# Patient Record
Sex: Male | Born: 1955 | Race: Black or African American | Hispanic: No | Smoking: Current every day smoker
Health system: Southern US, Community
[De-identification: ages and names within clinical notes are randomized; demographics above are authoritative.]

## PROBLEM LIST (undated history)

## (undated) DIAGNOSIS — J439 Emphysema, unspecified: Secondary | ICD-10-CM

## (undated) DIAGNOSIS — M549 Dorsalgia, unspecified: Secondary | ICD-10-CM

## (undated) DIAGNOSIS — C61 Malignant neoplasm of prostate: Secondary | ICD-10-CM

## (undated) DIAGNOSIS — R7303 Prediabetes: Secondary | ICD-10-CM

## (undated) DIAGNOSIS — E876 Hypokalemia: Secondary | ICD-10-CM

## (undated) DIAGNOSIS — J189 Pneumonia, unspecified organism: Secondary | ICD-10-CM

## (undated) DIAGNOSIS — R011 Cardiac murmur, unspecified: Secondary | ICD-10-CM

## (undated) DIAGNOSIS — B1920 Unspecified viral hepatitis C without hepatic coma: Secondary | ICD-10-CM

## (undated) DIAGNOSIS — I350 Nonrheumatic aortic (valve) stenosis: Secondary | ICD-10-CM

## (undated) DIAGNOSIS — I38 Endocarditis, valve unspecified: Secondary | ICD-10-CM

## (undated) DIAGNOSIS — I700 Atherosclerosis of aorta: Secondary | ICD-10-CM

## (undated) DIAGNOSIS — Z720 Tobacco use: Secondary | ICD-10-CM

## (undated) DIAGNOSIS — I10 Essential (primary) hypertension: Secondary | ICD-10-CM

## (undated) HISTORY — PX: BACK SURGERY: SHX140

---

## 2003-08-21 ENCOUNTER — Other Ambulatory Visit: Payer: Self-pay

## 2003-09-14 ENCOUNTER — Other Ambulatory Visit: Payer: Self-pay

## 2003-12-18 ENCOUNTER — Emergency Department: Payer: Self-pay | Admitting: Emergency Medicine

## 2003-12-18 ENCOUNTER — Other Ambulatory Visit: Payer: Self-pay

## 2005-02-27 ENCOUNTER — Emergency Department: Payer: Self-pay | Admitting: Emergency Medicine

## 2005-06-27 ENCOUNTER — Emergency Department: Payer: Self-pay | Admitting: Emergency Medicine

## 2006-08-17 ENCOUNTER — Emergency Department: Payer: Self-pay | Admitting: Emergency Medicine

## 2006-08-17 ENCOUNTER — Other Ambulatory Visit: Payer: Self-pay

## 2007-02-15 ENCOUNTER — Emergency Department: Payer: Self-pay | Admitting: Emergency Medicine

## 2008-06-14 ENCOUNTER — Ambulatory Visit: Payer: Self-pay | Admitting: Internal Medicine

## 2009-05-11 ENCOUNTER — Emergency Department: Payer: Self-pay | Admitting: Emergency Medicine

## 2010-08-15 ENCOUNTER — Emergency Department: Payer: Self-pay | Admitting: Emergency Medicine

## 2012-08-26 ENCOUNTER — Emergency Department (HOSPITAL_COMMUNITY): Payer: Medicaid Other

## 2012-08-26 ENCOUNTER — Encounter (HOSPITAL_COMMUNITY): Payer: Self-pay | Admitting: Emergency Medicine

## 2012-08-26 ENCOUNTER — Emergency Department (HOSPITAL_COMMUNITY)
Admission: EM | Admit: 2012-08-26 | Discharge: 2012-08-26 | Disposition: A | Discharge status: Home / Self Care | Payer: Medicaid Other | Attending: Emergency Medicine | Admitting: Emergency Medicine

## 2012-08-26 DIAGNOSIS — K6289 Other specified diseases of anus and rectum: Secondary | ICD-10-CM | POA: Insufficient documentation

## 2012-08-26 DIAGNOSIS — Z79899 Other long term (current) drug therapy: Secondary | ICD-10-CM | POA: Insufficient documentation

## 2012-08-26 DIAGNOSIS — Z8679 Personal history of other diseases of the circulatory system: Secondary | ICD-10-CM | POA: Insufficient documentation

## 2012-08-26 DIAGNOSIS — F17200 Nicotine dependence, unspecified, uncomplicated: Secondary | ICD-10-CM | POA: Insufficient documentation

## 2012-08-26 DIAGNOSIS — I10 Essential (primary) hypertension: Secondary | ICD-10-CM | POA: Insufficient documentation

## 2012-08-26 DIAGNOSIS — K625 Hemorrhage of anus and rectum: Secondary | ICD-10-CM

## 2012-08-26 DIAGNOSIS — I38 Endocarditis, valve unspecified: Secondary | ICD-10-CM | POA: Insufficient documentation

## 2012-08-26 DIAGNOSIS — C61 Malignant neoplasm of prostate: Secondary | ICD-10-CM | POA: Insufficient documentation

## 2012-08-26 DIAGNOSIS — R1032 Left lower quadrant pain: Secondary | ICD-10-CM | POA: Insufficient documentation

## 2012-08-26 DIAGNOSIS — Z7982 Long term (current) use of aspirin: Secondary | ICD-10-CM | POA: Insufficient documentation

## 2012-08-26 DIAGNOSIS — N400 Enlarged prostate without lower urinary tract symptoms: Secondary | ICD-10-CM | POA: Insufficient documentation

## 2012-08-26 HISTORY — DX: Malignant neoplasm of prostate: C61

## 2012-08-26 HISTORY — DX: Cardiac murmur, unspecified: R01.1

## 2012-08-26 HISTORY — DX: Essential (primary) hypertension: I10

## 2012-08-26 HISTORY — DX: Endocarditis, valve unspecified: I38

## 2012-08-26 LAB — URINALYSIS, ROUTINE W REFLEX MICROSCOPIC
Bilirubin Urine: NEGATIVE
Glucose, UA: NEGATIVE mg/dL
Ketones, ur: NEGATIVE mg/dL
pH: 6 (ref 5.0–8.0)

## 2012-08-26 LAB — COMPREHENSIVE METABOLIC PANEL
ALT: 69 U/L — ABNORMAL HIGH (ref 0–53)
Albumin: 3.8 g/dL (ref 3.5–5.2)
Alkaline Phosphatase: 100 U/L (ref 39–117)
Chloride: 101 mEq/L (ref 96–112)
GFR calc Af Amer: >90 mL/min (ref >90)
Glucose, Bld: 117 mg/dL — ABNORMAL HIGH (ref 70–99)
Potassium: 3.8 mEq/L (ref 3.5–5.1)
Sodium: 137 mEq/L (ref 135–145)
Total Bilirubin: 0.6 mg/dL (ref 0.3–1.2)
Total Protein: 7.6 g/dL (ref 6.0–8.3)

## 2012-08-26 LAB — CBC WITH DIFFERENTIAL/PLATELET
Eosinophils Absolute: 0.2 10*3/uL (ref 0.0–0.7)
Hemoglobin: 18.8 g/dL — ABNORMAL HIGH (ref 13.0–17.0)
Lymphs Abs: 2.9 10*3/uL (ref 0.7–4.0)
MCH: 29.8 pg (ref 26.0–34.0)
Neutro Abs: 3 10*3/uL (ref 1.7–7.7)
Neutrophils Relative %: 45 % (ref 43–77)
Platelets: 169 10*3/uL (ref 150–400)
RBC: 6.31 MIL/uL — ABNORMAL HIGH (ref 4.22–5.81)
WBC: 6.8 10*3/uL (ref 4.0–10.5)

## 2012-08-26 LAB — URINE MICROSCOPIC-ADD ON

## 2012-08-26 LAB — OCCULT BLOOD, POC DEVICE: Fecal Occult Bld: POSITIVE — AB

## 2012-08-26 MED ORDER — ONDANSETRON HCL 4 MG/2ML IJ SOLN
4.0000 mg | Freq: Once | INTRAMUSCULAR | Status: AC
  Administered 2012-08-26: 4 mg via INTRAVENOUS
  Filled 2012-08-26: qty 2

## 2012-08-26 MED ORDER — OXYCODONE-ACETAMINOPHEN 5-325 MG PO TABS: 1.0000 | ORAL_TABLET | Freq: Four times a day (QID) | ORAL | Status: DC | PRN

## 2012-08-26 MED ORDER — IOHEXOL 300 MG/ML  SOLN
50.0000 mL | Freq: Once | INTRAMUSCULAR | Status: AC | PRN
  Administered 2012-08-26: 50 mL via ORAL

## 2012-08-26 MED ORDER — ONDANSETRON HCL 4 MG PO TABS: 4.0000 mg | ORAL_TABLET | Freq: Four times a day (QID) | ORAL | Status: DC

## 2012-08-26 MED ORDER — IOHEXOL 300 MG/ML  SOLN
100.0000 mL | Freq: Once | INTRAMUSCULAR | Status: AC | PRN
  Administered 2012-08-26: 100 mL via INTRAVENOUS

## 2012-08-26 MED ORDER — MORPHINE SULFATE 4 MG/ML IJ SOLN
4.0000 mg | Freq: Once | INTRAMUSCULAR | Status: AC
  Administered 2012-08-26: 4 mg via INTRAVENOUS
  Filled 2012-08-26: qty 1

## 2012-08-26 NOTE — ED Provider Notes (Signed)
Medical screening examination/treatment/procedure(s) were performed by non-physician practitioner and as supervising physician I was immediately available for consultation/collaboration.  Detrich Rakestraw R. Emanual Lamountain, MD 08/26/12 2309 

## 2012-08-26 NOTE — ED Provider Notes (Signed)
History     CSN: [id number]  Arrival date & time 08/26/12  1355   First MD Initiated Contact with Patient 08/26/12 1424      Chief Complaint  Patient presents with  . Rectal Bleeding    (Consider location/radiation/quality/duration/timing/severity/associated sxs/prior treatment) HPI  Zachary Briggs is a 57 y.o.male presenting to the ER with complaints of rectal bleeding x two episodes with rectal pain. The first episode on Monday started when he was constipated and he noticed that there was some blood in the toilet, then today it happened again. He describes the pain as screws in his stomach. He is also having LLQ pain. He is not passing any blood when he is not going to the bathroom. He says that he has precancerous cells and BPH and is being treated at the [facility] by pain medication and close watching.  He is not on chemo or radiation. He has had no recent biopsy to his prostate. He denies weakness, n/v/d, fevers, recent weight loss.    Past Medical History  Diagnosis Date  . Hypertension   . Prostate cancer   . Heart valve disease   . Heart murmur     Past Surgical History  Procedure Laterality Date  . Back surgery      History reviewed. No pertinent family history.  History  Substance Use Topics  . Smoking status: Current Every Day Smoker    Types: Cigarettes  . Smokeless tobacco: Not on file  . Alcohol Use: 2.4 oz/week    4 Cans of beer per week      Review of Systems  Gastrointestinal: Positive for blood in stool and rectal pain.    Allergies  Review of patient's allergies indicates no known allergies.  Home Medications   Current Outpatient Rx  Name  Route  Sig  Dispense  Refill  . amLODipine (NORVASC) 10 MG tablet   Oral   Take 10 mg by mouth daily.         Marland Kitchen aspirin EC 81 MG tablet   Oral   Take 81 mg by mouth daily.         . diclofenac (VOLTAREN) 50 MG EC tablet   Oral   Take 50 mg by mouth 2 (two) times daily.         Marland Kitchen docusate sodium  (COLACE) 100 MG capsule   Oral   Take 100 mg by mouth 2 (two) times daily as needed for constipation.         . hydrochlorothiazide (HYDRODIURIL) 25 MG tablet   Oral   Take 25 mg by mouth daily.         Marland Kitchen lisinopril (PRINIVIL,ZESTRIL) 5 MG tablet   Oral   Take 2.5 mg by mouth daily.         Marland Kitchen morphine (MSIR) 15 MG tablet   Oral   Take 15 mg by mouth every 4 (four) hours as needed for pain.         Marland Kitchen ondansetron (ZOFRAN) 4 MG tablet   Oral   Take 1 tablet (4 mg total) by mouth every 6 (six) hours.   12 tablet   0   . oxyCODONE-acetaminophen (PERCOCET/ROXICET) 5-325 MG per tablet   Oral   Take 1 tablet by mouth every 6 (six) hours as needed for pain.   20 tablet   0     BP 135/85  Pulse 72  Temp(Src) 97.4 F (36.3 C) (Oral)  Resp 20  Ht 5\' 5"  (1.651  m)  Wt 164 lb (74.39 kg)  BMI 27.29 kg/m2  SpO2 98%  Physical Exam  Nursing note and vitals reviewed. Constitutional: He appears well-developed and well-nourished. No distress.  HENT:  Head: Normocephalic and atraumatic.  Eyes: Pupils are equal, round, and reactive to light.  Neck: Normal range of motion. Neck supple.  Cardiovascular: Normal rate and regular rhythm.   Pulmonary/Chest: Effort normal.  Abdominal: Soft. There is tenderness in the left lower quadrant. There is guarding. There is no rigidity, no rebound, no tenderness at McBurney's point and negative Murphy's sign.  Genitourinary: Rectum normal. Rectal exam shows no internal hemorrhoid, no mass, no tenderness and anal tone normal. Prostate is enlarged.  Neurological: He is alert.  Skin: Skin is warm and dry.    ED Course  Procedures (including critical care time)  Labs Reviewed  CBC WITH DIFFERENTIAL - Abnormal; Notable for the following:    RBC 6.31 (*)    Hemoglobin 18.8 (*)    HCT 54.6 (*)    RDW 15.6 (*)    All other components within normal limits  COMPREHENSIVE METABOLIC PANEL - Abnormal; Notable for the following:    Glucose, Bld  117 (*)    AST 48 (*)    ALT 69 (*)    All other components within normal limits  URINALYSIS, ROUTINE W REFLEX MICROSCOPIC - Abnormal; Notable for the following:    Leukocytes, UA SMALL (*)    All other components within normal limits  OCCULT BLOOD, POC DEVICE - Abnormal; Notable for the following:    Fecal Occult Bld POSITIVE (*)    All other components within normal limits  PROTIME-INR  URINE MICROSCOPIC-ADD ON  OCCULT BLOOD X 1 CARD TO LAB, STOOL   Ct Abdomen Pelvis W Contrast  08/26/2012   *RADIOLOGY REPORT*  Clinical Data: Rectal pain/bleeding, prostate cancer  CT ABDOMEN AND PELVIS WITH CONTRAST  Technique:  Multidetector CT imaging of the abdomen and pelvis was performed following the standard protocol during bolus administration of intravenous contrast.  Contrast: 100 ml Omnipaque-300 IV  Comparison: None.  Findings: Minimal dependent atelectasis at the right lung base.  Liver, spleen, pancreas, and left adrenal gland are within normal limits.  Minimal nodular thickening of the right adrenal gland (series 2/image 15), measuring approximately 11 mm, with washout characteristics compatible with an adrenal adenoma.  Gallbladder is unremarkable.  No intrahepatic or extrahepatic ductal dilatation.  Kidneys are within normal limits. No hydronephrosis.  No evidence of bowel obstruction.  Normal appendix.  Extensive colonic diverticulosis, without associated inflammatory changes.  Atherosclerotic calcifications of the abdominal aorta and branch vessels.  No abdominopelvic ascites.  No suspicious abdominopelvic lymphadenopathy.  Prostate is at the upper limits of normal for size, measuring 4.8 cm in transverse dimension.  No ureteral or bladder calculi.  Bladder is within normal limits.  Degenerative changes of the lumbar spine.  10 mm sclerotic lesion in the right iliac bone (series 2/image 56), nonspecific.  IMPRESSION: No evidence of bowel obstruction.  Normal appendix.  Extensive colonic  diverticulosis, without findings to suggest diverticulitis.  10 mm sclerotic lesion in the right iliac bone.  In the setting of prostate cancer, correlate with PSA and consider nuclear medicine bone scan as clinically warranted.  11 mm right adrenal adenoma.   Original Report Authenticated By: Charline Bills, M.D.     1. Rectal bleed       MDM   Patient work-up does not show any findings that require acute management. He is  not actively bleeding in ER. Bleeding occurs during bowel movement. Discussed stool softners and staying hydrated. Rx pain medication. Pt feels much better in the ED.  Discussed with DR. Pickering. [facility].  56 y.o.Bronislaw Noblet's evaluation in the Emergency Department is complete. It has been determined that no acute conditions requiring further emergency intervention are present at this time. The patient/guardian have been advised of the diagnosis and plan. We have discussed signs and symptoms that warrant return to the ED, such as changes or worsening in symptoms.  Vital signs are stable at discharge. Filed Vitals:   08/26/12 1715  BP:   Pulse: 61  Temp:   Resp: 18    Patient/guardian has voiced understanding and agreed to follow-up with the PCP or specialist.        Dorthula Matas, PA-C 08/26/12 1724

## 2012-08-26 NOTE — Progress Notes (Signed)
Pt confirms pcp is  [facility] per pt EPIC updated

## 2012-08-26 NOTE — ED Notes (Signed)
Pt finished with oral contrast.  

## 2012-08-26 NOTE — ED Notes (Addendum)
Pt states he has prostate cancer c/o rectal bleeding and pain in rectum.

## 2012-08-26 NOTE — ED Notes (Signed)
Pt aware of the need for a urine sample. Urinal at bedside. Pt unable to void at this time.

## 2012-08-31 ENCOUNTER — Encounter (HOSPITAL_COMMUNITY): Payer: Self-pay | Admitting: *Deleted

## 2012-08-31 ENCOUNTER — Emergency Department (HOSPITAL_COMMUNITY)
Admission: EM | Admit: 2012-08-31 | Discharge: 2012-08-31 | Disposition: A | Discharge status: Home / Self Care | Payer: Medicaid Other | Attending: Emergency Medicine | Admitting: Emergency Medicine

## 2012-08-31 DIAGNOSIS — Z791 Long term (current) use of non-steroidal anti-inflammatories (NSAID): Secondary | ICD-10-CM | POA: Insufficient documentation

## 2012-08-31 DIAGNOSIS — R1032 Left lower quadrant pain: Secondary | ICD-10-CM | POA: Insufficient documentation

## 2012-08-31 DIAGNOSIS — R011 Cardiac murmur, unspecified: Secondary | ICD-10-CM | POA: Insufficient documentation

## 2012-08-31 DIAGNOSIS — R109 Unspecified abdominal pain: Secondary | ICD-10-CM

## 2012-08-31 DIAGNOSIS — F17200 Nicotine dependence, unspecified, uncomplicated: Secondary | ICD-10-CM | POA: Insufficient documentation

## 2012-08-31 DIAGNOSIS — Z7982 Long term (current) use of aspirin: Secondary | ICD-10-CM | POA: Insufficient documentation

## 2012-08-31 DIAGNOSIS — Z8679 Personal history of other diseases of the circulatory system: Secondary | ICD-10-CM | POA: Insufficient documentation

## 2012-08-31 DIAGNOSIS — Z8546 Personal history of malignant neoplasm of prostate: Secondary | ICD-10-CM | POA: Insufficient documentation

## 2012-08-31 DIAGNOSIS — Z79899 Other long term (current) drug therapy: Secondary | ICD-10-CM | POA: Insufficient documentation

## 2012-08-31 DIAGNOSIS — I10 Essential (primary) hypertension: Secondary | ICD-10-CM | POA: Insufficient documentation

## 2012-08-31 DIAGNOSIS — G8929 Other chronic pain: Secondary | ICD-10-CM | POA: Insufficient documentation

## 2012-08-31 MED ORDER — OXYCODONE-ACETAMINOPHEN 5-325 MG PO TABS: 1.0000 | ORAL_TABLET | Freq: Four times a day (QID) | ORAL | Status: DC | PRN

## 2012-08-31 MED ORDER — ONDANSETRON 8 MG PO TBDP: 8.0000 mg | ORAL_TABLET | Freq: Three times a day (TID) | ORAL | Status: DC | PRN

## 2012-08-31 NOTE — ED Provider Notes (Signed)
History    CSN: [id number] Arrival date & time 08/31/12  1623  First MD Initiated Contact with Patient 08/31/12 1639     No chief complaint on file.  (Consider location/radiation/quality/duration/timing/severity/associated sxs/prior Treatment) HPI Comments: Pt comes in with cc of abd pain. Pt has hx of prostate CA, HTN. States that he has chronic abd pain, beign treated with oro morph 15 mg bid by [place]. Pt started having worsening abd pain 1 week ago, was seen by [place], CT was neg acute process, and patient was discharged with percocet. He returns to the ED as he ran out of pain meds. Pt has persistent abd pain, same type, on the left side. He has an appt set on July 7. Pt is scheduled for colonoscopy later this year as well. No n/v/f/c/uti like sx.   The history is provided by the patient.   Past Medical History  Diagnosis Date  . Hypertension   . Prostate cancer   . Heart valve disease   . Heart murmur    Past Surgical History  Procedure Laterality Date  . Back surgery     No family history on file. History  Substance Use Topics  . Smoking status: Current Every Day Smoker    Types: Cigarettes  . Smokeless tobacco: Not on file  . Alcohol Use: 2.4 oz/week    4 Cans of beer per week    Review of Systems  Constitutional: Negative for activity change and appetite change.  Respiratory: Negative for cough and shortness of breath.   Cardiovascular: Negative for chest pain.  Gastrointestinal: Positive for abdominal pain. Negative for nausea, vomiting and diarrhea.  Genitourinary: Negative for dysuria.    Allergies  Review of patient's allergies indicates no known allergies.  Home Medications   Current Outpatient Rx  Name  Route  Sig  Dispense  Refill  . amLODipine (NORVASC) 10 MG tablet   Oral   Take 10 mg by mouth daily.         Marland Kitchen aspirin EC 81 MG tablet   Oral   Take 81 mg by mouth daily.         . diclofenac (VOLTAREN) 50 MG EC tablet   Oral   Take 50 mg by  mouth 2 (two) times daily.         Marland Kitchen docusate sodium (COLACE) 100 MG capsule   Oral   Take 100 mg by mouth 2 (two) times daily as needed for constipation.         . hydrochlorothiazide (HYDRODIURIL) 25 MG tablet   Oral   Take 25 mg by mouth daily.         Marland Kitchen lisinopril (PRINIVIL,ZESTRIL) 5 MG tablet   Oral   Take 2.5 mg by mouth daily.         Marland Kitchen morphine (MSIR) 15 MG tablet   Oral   Take 15 mg by mouth every 4 (four) hours as needed for pain.         Marland Kitchen ondansetron (ZOFRAN) 4 MG tablet   Oral   Take 1 tablet (4 mg total) by mouth every 6 (six) hours.   12 tablet   0   . oxyCODONE-acetaminophen (PERCOCET/ROXICET) 5-325 MG per tablet   Oral   Take 1 tablet by mouth every 6 (six) hours as needed for pain.   20 tablet   0   . ondansetron (ZOFRAN ODT) 8 MG disintegrating tablet   Oral   Take 1 tablet (8 mg total) by  mouth every 8 (eight) hours as needed for nausea.   20 tablet   0   . oxyCODONE-acetaminophen (PERCOCET/ROXICET) 5-325 MG per tablet   Oral   Take 1 tablet by mouth every 6 (six) hours as needed for pain.   20 tablet   0    BP 134/82  Pulse 80  Temp(Src) 98.2 F (36.8 C) (Oral)  Resp 18  SpO2 96% Physical Exam  Nursing note and vitals reviewed. Constitutional: He is oriented to person, place, and time. He appears well-developed.  HENT:  Head: Normocephalic and atraumatic.  Eyes: Conjunctivae and EOM are normal. Pupils are equal, round, and reactive to light.  Neck: Normal range of motion. Neck supple.  Cardiovascular: Normal rate and regular rhythm.   Pulmonary/Chest: Effort normal and breath sounds normal.  Abdominal: Soft. Bowel sounds are normal. He exhibits no distension. There is tenderness. There is no rebound and no guarding.  LLQ tenderness, no rebound or guarding.  Neurological: He is alert and oriented to person, place, and time.  Skin: Skin is warm.    ED Course  Procedures (including critical care time) Labs Reviewed - No  data to display No results found. 1. Abdominal pain     MDM  Pt comes in with cc of abd pain. Pt has complex pain- he is on high dose pain meds on the routine. We did a NCCSR survey - and it is appreciated that patient gets 100 morphine tabs every month - all from [place], but not this month. He has not gone anywhere besides [place] and the visit here for pain meds. So wewill give him the benefit of doubt and give more pain meds.  I stressed the importance of PCP f/u - and GI f/u for possible colonoscopy - since he has had rectal bleeds, and has prostate CA hx. He understands.   Derwood Kaplan, MD 08/31/12 1805

## 2012-08-31 NOTE — ED Notes (Signed)
Pt reports to ed with c/o abdominal pain, pt reports was seen here last week for same and was given medications however he ran out of his meds and came back to get more. Pt sts he will see his pcp on July 7th. Pt reports LLQ abdominal pain, no n/v/d.

## 2014-10-02 ENCOUNTER — Emergency Department (HOSPITAL_COMMUNITY)
Admission: EM | Admit: 2014-10-02 | Discharge: 2014-10-02 | Disposition: A | Discharge status: Home / Self Care | Payer: Medicaid Other | Attending: Emergency Medicine | Admitting: Emergency Medicine

## 2014-10-02 ENCOUNTER — Encounter (HOSPITAL_COMMUNITY): Payer: Self-pay | Admitting: Emergency Medicine

## 2014-10-02 ENCOUNTER — Emergency Department (HOSPITAL_COMMUNITY): Payer: Medicaid Other

## 2014-10-02 DIAGNOSIS — Z7982 Long term (current) use of aspirin: Secondary | ICD-10-CM | POA: Diagnosis not present

## 2014-10-02 DIAGNOSIS — R011 Cardiac murmur, unspecified: Secondary | ICD-10-CM | POA: Insufficient documentation

## 2014-10-02 DIAGNOSIS — Z79899 Other long term (current) drug therapy: Secondary | ICD-10-CM | POA: Diagnosis not present

## 2014-10-02 DIAGNOSIS — I10 Essential (primary) hypertension: Secondary | ICD-10-CM | POA: Diagnosis not present

## 2014-10-02 DIAGNOSIS — M25552 Pain in left hip: Secondary | ICD-10-CM | POA: Diagnosis not present

## 2014-10-02 DIAGNOSIS — Z791 Long term (current) use of non-steroidal anti-inflammatories (NSAID): Secondary | ICD-10-CM | POA: Diagnosis not present

## 2014-10-02 DIAGNOSIS — M79605 Pain in left leg: Secondary | ICD-10-CM | POA: Insufficient documentation

## 2014-10-02 DIAGNOSIS — Z720 Tobacco use: Secondary | ICD-10-CM | POA: Diagnosis not present

## 2014-10-02 DIAGNOSIS — Z8546 Personal history of malignant neoplasm of prostate: Secondary | ICD-10-CM | POA: Diagnosis not present

## 2014-10-02 MED ORDER — METAXALONE 800 MG PO TABS: 400.0000 mg | ORAL_TABLET | Freq: Three times a day (TID) | ORAL | Status: DC

## 2014-10-02 MED ORDER — METAXALONE 800 MG PO TABS
800.0000 mg | ORAL_TABLET | ORAL | Status: AC
Start: 2014-10-02 — End: 2014-10-02
  Administered 2014-10-02: 800 mg via ORAL
  Filled 2014-10-02: qty 1

## 2014-10-02 MED ORDER — KETOROLAC TROMETHAMINE 30 MG/ML IJ SOLN
30.0000 mg | Freq: Once | INTRAMUSCULAR | Status: AC
  Administered 2014-10-02: 30 mg via INTRAMUSCULAR
  Filled 2014-10-02: qty 1

## 2014-10-02 MED ORDER — OXYCODONE HCL 10 MG PO TABS
5.0000 mg | ORAL_TABLET | ORAL | Status: DC | PRN
Start: 2014-10-02 — End: 2015-12-30

## 2014-10-02 MED ORDER — OXYCODONE HCL 5 MG PO TABS
10.0000 mg | ORAL_TABLET | ORAL | Status: AC
  Administered 2014-10-02: 10 mg via ORAL
  Filled 2014-10-02: qty 2

## 2014-10-02 MED ORDER — HYDROMORPHONE HCL 1 MG/ML IJ SOLN
1.0000 mg | Freq: Once | INTRAMUSCULAR | Status: AC
  Administered 2014-10-02: 1 mg via INTRAMUSCULAR
  Filled 2014-10-02: qty 1

## 2014-10-02 NOTE — ED Provider Notes (Signed)
CSN: [id number]     Arrival date & time 10/02/14  0011 History   First MD Initiated Contact with Patient 10/02/14 0020     Chief Complaint  Patient presents with  . Leg Pain     (Consider location/radiation/quality/duration/timing/severity/associated sxs/prior Treatment) HPI Comments: Patient finished a 28 day course of radiation for prostate cancer on July 18th.  Since that time.  He has been having excruciating pain in his left hip radiating down his left leg all the way to his toes.  Has not taken anything for pain nor called his physician.  He was receiving his treatments at the [facility] hospital.  He denies any fall or trauma to the area or previous pain  Patient is a 59 y.o. male presenting with leg pain. The history is provided by the patient.  Leg Pain Location:  Hip and leg Time since incident:  5 days Hip location:  L hip Leg location:  L leg Pain details:    Quality:  Aching   Radiates to:  L leg   Severity:  Severe   Onset quality:  Gradual   Duration:  5 days   Timing:  Constant   Progression:  Worsening Chronicity:  New Dislocation: no   Prior injury to area:  No Relieved by:  None tried Worsened by:  Nothing tried Ineffective treatments:  None tried Associated symptoms: decreased ROM   Associated symptoms: no numbness, no swelling and no tingling   Risk factors comment:  Prostate cancer with radiation treatments   Past Medical History  Diagnosis Date  . Hypertension   . Prostate cancer   . Heart valve disease   . Heart murmur    Past Surgical History  Procedure Laterality Date  . Back surgery     No family history on file. History  Substance Use Topics  . Smoking status: Current Every Day Smoker    Types: Cigarettes  . Smokeless tobacco: Not on file  . Alcohol Use: 2.4 oz/week    4 Cans of beer per week    Review of Systems    Allergies  Review of patient's allergies indicates no known allergies.  Home Medications   Prior to Admission  medications   Medication Sig Start Date End Date Taking? Authorizing Provider  amLODipine (NORVASC) 10 MG tablet Take 10 mg by mouth daily.    Historical Provider, MD  aspirin EC 81 MG tablet Take 81 mg by mouth daily.    Historical Provider, MD  diclofenac (VOLTAREN) 50 MG EC tablet Take 50 mg by mouth 2 (two) times daily.    Historical Provider, MD  docusate sodium (COLACE) 100 MG capsule Take 100 mg by mouth 2 (two) times daily as needed for constipation.    Historical Provider, MD  hydrochlorothiazide (HYDRODIURIL) 25 MG tablet Take 25 mg by mouth daily.    Historical Provider, MD  lisinopril (PRINIVIL,ZESTRIL) 5 MG tablet Take 2.5 mg by mouth daily.    Historical Provider, MD  metaxalone (SKELAXIN) 800 MG tablet Take 0.5 tablets (400 mg total) by mouth 3 (three) times daily. 10/02/14   Junius Creamer, NP  morphine (MSIR) 15 MG tablet Take 15 mg by mouth every 4 (four) hours as needed for pain.    Historical Provider, MD  ondansetron (ZOFRAN ODT) 8 MG disintegrating tablet Take 1 tablet (8 mg total) by mouth every 8 (eight) hours as needed for nausea. 08/31/12   Varney Biles, MD  ondansetron (ZOFRAN) 4 MG tablet Take 1 tablet (4 mg  total) by mouth every 6 (six) hours. 08/26/12   Tiffany Carlota Raspberry, PA-C  oxyCODONE 10 MG TABS Take 0.5 tablets (5 mg total) by mouth every 4 (four) hours as needed for severe pain. 10/02/14   Junius Creamer, NP  oxyCODONE-acetaminophen (PERCOCET/ROXICET) 5-325 MG per tablet Take 1 tablet by mouth every 6 (six) hours as needed for pain. 08/26/12   Delos Haring, PA-C  oxyCODONE-acetaminophen (PERCOCET/ROXICET) 5-325 MG per tablet Take 1 tablet by mouth every 6 (six) hours as needed for pain. 08/31/12   Ankit Nanavati, MD   BP 176/98 mmHg  Pulse 68  Temp(Src) 97.9 F (36.6 C) (Oral)  Resp 18  Ht 5\' 6"  (1.676 m)  Wt 157 lb (71.215 kg)  BMI 25.35 kg/m2  SpO2 97% Physical Exam  Constitutional: He appears well-developed and well-nourished. He appears distressed.  HENT:    Head: Normocephalic.  Eyes: Pupils are equal, round, and reactive to light.  Neck: Normal range of motion.  Cardiovascular: Normal rate and regular rhythm.   Pulmonary/Chest: Effort normal and breath sounds normal.  Abdominal: Soft. He exhibits no distension. There is no tenderness.  Musculoskeletal: He exhibits tenderness. He exhibits no edema.       Left hip: He exhibits decreased range of motion and tenderness. He exhibits no swelling, no crepitus, no deformity and no laceration.       Legs: Neurological: He is alert.  Skin: Skin is warm. No rash noted. No erythema.  Vitals reviewed.   ED Course  Procedures (including critical care time) Labs Review Labs Reviewed - No data to display  Imaging Review Dg Lumbar Spine Complete  10/02/2014   CLINICAL DATA:  Left leg pain for several days.  EXAM: LUMBAR SPINE - COMPLETE 4+ VIEW  COMPARISON:  None.  FINDINGS: There is straightening of the lumbar lordosis. There is no evidence of lumbar spine fracture. Alignment is normal. Moderate degenerative disc changes are present, greatest at L2-3 and L3-4. No bone lesions or bony destruction evident.  IMPRESSION: Degenerative disc changes. Negative for fracture or skeletal metastasis.   Electronically Signed   By: Andreas Newport M.D.   On: 10/02/2014 01:43   Dg Pelvis 1-2 Views  10/02/2014   CLINICAL DATA:  Left leg pain for several days. Status post radiation of the prostate gland.  EXAM: PELVIS - 1-2 VIEW  COMPARISON:  CT abdomen/pelvis 08/26/2012  FINDINGS: The cortical margins of the bony pelvis are intact. The peripherally sclerotic lesion in the right iliac bone on prior CT is not well seen radiographically. No evidence of destructive lytic lesion. No fracture. Pubic symphysis and sacroiliac joints are congruent. Both femoral heads are well-seated in the respective acetabula. Brachytherapy seeds noted in the prostate gland.  IMPRESSION: No radiographic finding of acute osseous abnormality.    Electronically Signed   By: Jeb Levering M.D.   On: 10/02/2014 01:44     EKG Interpretation None     Patient has been given prescriptions for oxycodone, Skelaxin.  He is also been provided with crutches to assist with ambulation.  He is to follow-up with his primary care physician to discuss this new symptom MDM   Final diagnoses:  Hip pain, acute, left         Junius Creamer, NP 10/02/14 [place], MD 10/02/14 [phone]

## 2014-10-02 NOTE — Discharge Instructions (Signed)
Arthralgia Arthralgia is joint pain. A joint is a place where two bones meet. Joint pain can happen for many reasons. The joint can be bruised, stiff, infected, or weak from aging. Pain usually goes away after resting and taking medicine for soreness.  HOME CARE  Rest the joint as told by your doctor.  Keep the sore joint raised (elevated) for the first 24 hours.  Put ice on the joint area.  Put ice in a plastic bag.  Place a towel between your skin and the bag.  Leave the ice on for 15-20 minutes, 03-04 times a day.  Wear your splint, casting, elastic bandage, or sling as told by your doctor.  Only take medicine as told by your doctor. Do not take aspirin.  Use crutches as told by your doctor. Do not put weight on the joint until told to by your doctor. GET HELP RIGHT AWAY IF:   You have bruising, puffiness (swelling), or more pain.  Your fingers or toes turn blue or start to lose feeling (numb).  Your medicine does not lessen the pain.  Your pain becomes severe.  You have a temperature by mouth above 102 F (38.9 C), not controlled by medicine.  You cannot move or use the joint. MAKE SURE YOU:   Understand these instructions.  Will watch your condition.  Will get help right away if you are not doing well or get worse. Document Released: 02/12/2009 Document Revised: 05/19/2011 Document Reviewed: 02/12/2009 [facility] Patient Information 2015 [place], [place]. This information is not intended to replace advice given to you by your health care provider. Make sure you discuss any questions you have with your health care provider. You've been given prescription for oxycodone and Skelaxin one is in narcotic pain reliever.  The other end.  Muscle relaxer.  Please take these for severe pain.  I would like you to take the Skelaxin.  The muscle relaxer on a regular basis.  I would like you to use the oxycodone sparingly.  Please add ibuprofen to your medication regime.  Please make  an appointment with your primary care physician to discuss this new finding

## 2014-10-02 NOTE — ED Notes (Signed)
Patient with leg pain on the left.  Patient states that it started after radiation on Wednesday.  Patient is unable to walk on his left leg due to the pain.  So swelling noted to left calf.

## 2014-10-02 NOTE — ED Notes (Signed)
Pt. Left with all belongings. Discharge instructions were reviewed and all questions were answered.  

## 2014-10-06 ENCOUNTER — Emergency Department (HOSPITAL_COMMUNITY)
Admission: EM | Admit: 2014-10-06 | Discharge: 2014-10-06 | Disposition: A | Discharge status: Home / Self Care | Payer: Medicaid Other | Attending: Emergency Medicine | Admitting: Emergency Medicine

## 2014-10-06 ENCOUNTER — Encounter (HOSPITAL_COMMUNITY): Payer: Self-pay | Admitting: Emergency Medicine

## 2014-10-06 DIAGNOSIS — M5432 Sciatica, left side: Secondary | ICD-10-CM | POA: Diagnosis not present

## 2014-10-06 DIAGNOSIS — Z720 Tobacco use: Secondary | ICD-10-CM | POA: Diagnosis not present

## 2014-10-06 DIAGNOSIS — Z791 Long term (current) use of non-steroidal anti-inflammatories (NSAID): Secondary | ICD-10-CM | POA: Diagnosis not present

## 2014-10-06 DIAGNOSIS — I10 Essential (primary) hypertension: Secondary | ICD-10-CM | POA: Diagnosis not present

## 2014-10-06 DIAGNOSIS — Z7982 Long term (current) use of aspirin: Secondary | ICD-10-CM | POA: Diagnosis not present

## 2014-10-06 DIAGNOSIS — Z79899 Other long term (current) drug therapy: Secondary | ICD-10-CM | POA: Diagnosis not present

## 2014-10-06 DIAGNOSIS — M79605 Pain in left leg: Secondary | ICD-10-CM | POA: Diagnosis present

## 2014-10-06 DIAGNOSIS — R011 Cardiac murmur, unspecified: Secondary | ICD-10-CM | POA: Insufficient documentation

## 2014-10-06 DIAGNOSIS — Z8546 Personal history of malignant neoplasm of prostate: Secondary | ICD-10-CM | POA: Diagnosis not present

## 2014-10-06 MED ORDER — TRAMADOL HCL 50 MG PO TABS: 50.0000 mg | ORAL_TABLET | Freq: Four times a day (QID) | ORAL | Status: DC | PRN

## 2014-10-06 NOTE — ED Notes (Signed)
Pt. Stated, Im still having left leg paijn since July 20, I was here earlier and given medication and the muscle relaxant I couldn't afford.

## 2014-10-06 NOTE — ED Provider Notes (Signed)
CSN: [id number]     Arrival date & time 10/06/14  1025 History  This chart was scribed for non-physician practitioner, Montine Circle, PA-C, working with Tanna Furry, MD by Ladene Artist, ED Scribe. This patient was seen in room TR10C/TR10C and the patient's care was started at 10:40 AM.   Chief Complaint  Patient presents with  . Leg Pain   The history is provided by the patient. No language interpreter was used.   HPI Comments: Zachary Briggs is a 59 y.o. male, with a h/o HTN and prostate CA, who presents to the Emergency Department with a chief complaint of constant left leg pain for the past 9 days. Pt states that pain initially started when he started radiation therapy for prostate CA, but has been constant for the past 9 days. He describes pain as an aching sensation that radiates from his left hip into his left foot and is exacerbated with movement. Pt was seen on 10/02/14 for the same; XRs obtained that were normal. He was prescribed Oxycodone which has provided temporary relief and Skelaxin which he did not fill due to finances. Pt has an upcoming appointment with his PCP at [facility] hospital on 10/12/14. No h/o DM.   Past Medical History  Diagnosis Date  . Hypertension   . Prostate cancer   . Heart valve disease   . Heart murmur    Past Surgical History  Procedure Laterality Date  . Back surgery     No family history on file. History  Substance Use Topics  . Smoking status: Current Every Day Smoker    Types: Cigarettes  . Smokeless tobacco: Not on file  . Alcohol Use: 2.4 oz/week    4 Cans of beer per week    Review of Systems  Constitutional: Negative for fever and chills.  Gastrointestinal:       No bowel incontinence  Genitourinary:       No urinary incontinence  Musculoskeletal: Positive for myalgias, back pain and arthralgias.  Neurological:       No saddle anesthesia   Allergies  Review of patient's allergies indicates no known allergies.  Home Medications    Prior to Admission medications   Medication Sig Start Date End Date Taking? Authorizing Provider  amLODipine (NORVASC) 10 MG tablet Take 10 mg by mouth daily.    Historical Provider, MD  aspirin EC 81 MG tablet Take 81 mg by mouth daily.    Historical Provider, MD  diclofenac (VOLTAREN) 50 MG EC tablet Take 50 mg by mouth 2 (two) times daily.    Historical Provider, MD  docusate sodium (COLACE) 100 MG capsule Take 100 mg by mouth 2 (two) times daily as needed for constipation.    Historical Provider, MD  hydrochlorothiazide (HYDRODIURIL) 25 MG tablet Take 25 mg by mouth daily.    Historical Provider, MD  lisinopril (PRINIVIL,ZESTRIL) 5 MG tablet Take 2.5 mg by mouth daily.    Historical Provider, MD  metaxalone (SKELAXIN) 800 MG tablet Take 0.5 tablets (400 mg total) by mouth 3 (three) times daily. 10/02/14   Junius Creamer, NP  morphine (MSIR) 15 MG tablet Take 15 mg by mouth every 4 (four) hours as needed for pain.    Historical Provider, MD  ondansetron (ZOFRAN ODT) 8 MG disintegrating tablet Take 1 tablet (8 mg total) by mouth every 8 (eight) hours as needed for nausea. 08/31/12   Varney Biles, MD  ondansetron (ZOFRAN) 4 MG tablet Take 1 tablet (4 mg total) by mouth  every 6 (six) hours. 08/26/12   Tiffany Carlota Raspberry, PA-C  oxyCODONE 10 MG TABS Take 0.5 tablets (5 mg total) by mouth every 4 (four) hours as needed for severe pain. 10/02/14   Junius Creamer, NP  oxyCODONE-acetaminophen (PERCOCET/ROXICET) 5-325 MG per tablet Take 1 tablet by mouth every 6 (six) hours as needed for pain. 08/26/12   Delos Haring, PA-C  oxyCODONE-acetaminophen (PERCOCET/ROXICET) 5-325 MG per tablet Take 1 tablet by mouth every 6 (six) hours as needed for pain. 08/31/12   Ankit Nanavati, MD   BP 158/87 mmHg  Pulse 72  Temp(Src) 98.2 F (36.8 C) (Oral)  Resp 17  Ht 5\' 6"  (1.676 m)  Wt 154 lb (69.854 kg)  BMI 24.87 kg/m2  SpO2 98% Physical Exam  Constitutional: He is oriented to person, place, and time. He appears  well-developed and well-nourished. No distress.  HENT:  Head: Normocephalic and atraumatic.  Eyes: Conjunctivae and EOM are normal. Right eye exhibits no discharge. Left eye exhibits no discharge. No scleral icterus.  Neck: Normal range of motion. Neck supple. No tracheal deviation present.  Cardiovascular: Normal rate, regular rhythm and normal heart sounds.  Exam reveals no gallop and no friction rub.   No murmur heard. Pulmonary/Chest: Effort normal and breath sounds normal. No respiratory distress. He has no wheezes.  Abdominal: Soft. He exhibits no distension. There is no tenderness.  Musculoskeletal: Normal range of motion.  Left lumbar paraspinal muscles tender to palpation, no bony tenderness, step-offs, or gross abnormality or deformity of spine, patient is able to ambulate, moves all extremities  Bilateral great toe extension intact Bilateral plantar/dorsiflexion intact  Neurological: He is alert and oriented to person, place, and time. He has normal reflexes.  Sensation and strength intact bilaterally Symmetrical reflexes  Skin: Skin is warm and dry. He is not diaphoretic.  Psychiatric: He has a normal mood and affect. His behavior is normal. Judgment and thought content normal.  Nursing note and vitals reviewed.  ED Course  Procedures (including critical care time) DIAGNOSTIC STUDIES: Oxygen Saturation is 98% on RA, normal by my interpretation.    COORDINATION OF CARE: 10:47 AM-Discussed treatment plan which includes follow-up with specialist with pt at bedside and pt agreed to plan.   Labs Review Labs Reviewed - No data to display  Imaging Review No results found.   EKG Interpretation None      MDM   Final diagnoses:  Sciatica, left    Patient with back pain.  No neurological deficits and normal neuro exam.  Patient is ambulatory.  No loss of bowel or bladder control.  Doubt cauda equina.  Denies fever,  doubt epidural abscess or other lesion. Recommend back  exercises, stretching, RICE, and will treat with a short course of ultram.  Patient may benefit from PT.  Encouraged the patient that there could be a need for additional workup and/or imaging such as MRI, if the symptoms do not resolve. Patient advised that if the back pain does not resolve, or radiates, this could progress to more serious conditions and is encouraged to follow-up with PCP or orthopedics within 2 weeks.     I personally performed the services described in this documentation, which was scribed in my presence. The recorded information has been reviewed and is accurate.      Montine Circle, PA-C 10/06/14 1054  Tanna Furry, MD 10/13/14 [phone]

## 2014-10-06 NOTE — Discharge Instructions (Signed)
Sciatica Sciatica is pain, weakness, numbness, or tingling along the path of the sciatic nerve. The nerve starts in the lower back and runs down the back of each leg. The nerve controls the muscles in the lower leg and in the back of the knee, while also providing sensation to the back of the thigh, lower leg, and the sole of your foot. Sciatica is a symptom of another medical condition. For instance, nerve damage or certain conditions, such as a herniated disk or bone spur on the spine, pinch or put pressure on the sciatic nerve. This causes the pain, weakness, or other sensations normally associated with sciatica. Generally, sciatica only affects one side of the body. CAUSES   Herniated or slipped disc.  Degenerative disk disease.  A pain disorder involving the narrow muscle in the buttocks (piriformis syndrome).  Pelvic injury or fracture.  Pregnancy.  Tumor (rare). SYMPTOMS  Symptoms can vary from mild to very severe. The symptoms usually travel from the low back to the buttocks and down the back of the leg. Symptoms can include:  Mild tingling or dull aches in the lower back, leg, or hip.  Numbness in the back of the calf or sole of the foot.  Burning sensations in the lower back, leg, or hip.  Sharp pains in the lower back, leg, or hip.  Leg weakness.  Severe back pain inhibiting movement. These symptoms may get worse with coughing, sneezing, laughing, or prolonged sitting or standing. Also, being overweight may worsen symptoms. DIAGNOSIS  Your caregiver will perform a physical exam to look for common symptoms of sciatica. He or she may ask you to do certain movements or activities that would trigger sciatic nerve pain. Other tests may be performed to find the cause of the sciatica. These may include:  Blood tests.  X-rays.  Imaging tests, such as an MRI or CT scan. TREATMENT  Treatment is directed at the cause of the sciatic pain. Sometimes, treatment is not necessary  and the pain and discomfort goes away on its own. If treatment is needed, your caregiver may suggest:  Over-the-counter medicines to relieve pain.  Prescription medicines, such as anti-inflammatory medicine, muscle relaxants, or narcotics.  Applying heat or ice to the painful area.  Steroid injections to lessen pain, irritation, and inflammation around the nerve.  Reducing activity during periods of pain.  Exercising and stretching to strengthen your abdomen and improve flexibility of your spine. Your caregiver may suggest losing weight if the extra weight makes the back pain worse.  Physical therapy.  Surgery to eliminate what is pressing or pinching the nerve, such as a bone spur or part of a herniated disk. HOME CARE INSTRUCTIONS   Only take over-the-counter or prescription medicines for pain or discomfort as directed by your caregiver.  Apply ice to the affected area for 20 minutes, 3-4 times a day for the first 48-72 hours. Then try heat in the same way.  Exercise, stretch, or perform your usual activities if these do not aggravate your pain.  Attend physical therapy sessions as directed by your caregiver.  Keep all follow-up appointments as directed by your caregiver.  Do not wear high heels or shoes that do not provide proper support.  Check your mattress to see if it is too soft. A firm mattress may lessen your pain and discomfort. SEEK IMMEDIATE MEDICAL CARE IF:   You lose control of your bowel or bladder (incontinence).  You have increasing weakness in the lower back, pelvis, buttocks,   or legs.  You have redness or swelling of your back.  You have a burning sensation when you urinate.  You have pain that gets worse when you lie down or awakens you at night.  Your pain is worse than you have experienced in the past.  Your pain is lasting longer than 4 weeks.  You are suddenly losing weight without reason. MAKE SURE YOU:  Understand these  instructions.  Will watch your condition.  Will get help right away if you are not doing well or get worse. Document Released: 02/18/2001 Document Revised: 08/26/2011 Document Reviewed: 07/06/2011 Exit[employer] Patient Information 2015 [employer]. This information is not intended to replace advice given to you by your health care provider. Make sure you discuss any questions you have with your health care provider.  

## 2015-12-28 ENCOUNTER — Encounter (HOSPITAL_COMMUNITY): Payer: Self-pay | Admitting: Nurse Practitioner

## 2015-12-28 ENCOUNTER — Observation Stay (HOSPITAL_COMMUNITY)
Admission: EM | Admit: 2015-12-28 | Discharge: 2015-12-30 | Disposition: A | Discharge status: Home / Self Care | Payer: Medicaid Other | Attending: Internal Medicine | Admitting: Internal Medicine

## 2015-12-28 ENCOUNTER — Observation Stay (HOSPITAL_COMMUNITY): Payer: Medicaid Other

## 2015-12-28 ENCOUNTER — Emergency Department (HOSPITAL_COMMUNITY): Payer: Medicaid Other

## 2015-12-28 DIAGNOSIS — R7303 Prediabetes: Secondary | ICD-10-CM | POA: Diagnosis present

## 2015-12-28 DIAGNOSIS — R079 Chest pain, unspecified: Secondary | ICD-10-CM | POA: Diagnosis not present

## 2015-12-28 DIAGNOSIS — J439 Emphysema, unspecified: Secondary | ICD-10-CM | POA: Diagnosis present

## 2015-12-28 DIAGNOSIS — F17210 Nicotine dependence, cigarettes, uncomplicated: Secondary | ICD-10-CM | POA: Insufficient documentation

## 2015-12-28 DIAGNOSIS — Z79899 Other long term (current) drug therapy: Secondary | ICD-10-CM | POA: Insufficient documentation

## 2015-12-28 DIAGNOSIS — I350 Nonrheumatic aortic (valve) stenosis: Secondary | ICD-10-CM

## 2015-12-28 DIAGNOSIS — I700 Atherosclerosis of aorta: Secondary | ICD-10-CM | POA: Diagnosis present

## 2015-12-28 DIAGNOSIS — Z7982 Long term (current) use of aspirin: Secondary | ICD-10-CM | POA: Diagnosis not present

## 2015-12-28 DIAGNOSIS — M549 Dorsalgia, unspecified: Secondary | ICD-10-CM | POA: Diagnosis present

## 2015-12-28 DIAGNOSIS — Z720 Tobacco use: Secondary | ICD-10-CM | POA: Diagnosis present

## 2015-12-28 DIAGNOSIS — M545 Low back pain: Secondary | ICD-10-CM | POA: Diagnosis not present

## 2015-12-28 DIAGNOSIS — I10 Essential (primary) hypertension: Secondary | ICD-10-CM | POA: Diagnosis not present

## 2015-12-28 DIAGNOSIS — R072 Precordial pain: Secondary | ICD-10-CM | POA: Diagnosis not present

## 2015-12-28 HISTORY — DX: Atherosclerosis of aorta: I70.0

## 2015-12-28 HISTORY — DX: Nonrheumatic aortic (valve) stenosis: I35.0

## 2015-12-28 HISTORY — DX: Emphysema, unspecified: J43.9

## 2015-12-28 HISTORY — DX: Dorsalgia, unspecified: M54.9

## 2015-12-28 HISTORY — DX: Unspecified viral hepatitis C without hepatic coma: B19.20

## 2015-12-28 HISTORY — DX: Prediabetes: R73.03

## 2015-12-28 HISTORY — DX: Tobacco use: Z72.0

## 2015-12-28 LAB — HEPATIC FUNCTION PANEL
ALK PHOS: 74 U/L (ref 38–126)
ALT: 23 U/L (ref 17–63)
AST: 22 U/L (ref 15–41)
Albumin: 4 g/dL (ref 3.5–5.0)
Bilirubin, Direct: 0.1 mg/dL (ref 0.1–0.5)
Indirect Bilirubin: 0.6 mg/dL (ref 0.3–0.9)
TOTAL PROTEIN: 7.4 g/dL (ref 6.5–8.1)
Total Bilirubin: 0.7 mg/dL (ref 0.3–1.2)

## 2015-12-28 LAB — BASIC METABOLIC PANEL
ANION GAP: 9 (ref 5–15)
BUN: 8 mg/dL (ref 6–20)
CO2: 23 mmol/L (ref 22–32)
Calcium: 9.3 mg/dL (ref 8.9–10.3)
Chloride: 106 mmol/L (ref 101–111)
Creatinine, Ser: 1.1 mg/dL (ref 0.61–1.24)
Glucose, Bld: 160 mg/dL — ABNORMAL HIGH (ref 65–99)
POTASSIUM: 3.6 mmol/L (ref 3.5–5.1)
Sodium: 138 mmol/L (ref 135–145)

## 2015-12-28 LAB — TROPONIN I: Troponin I: <0.03 ng/mL (ref <0.03)

## 2015-12-28 LAB — CBC
HEMATOCRIT: 43.9 % (ref 39.0–52.0)
HEMOGLOBIN: 14.7 g/dL (ref 13.0–17.0)
MCH: 28.7 pg (ref 26.0–34.0)
MCHC: 33.5 g/dL (ref 30.0–36.0)
MCV: 85.7 fL (ref 78.0–100.0)
Platelets: 176 10*3/uL (ref 150–400)
RBC: 5.12 MIL/uL (ref 4.22–5.81)
RDW: 14.5 % (ref 11.5–15.5)
WBC: 8.6 10*3/uL (ref 4.0–10.5)

## 2015-12-28 LAB — I-STAT TROPONIN, ED: Troponin i, poc: 0.01 ng/mL (ref 0.00–0.08)

## 2015-12-28 LAB — D-DIMER, QUANTITATIVE (NOT AT ARMC): D DIMER QUANT: 0.58 ug{FEU}/mL — AB (ref 0.00–0.50)

## 2015-12-28 MED ORDER — METAXALONE 400 MG HALF TABLET
400.0000 mg | ORAL_TABLET | Freq: Three times a day (TID) | ORAL | Status: DC
  Administered 2015-12-28 – 2015-12-30 (×5): 400 mg via ORAL
  Filled 2015-12-28 (×5): qty 0.5

## 2015-12-28 MED ORDER — ENOXAPARIN SODIUM 40 MG/0.4ML ~~LOC~~ SOLN
40.0000 mg | SUBCUTANEOUS | Status: DC
  Administered 2015-12-28 – 2015-12-29 (×2): 40 mg via SUBCUTANEOUS
  Filled 2015-12-28 (×2): qty 0.4

## 2015-12-28 MED ORDER — DOCUSATE SODIUM 100 MG PO CAPS: 100.0000 mg | ORAL_CAPSULE | Freq: Two times a day (BID) | ORAL | Status: DC | PRN

## 2015-12-28 MED ORDER — ALPRAZOLAM 0.5 MG PO TABS: 0.2500 mg | ORAL_TABLET | Freq: Two times a day (BID) | ORAL | Status: DC | PRN

## 2015-12-28 MED ORDER — ALBUTEROL SULFATE (2.5 MG/3ML) 0.083% IN NEBU: 2.5000 mg | INHALATION_SOLUTION | Freq: Four times a day (QID) | RESPIRATORY_TRACT | Status: DC | PRN

## 2015-12-28 MED ORDER — AMLODIPINE BESYLATE 5 MG PO TABS
10.0000 mg | ORAL_TABLET | Freq: Every day | ORAL | Status: DC
  Administered 2015-12-29 – 2015-12-30 (×2): 10 mg via ORAL
  Filled 2015-12-28 (×2): qty 2

## 2015-12-28 MED ORDER — ONDANSETRON HCL 4 MG/2ML IJ SOLN: 4.0000 mg | Freq: Four times a day (QID) | INTRAMUSCULAR | Status: DC | PRN

## 2015-12-28 MED ORDER — DM-GUAIFENESIN ER 30-600 MG PO TB12: 1.0000 | ORAL_TABLET | Freq: Two times a day (BID) | ORAL | Status: DC | PRN

## 2015-12-28 MED ORDER — OXYCODONE HCL 5 MG PO TABS
10.0000 mg | ORAL_TABLET | Freq: Four times a day (QID) | ORAL | Status: DC | PRN
  Administered 2015-12-28 – 2015-12-30 (×4): 10 mg via ORAL
  Filled 2015-12-28 (×4): qty 2

## 2015-12-28 MED ORDER — IOPAMIDOL (ISOVUE-370) INJECTION 76%
INTRAVENOUS | Status: AC
  Administered 2015-12-28: 70 mL
  Filled 2015-12-28: qty 100

## 2015-12-28 MED ORDER — DICLOFENAC SODIUM 50 MG PO TBEC
50.0000 mg | DELAYED_RELEASE_TABLET | Freq: Two times a day (BID) | ORAL | Status: DC
  Administered 2015-12-28 – 2015-12-30 (×4): 50 mg via ORAL
  Filled 2015-12-28 (×4): qty 1

## 2015-12-28 MED ORDER — MORPHINE SULFATE (PF) 4 MG/ML IV SOLN: 2.0000 mg | INTRAVENOUS | Status: DC | PRN

## 2015-12-28 MED ORDER — NITROGLYCERIN 0.4 MG SL SUBL: 0.4000 mg | SUBLINGUAL_TABLET | SUBLINGUAL | Status: DC | PRN

## 2015-12-28 MED ORDER — INFLUENZA VAC SPLIT QUAD 0.5 ML IM SUSY
0.5000 mL | PREFILLED_SYRINGE | INTRAMUSCULAR | Status: AC
  Administered 2015-12-29: 0.5 mL via INTRAMUSCULAR
  Filled 2015-12-28: qty 0.5

## 2015-12-28 MED ORDER — SODIUM CHLORIDE 0.9 % IV SOLN
INTRAVENOUS | Status: DC
  Administered 2015-12-28 – 2015-12-30 (×3): via INTRAVENOUS

## 2015-12-28 MED ORDER — ASPIRIN EC 325 MG PO TBEC
325.0000 mg | DELAYED_RELEASE_TABLET | Freq: Every day | ORAL | Status: DC
  Administered 2015-12-28 – 2015-12-30 (×3): 325 mg via ORAL
  Filled 2015-12-28 (×3): qty 1

## 2015-12-28 MED ORDER — ZOLPIDEM TARTRATE 5 MG PO TABS
5.0000 mg | ORAL_TABLET | Freq: Every evening | ORAL | Status: DC | PRN
Start: 2015-12-28 — End: 2015-12-30

## 2015-12-28 MED ORDER — LISINOPRIL 2.5 MG PO TABS
2.5000 mg | ORAL_TABLET | Freq: Every day | ORAL | Status: DC
  Administered 2015-12-29: 2.5 mg via ORAL
  Filled 2015-12-28: qty 1

## 2015-12-28 MED ORDER — HYDROCHLOROTHIAZIDE 25 MG PO TABS
25.0000 mg | ORAL_TABLET | Freq: Every day | ORAL | Status: DC
  Administered 2015-12-29 – 2015-12-30 (×2): 25 mg via ORAL
  Filled 2015-12-28 (×2): qty 1

## 2015-12-28 NOTE — H&P (Signed)
thank you History and Physical    [name] [facility] [id number] DOB: [birth year] DOA: 12/28/2015  Referring MD/NP/PA:   PCP: [facility]   Patient coming from:  The patient is coming from home.  At baseline, pt is independent for most of ADL.   Chief Complaint: Chest pain  HPI: Zachary Briggs is a 60 y.o. male with medical history significant of hypertension, prostate cancer (s/p of radiation therapy, did not require surgery), hepatitis C, tobacco abuse, marijuana abuse, chronic back pain, who presents with chest pain.  Patient states that he has intermittent chest pain for almost 3 months. It happens infrequently, most of time at a low level, but can be severe pain. He has worsening chest pain today, which is located in the substernal area, also on the right side of chest, sharp, 8 out of 10 severity, radiating to the right shoulder area. It is aggravated by laying down to the right side. It is not pleuritic or exertional. It is associated with shortness breath and dry cough. Sometimes he has lightheadedness. Patient does not have tenderness over calf areas. Denies fever or chills. Patient does not have nausea, vomiting, diarrhea, abdominal pain. No unilateral weakness. Patient states that sometimes he has difficult urination and dysuria.  ED Course: pt was found to have negative troponin, WBC 8.6, temperature normal, bradycardia, oxygen saturation 98% on room air, S renal function okay. D-dimer positive at 0.58. Pending UA. Chest x-ray showed chronic bronchitis change. Patient is placed on telemetry bed observation.  Review of Systems:   General: no fevers, chills, no changes in body weight, has fatigue HEENT: no blurry vision, hearing changes or sore throat Respiratory: has dyspnea, coughing, no  wheezing CV: has chest pain, no palpitations GI: no nausea, vomiting, abdominal pain, diarrhea, constipation GU: has dysuria, no burning on urination, increased urinary frequency,  hematuria  Ext: no leg edema Neuro: no unilateral weakness, numbness, or tingling, no vision change or hearing loss Skin: no rash, no skin tear. MSK: No muscle spasm, no deformity, no limitation of range of movement in spin Heme: No easy bruising.  Travel history: No recent long distant travel.  Allergy: No Known Allergies  Past Medical History:  Diagnosis Date  . Back pain   . Heart murmur   . Heart valve disease   . Hepatitis C   . Hypertension   . Prostate cancer ([place])   . Tobacco abuse     Past Surgical History:  Procedure Laterality Date  . BACK SURGERY      Social History:  reports that he has been smoking Cigarettes.  He has never used smokeless tobacco. He reports that he drinks about 2.4 oz of alcohol per week . He reports that he uses drugs, including Marijuana.  Family History: Patient's mother, father and sister had cancer, but pt is no shoulder which type of cancer.  Family History  Problem Relation Age of Onset  . Cancer Mother   . Cancer Father   . Cancer Sister   . Diabetes Mellitus II Brother      Prior to Admission medications   Medication Sig Start Date End Date Taking? Authorizing Provider  amLODipine (NORVASC) 10 MG tablet Take 10 mg by mouth daily.   Yes Historical Provider, MD  aspirin EC 81 MG tablet Take 81 mg by mouth daily.   Yes Historical Provider, MD  diclofenac (VOLTAREN) 50 MG EC tablet Take 50 mg by mouth 2 (two) times daily.   Yes Historical  Provider, MD  docusate sodium (COLACE) 100 MG capsule Take 100 mg by mouth 2 (two) times daily as needed for constipation.   Yes Historical Provider, MD  hydrochlorothiazide (HYDRODIURIL) 25 MG tablet Take 25 mg by mouth daily.   Yes Historical Provider, MD  lisinopril (PRINIVIL,ZESTRIL) 5 MG tablet Take 2.5 mg by mouth daily.   Yes Historical Provider, MD  metaxalone (SKELAXIN) 800 MG tablet Take 0.5 tablets (400 mg total) by mouth 3 (three) times daily. 10/02/14  Yes Junius Creamer, NP  morphine  (MSIR) 15 MG tablet Take 15 mg by mouth every 4 (four) hours as needed for pain.   Yes Historical Provider, MD  ondansetron (ZOFRAN) 4 MG tablet Take 1 tablet (4 mg total) by mouth every 6 (six) hours. 08/26/12  Yes Tiffany Carlota Raspberry, PA-C  traMADol (ULTRAM) 50 MG tablet Take 1 tablet (50 mg total) by mouth every 6 (six) hours as needed. Patient taking differently: Take 50 mg by mouth every 6 (six) hours as needed for moderate pain.  10/06/14  Yes Montine Circle, PA-C  ondansetron (ZOFRAN ODT) 8 MG disintegrating tablet Take 1 tablet (8 mg total) by mouth every 8 (eight) hours as needed for nausea. Patient not taking: Reported on 12/28/2015 08/31/12   Varney Biles, MD  oxyCODONE 10 MG TABS Take 0.5 tablets (5 mg total) by mouth every 4 (four) hours as needed for severe pain. Patient not taking: Reported on 12/28/2015 10/02/14   Junius Creamer, NP  oxyCODONE-acetaminophen (PERCOCET/ROXICET) 5-325 MG per tablet Take 1 tablet by mouth every 6 (six) hours as needed for pain. Patient not taking: Reported on 12/28/2015 08/26/12   Delos Haring, PA-C  oxyCODONE-acetaminophen (PERCOCET/ROXICET) 5-325 MG per tablet Take 1 tablet by mouth every 6 (six) hours as needed for pain. Patient not taking: Reported on 12/28/2015 08/31/12   Varney Biles, MD    Physical Exam: Vitals:   12/28/15 2000 12/28/15 2045 12/28/15 2123 12/28/15 2126  BP: 136/77 159/82  (!) 165/74  Pulse: (!) 53 (!) 48  (!) 49  Resp: 15 17  17   Temp:    97.8 F (36.6 C)  TempSrc:    Oral  SpO2: 96% 95%  99%  Weight:   74 kg (163 lb 1.6 oz)   Height:   5\' 5"  (1.651 m)    General: Not in acute distress HEENT:       Eyes: PERRL, EOMI, no scleral icterus.       ENT: No discharge from the ears and nose, no pharynx injection, no tonsillar enlargement.        Neck: No JVD, no bruit, no mass felt. Heme: No neck lymph node enlargement. Cardiac: [id number], RRR, 2/6 systolic murmurs, No gallops or rubs. Respiratory: No rales, wheezing, rhonchi or  rubs. GI: Soft, nondistended, nontender, no rebound pain, no organomegaly, BS present. GU: No hematuria Ext: No pitting leg edema bilaterally. 2+DP/PT pulse bilaterally. Musculoskeletal: No joint deformities, No joint redness or warmth, no limitation of ROM in spin. Skin: No rashes.  Neuro: Alert, oriented X3, cranial nerves II-XII grossly intact, moves all extremities normally.  Psych: Patient is not psychotic, no suicidal or hemocidal ideation.  Labs on Admission: I have personally reviewed following labs and imaging studies  CBC:  Recent Labs Lab 12/28/15 1604  WBC 8.6  HGB 14.7  HCT 43.9  MCV 85.7  PLT 0000000   Basic Metabolic Panel:  Recent Labs Lab 12/28/15 1604  NA 138  K 3.6  CL 106  CO2 23  GLUCOSE 160*  BUN 8  CREATININE 1.10  CALCIUM 9.3   GFR: Estimated Creatinine Clearance: 67.2 mL/min (by C-G formula based on SCr of 1.1 mg/dL). Liver Function Tests:  Recent Labs Lab 12/28/15 2035  AST 22  ALT 23  ALKPHOS 74  BILITOT 0.7  PROT 7.4  ALBUMIN 4.0   No results for input(s): LIPASE, AMYLASE in the last 168 hours. No results for input(s): AMMONIA in the last 168 hours. Coagulation Profile: No results for input(s): INR, PROTIME in the last 168 hours. Cardiac Enzymes:  Recent Labs Lab 12/28/15 1604 12/28/15 2035  TROPONINI <0.03 <0.03   BNP (last 3 results) No results for input(s): PROBNP in the last 8760 hours. HbA1C: No results for input(s): HGBA1C in the last 72 hours. CBG: No results for input(s): GLUCAP in the last 168 hours. Lipid Profile: No results for input(s): CHOL, HDL, LDLCALC, TRIG, CHOLHDL, LDLDIRECT in the last 72 hours. Thyroid Function Tests: No results for input(s): TSH, T4TOTAL, FREET4, T3FREE, THYROIDAB in the last 72 hours. Anemia Panel: No results for input(s): VITAMINB12, FOLATE, FERRITIN, TIBC, IRON, RETICCTPCT in the last 72 hours. Urine analysis:    Component Value Date/Time   COLORURINE YELLOW 08/26/2012 [place] 08/26/2012 1452   LABSPEC 1.016 08/26/2012 1452   PHURINE 6.0 08/26/2012 1452   GLUCOSEU NEGATIVE 08/26/2012 1452   HGBUR NEGATIVE 08/26/2012 1452   BILIRUBINUR NEGATIVE 08/26/2012 1452   KETONESUR NEGATIVE 08/26/2012 1452   PROTEINUR NEGATIVE 08/26/2012 1452   UROBILINOGEN 0.2 08/26/2012 1452   NITRITE NEGATIVE 08/26/2012 1452   LEUKOCYTESUR SMALL (A) 08/26/2012 1452   Sepsis Labs: @LABRCNTIP (procalcitonin:4,lacticidven:4) )No results found for this or any previous visit (from the past 240 hour(s)).   Radiological Exams on Admission: Dg Chest 2 View  Result Date: 12/28/2015 CLINICAL DATA:  Short of breath EXAM: CHEST  2 VIEW COMPARISON:  08/17/2006 FINDINGS: Normal cardiac silhouette. There is chronic bronchitic markings centrally similar to prior. No effusion infiltrate pneumothorax. IMPRESSION: Chronic bronchitic markings centrally.  No acute findings. Electronically Signed   By: Suzy Bouchard M.D.   On: 12/28/2015 16:42     EKG: Independently reviewed. Sinus rhythm, QTC 386, bradycardia, T-wave inversion in II/aVF and V4-V6, anteroseptal infarction pattern.   Assessment/Plan Principal Problem:   Chest pain Active Problems:   Hypertension   Tobacco abuse   Back pain   Chest pain: Patient has atypical chest pain, etiology is not clear. Differential diagnosis includes coronary artery disease and PE given the elevated d-dimer and SOB. No infiltration on chest x-ray.  - will place on Tele bed for obs - cycle CE q6 x3 and repeat her EKG in the am  - Nitroglycerin, Morphine, and aspirin - Risk factor stratification: will check FLP, UDS and A1C  - 2d echo - will get CTA to r/o PE  HTN: -continue amlodipine, HCTZ, lisinopril  Tobacco abuse: -Did counseling about importance of quitting smoking -Nicotine patch  Back pain: -prn oxycodone   DVT ppx: SQ Lovenox Code Status: Full code Family Communication:  Yes, patient's wife at bed  side Disposition Plan:  Anticipate discharge back to previous home environment Consults called: none  Admission status: Obs / tele     Date of Service 12/28/2015    Pritesh Sobecki, [facility] Pager [phone]  If 7PM-7AM, please contact night-coverage [email or web address] Password [email or web address] 12/28/2015, 10:15 PM

## 2015-12-28 NOTE — ED Provider Notes (Signed)
[facility] DEPT Provider Note   CSN: [id number] Arrival date & time: 12/28/15  1553     History   Chief Complaint Chief Complaint  Patient presents with  . Chest Pain    HPI Zachary Briggs is a 60 year old man with history of HTN, hepatitis C, prostate cancer.  HPI  He presents with chest pain. He has been having chest pain for the past couple months. It is constant. The pain is sharp and midsternal and radiates to the right side of his chest. It also radiates to his back. Worse when lying on his right side. No relieving factors. Tramadol has not helped. Currently 10/10. No dyspnea. Some lightheadedness. No diaphoresis. No orthopnea. No PND. No recent trauma. No recent illnesses. No sick contacts. No recent travel.   Legs hurt with walking. He does very limited walking.   He was sent here from PCP office after they were concerned about his EKG.  He reports he has had a stress test in the past "a while ago" and was told he has had small heart attacks in the past. Never had a heart cath. No PCI.  No family history of MI  Past Medical History:  Diagnosis Date  . Heart murmur   . Heart valve disease   . Hepatitis C   . Hypertension   . Prostate cancer [facility])     Past Surgical History:  Procedure Laterality Date  . BACK SURGERY      Home Medications    Prior to Admission medications   Medication Sig Start Date End Date Taking? Authorizing Provider  amLODipine (NORVASC) 10 MG tablet Take 10 mg by mouth daily.   Yes Historical Provider, MD  aspirin EC 81 MG tablet Take 81 mg by mouth daily.   Yes Historical Provider, MD  diclofenac (VOLTAREN) 50 MG EC tablet Take 50 mg by mouth 2 (two) times daily.   Yes Historical Provider, MD  docusate sodium (COLACE) 100 MG capsule Take 100 mg by mouth 2 (two) times daily as needed for constipation.   Yes Historical Provider, MD  hydrochlorothiazide (HYDRODIURIL) 25 MG tablet Take 25 mg by mouth daily.   Yes Historical Provider,  MD  lisinopril (PRINIVIL,ZESTRIL) 5 MG tablet Take 2.5 mg by mouth daily.   Yes Historical Provider, MD  metaxalone (SKELAXIN) 800 MG tablet Take 0.5 tablets (400 mg total) by mouth 3 (three) times daily. 10/02/14  Yes Junius Creamer, NP  morphine (MSIR) 15 MG tablet Take 15 mg by mouth every 4 (four) hours as needed for pain.   Yes Historical Provider, MD  ondansetron (ZOFRAN) 4 MG tablet Take 1 tablet (4 mg total) by mouth every 6 (six) hours. 08/26/12  Yes Tiffany Carlota Raspberry, PA-C  traMADol (ULTRAM) 50 MG tablet Take 1 tablet (50 mg total) by mouth every 6 (six) hours as needed. Patient taking differently: Take 50 mg by mouth every 6 (six) hours as needed for moderate pain.  10/06/14  Yes Montine Circle, PA-C  ondansetron (ZOFRAN ODT) 8 MG disintegrating tablet Take 1 tablet (8 mg total) by mouth every 8 (eight) hours as needed for nausea. Patient not taking: Reported on 12/28/2015 08/31/12   Varney Biles, MD  oxyCODONE 10 MG TABS Take 0.5 tablets (5 mg total) by mouth every 4 (four) hours as needed for severe pain. Patient not taking: Reported on 12/28/2015 10/02/14   Junius Creamer, NP  oxyCODONE-acetaminophen (PERCOCET/ROXICET) 5-325 MG per tablet Take 1 tablet by mouth every 6 (six) hours as needed for  pain. Patient not taking: Reported on 12/28/2015 08/26/12   Delos Haring, PA-C  oxyCODONE-acetaminophen (PERCOCET/ROXICET) 5-325 MG per tablet Take 1 tablet by mouth every 6 (six) hours as needed for pain. Patient not taking: Reported on 12/28/2015 08/31/12   Varney Biles, MD    Family History History reviewed. No pertinent family history.  Social History Social History  Substance Use Topics  . Smoking status: Current Every Day Smoker    Types: Cigarettes  . Smokeless tobacco: Never Used  . Alcohol use 2.4 oz/week    4 Cans of beer per week     Allergies   Review of patient's allergies indicates no known allergies.   Review of Systems Review of Systems Constitutional: no  fevers/chills Eyes: no vision changes Ears, nose, mouth, throat, and face: +cough, nonproductive Respiratory: no shortness of breath Cardiovascular: +chest pain Gastrointestinal: no nausea/vomiting, no abdominal pain, no constipation, no diarrhea Genitourinary: no dysuria, no hematuria Integument: no rash Hematologic/lymphatic: no bleeding/bruising, no edema Musculoskeletal: +chronic arthralgias, no myalgias Neurological: no paresthesias, no weakness   Physical Exam Updated Vital Signs BP 141/77 (BP Location: Left Arm)   Pulse (!) 56   Temp 98.2 F (36.8 C) (Oral)   Resp 20   SpO2 98%   Physical Exam General Apperance: NAD Head: Normocephalic, atraumatic Eyes: PERRL, EOMI, anicteric sclera Ears: Normal external ear canal Nose: Nares normal, septum midline, mucosa normal Throat: Lips, mucosa and tongue normal  Neck: Supple, trachea midline Back: No tenderness or bony abnormality  Lungs: Clear to auscultation bilaterally. No wheezes, rhonchi or rales. Breathing comfortably Chest Wall: Tender to palpation along right chest, no deformity Heart: Regular rate and rhythm, +systolic ejection murmur heard best in RUSB, no rub/gallop Abdomen: Soft, nontender, nondistended, no rebound/guarding Extremities: Normal, atraumatic, warm and well perfused, no edema Pulses: 2+ throughout Skin: No rashes or lesions Neurologic: Alert and oriented x 3. CNII-XII intact. Normal strength and sensation   ED Treatments / Results  Labs (all labs ordered are listed, but only abnormal results are displayed) Labs Reviewed  BASIC METABOLIC PANEL - Abnormal; Notable for the following:       Result Value   Glucose, Bld 160 (*)    All other components within normal limits  CBC  TROPONIN I  I-STAT TROPOININ, ED    EKG  EKG Interpretation  Date/Time:  Friday December 28 2015 19:21:44 EDT Ventricular Rate:  53 PR Interval:  150 QRS Duration: 95 QT Interval:  429 QTC Calculation: 403 R  Axis:   -11 Text Interpretation:  Sinus rhythm Probable anteroseptal infarct, recent ST/T changes inferiorly and V4-6 unchanged from earlier in the day Confirmed by GOLDSTON MD, [place] [phone]) on 12/28/2015 7:48:54 PM       Radiology Dg Chest 2 View  Result Date: 12/28/2015 CLINICAL DATA:  Short of breath EXAM: CHEST  2 VIEW COMPARISON:  08/17/2006 FINDINGS: Normal cardiac silhouette. There is chronic bronchitic markings centrally similar to prior. No effusion infiltrate pneumothorax. IMPRESSION: Chronic bronchitic markings centrally.  No acute findings. Electronically Signed   By: Suzy Bouchard M.D.   On: 12/28/2015 16:42    Procedures Procedures (including critical care time)  Medications Ordered in ED Medications - No data to display   Initial Impression / Assessment and Plan / ED Course  I have reviewed the triage vital signs and the nursing notes.  Pertinent labs & imaging results that were available during my care of the patient were reviewed by me and considered in my medical decision making (  see chart for details).  Clinical Course  7:00pm evaluated pt 7:30pm discussed with cardiology (Dr. Wynonia Lawman)  Final Clinical Impressions(s) / ED Diagnoses   Final diagnoses:  Chest pain, unspecified type  Essential hypertension   He has constant chest pain for the past couple of months. Reproducible on palpation. EKG with new T wave inversions in the inferior leads. Mild ST elevation in anterior leads. Initial troponin negative. BMP and CBC unremarkable. CXR with no acute changes. Discussed with Dr. Wynonia Lawman, cardiology. Given his EKG changes, would have him admitted for serial troponins and further risk stratification.   Discussed with Dr. Blaine Hamper who has accepted the patient. Admit to telemetry for observation.  New Prescriptions New Prescriptions   No medications on file     Milagros Loll, MD 12/28/15 2010    Milagros Loll, MD 12/28/15 2010    Sherwood Gambler,  MD 12/28/15 [phone]

## 2015-12-28 NOTE — ED Notes (Signed)
Floor RN requested that pt be given pain medication prior to transport.  No medications verified at this time, RN called pharmacy to verify pain medication and ASA.  Medications given and pt will be transported.

## 2015-12-28 NOTE — ED Notes (Signed)
Resident at the bedside

## 2015-12-28 NOTE — ED Notes (Signed)
Attempted to call report

## 2015-12-28 NOTE — ED Triage Notes (Signed)
Pt presents with c/o CP. He reports several month history of chest pain. He reports tenderness on R side of chest and describes a "blocked feeling' In his midsternal area. The pain is mostly always present and worse when lying on his right side. He reports fatigue, sob, nonproductive cough, lightheadedness. He denies syncope. He went to establish care with a new PCP today and they were concerned about his EKG there so sent him to ED for further evaluation. He is alert and breathing easily

## 2015-12-29 ENCOUNTER — Observation Stay (HOSPITAL_COMMUNITY): Payer: Medicaid Other

## 2015-12-29 ENCOUNTER — Observation Stay (HOSPITAL_BASED_OUTPATIENT_CLINIC_OR_DEPARTMENT_OTHER): Payer: Medicaid Other

## 2015-12-29 ENCOUNTER — Encounter (HOSPITAL_COMMUNITY): Payer: Self-pay | Admitting: Internal Medicine

## 2015-12-29 DIAGNOSIS — R079 Chest pain, unspecified: Secondary | ICD-10-CM

## 2015-12-29 DIAGNOSIS — J439 Emphysema, unspecified: Secondary | ICD-10-CM

## 2015-12-29 DIAGNOSIS — M545 Low back pain: Secondary | ICD-10-CM

## 2015-12-29 DIAGNOSIS — Z720 Tobacco use: Secondary | ICD-10-CM

## 2015-12-29 DIAGNOSIS — I700 Atherosclerosis of aorta: Secondary | ICD-10-CM | POA: Diagnosis not present

## 2015-12-29 DIAGNOSIS — R7303 Prediabetes: Secondary | ICD-10-CM

## 2015-12-29 DIAGNOSIS — I10 Essential (primary) hypertension: Secondary | ICD-10-CM

## 2015-12-29 DIAGNOSIS — G8929 Other chronic pain: Secondary | ICD-10-CM

## 2015-12-29 DIAGNOSIS — I350 Nonrheumatic aortic (valve) stenosis: Secondary | ICD-10-CM | POA: Diagnosis not present

## 2015-12-29 HISTORY — DX: Atherosclerosis of aorta: I70.0

## 2015-12-29 HISTORY — DX: Emphysema, unspecified: J43.9

## 2015-12-29 HISTORY — DX: Prediabetes: R73.03

## 2015-12-29 LAB — RAPID URINE DRUG SCREEN, HOSP PERFORMED
Amphetamines: NOT DETECTED
BARBITURATES: NOT DETECTED
Benzodiazepines: NOT DETECTED
Cocaine: POSITIVE — AB
Opiates: NOT DETECTED
Tetrahydrocannabinol: POSITIVE — AB

## 2015-12-29 LAB — URINALYSIS, ROUTINE W REFLEX MICROSCOPIC
BILIRUBIN URINE: NEGATIVE
Glucose, UA: NEGATIVE mg/dL
HGB URINE DIPSTICK: NEGATIVE
Ketones, ur: NEGATIVE mg/dL
Leukocytes, UA: NEGATIVE
Nitrite: NEGATIVE
PROTEIN: NEGATIVE mg/dL
Specific Gravity, Urine: 1.023 (ref 1.005–1.030)
pH: 6 (ref 5.0–8.0)

## 2015-12-29 LAB — TROPONIN I
Troponin I: <0.03 ng/mL (ref <0.03)
Troponin I: <0.03 ng/mL (ref <0.03)

## 2015-12-29 LAB — LIPID PANEL
CHOLESTEROL: 163 mg/dL (ref 0–200)
HDL: 50 mg/dL (ref >40)
LDL Cholesterol: 98 mg/dL (ref 0–99)
TRIGLYCERIDES: 76 mg/dL (ref <150)
Total CHOL/HDL Ratio: 3.3 RATIO
VLDL: 15 mg/dL (ref 0–40)

## 2015-12-29 LAB — ECHOCARDIOGRAM COMPLETE
HEIGHTINCHES: 65 in
WEIGHTICAEL: 2625.6 [oz_av]

## 2015-12-29 LAB — HEMOGLOBIN A1C
HEMOGLOBIN A1C: 5.8 % — AB (ref 4.8–5.6)
Mean Plasma Glucose: 120 mg/dL

## 2015-12-29 MED ORDER — NICOTINE 7 MG/24HR TD PT24
7.0000 mg | MEDICATED_PATCH | Freq: Every day | TRANSDERMAL | Status: DC
  Administered 2015-12-29 – 2015-12-30 (×2): 7 mg via TRANSDERMAL
  Filled 2015-12-29 (×2): qty 1

## 2015-12-29 NOTE — Plan of Care (Signed)
Problem: Education: Goal: Knowledge of [facility] Education information/materials will improve Outcome: Progressing Pt oriented to floor and unit. Call bell placed within reach. Bed low and locked. Assessment and admission hx completed, see flowsheet. Pt provided packet on obtaining advance directives as requested. Flu vaccine has been ordered as pt has requested to be vaccinated during this hospital stay.    Problem: Safety: Goal: Ability to remain free from injury will improve Outcome: Progressing Pt is a moderate fall risk, uses a walker at home. Pt understand the fall risk prevention strategies and agrees to call for assistance before exiting bed.   Problem: Pain Managment: Goal: General experience of comfort will improve Outcome: Progressing Pain is now being controlled with PRN pain medication. CP is now rated a 5/10. Pt is resting well in bed, no signs of grimacing.

## 2015-12-29 NOTE — Progress Notes (Signed)
  Echocardiogram 2D Echocardiogram has been performed.  Zachary Briggs 12/29/2015, 1:35 PM

## 2015-12-29 NOTE — Progress Notes (Signed)
Progress Note    Zachary Briggs [facility]  [id number] DOB: Dec 21, 1955  DOA: 12/28/2015 PCP: [place]    Brief Narrative:   Chief complaint: F/U chest pain  Zachary Briggs is an 60 y.o. male with a PMH of hypertension, prostate cancer (s/p of radiation therapy, did not require surgery), hepatitis C, tobacco abuse, marijuana abuse, chronic back pain who was admitted 12/28/15 for evaluation of a 3 month history of chest pain and shortness of breath. In the ED, his initial troponin was negative, WBC 8.6, afebrile with mild bradycardia. Chest x-ray showed chronic bronchitic changes.  Assessment/Plan:   Principal Problem:   Atypical Chest pain/aortic atherosclerosis/pulmonary bulla CT of the chest showed cardiomegaly and aortic atherosclerosis with aortic valvular calcifications. There was a 2 cm right lower lobe bulla with adjacent atelectasis. No evidence of pulmonary embolism. LDL 98, total cholesterol 163. Troponins negative 3. Continue aspirin. Remains mildly bradycardic.  Active Problems:   Hyperglycemia/prediabetes Glucose 160 on chemistries. Hemoglobin A1c 5.8%, findings consistent with prediabetes. Dietician consult for diet counseling.    Hypertension Continue Norvasc, lisinopril and hydrochlorothiazide.    Tobacco abuse Counseled. Will order a nicotine patch.    Back pain Continue home regimen.   Family Communication/Anticipated D/C date and plan/Code Status   DVT prophylaxis: Lovenox ordered. Code Status: Full Code.  Family Communication: No family at the bedside. Disposition Plan: Home 12/30/15 if 2 D echo OK.   Medical Consultants:    None.   Procedures:    None  Anti-Infectives:    None  Subjective:   The patient reports that his chest pain has lessened. Review of symptoms is negative for shortness of breath, nausea/vomiting.  Objective:    Vitals:   12/28/15 2045 12/28/15 2123 12/28/15 2126 12/29/15 0500  BP: 159/82  (!)  165/74 (!) 156/80  Pulse: (!) 48  (!) 49 (!) 51  Resp: 17  17 16   Temp:   97.8 F (36.6 C) 97.7 F (36.5 C)  TempSrc:   Oral Oral  SpO2: 95%  99% 97%  Weight:  74 kg (163 lb 1.6 oz)  74.4 kg (164 lb 1.6 oz)  Height:  5\' 5"  (1.651 m)      Intake/Output Summary (Last 24 hours) at 12/29/15 0736 Last data filed at 12/29/15 0300  Gross per 24 hour  Intake              290 ml  Output                0 ml  Net              290 ml   Filed Weights   12/28/15 2123 12/29/15 0500  Weight: 74 kg (163 lb 1.6 oz) 74.4 kg (164 lb 1.6 oz)    Exam: General exam: Appears calm and comfortable.  Respiratory system: Clear to auscultation. Respiratory effort normal. Cardiovascular system: S1 & S2 heard, RRR. No JVD,  rubs, gallops or clicks. 2/6 systolic murmur. Gastrointestinal system: Abdomen is nondistended, soft and nontender. No organomegaly or masses felt. Normal bowel sounds heard. Central nervous system: Alert and oriented. No focal neurological deficits. Extremities: No clubbing,  or cyanosis. No edema. Skin: No rashes, lesions or ulcers. Psychiatry: Judgement and insight appear normal. Mood & affect appropriate.   Data Reviewed:   I have personally reviewed following labs and imaging studies:  Labs: Basic Metabolic Panel:  Recent Labs Lab 12/28/15 1604  NA 138  K 3.6  CL 106  CO2 23  GLUCOSE 160*  BUN 8  CREATININE 1.10  CALCIUM 9.3   GFR Estimated Creatinine Clearance: 67.4 mL/min (by C-G formula based on SCr of 1.1 mg/dL). Liver Function Tests:  Recent Labs Lab 12/28/15 2035  AST 22  ALT 23  ALKPHOS 74  BILITOT 0.7  PROT 7.4  ALBUMIN 4.0    CBC:  Recent Labs Lab 12/28/15 1604  WBC 8.6  HGB 14.7  HCT 43.9  MCV 85.7  PLT 176   Cardiac Enzymes:  Recent Labs Lab 12/28/15 1604 12/28/15 2035 12/29/15 0230  TROPONINI <0.03 <0.03 <0.03   D-Dimer:  Recent Labs  12/28/15 2035  DDIMER 0.58*   Hgb A1c:  Recent Labs  12/28/15 2035  HGBA1C  5.8*   Lipid Profile:  Recent Labs  12/29/15 0230  CHOL 163  HDL 50  LDLCALC 98  TRIG 76  CHOLHDL 3.3    Microbiology No results found for this or any previous visit (from the past 240 hour(s)).  Radiology: Dg Chest 2 View  Result Date: 12/28/2015 CLINICAL DATA:  Short of breath EXAM: CHEST  2 VIEW COMPARISON:  08/17/2006 FINDINGS: Normal cardiac silhouette. There is chronic bronchitic markings centrally similar to prior. No effusion infiltrate pneumothorax. IMPRESSION: Chronic bronchitic markings centrally.  No acute findings. Electronically Signed   By: Suzy Bouchard M.D.   On: 12/28/2015 16:42   Ct Angio Chest Pe W Or Wo Contrast  Result Date: 12/29/2015 CLINICAL DATA:  Mid chest pain for 1 month.  Tobacco use. EXAM: CT ANGIOGRAPHY CHEST WITH CONTRAST TECHNIQUE: Multidetector CT imaging of the chest was performed using the standard protocol during bolus administration of intravenous contrast. Multiplanar CT image reconstructions and MIPs were obtained to evaluate the vascular anatomy. CONTRAST:  70 mL Isovue 370 COMPARISON:  None. FINDINGS: Cardiovascular: The study is of quality for the evaluation of pulmonary embolism. There are no filling defects in the central, lobar, segmental or subsegmental pulmonary artery branches to suggest acute pulmonary embolism. Great vessels are normal in course and caliber. There is cardiomegaly. No significant pericardial fluid/thickening. Aortic valvular calcifications. Mild coronary arteriosclerosis. Mediastinum/Nodes: No discrete thyroid nodules. Unremarkable esophagus. Small prevascular and aortopulmonary window lymph nodes the largest approximately 1 cm short axis. Lungs/Pleura: No pneumothorax. No pleural effusion. Dependent bilateral atelectasis. Small 2 cm right lower bulla with adjacent atelectasis. Upper abdomen: Unremarkable. Musculoskeletal:  No aggressive appearing focal osseous lesions. Review of the MIP images confirms the above  findings. IMPRESSION: No pulmonary embolus. Cardiomegaly. Aortic atherosclerosis. Aortic valvular calcifications are 2 cm right lower lobe bulla with adjacent atelectasis. Electronically Signed   By: Ashley Royalty M.D.   On: 12/29/2015 00:44    Medications:   . amLODipine  10 mg Oral Daily  . aspirin EC  325 mg Oral Daily  . diclofenac  50 mg Oral BID  . enoxaparin (LOVENOX) injection  40 mg Subcutaneous Q24H  . hydrochlorothiazide  25 mg Oral Daily  . Influenza vac split quadrivalent PF  0.5 mL Intramuscular Tomorrow-1000  . lisinopril  2.5 mg Oral Daily  . metaxalone  400 mg Oral TID   Continuous Infusions: . sodium chloride 75 mL/hr at 12/28/15 2308    Medical decision making is of high complexity and this patient is at high risk of deterioration, therefore this is a level 3 visit.     LOS: 0 days   RAMA,CHRISTINA  [facility] Pager [phone]. If unable to reach me by pager, please call my cell phone  at [phone].  *Please refer to [email or web address], password [email or web address] to get updated schedule on who will round on this patient, as hospitalists switch teams weekly. If 7PM-7AM, please contact night-coverage at [email or web address], password [email or web address] for any overnight needs.  12/29/2015, 7:36 AM

## 2015-12-29 NOTE — Progress Notes (Signed)
  RD consulted for nutrition education regarding pre-diabetes. Patient out of room for a procedure. Spoke with patient's wife and niece.  Lab Results  Component Value Date   HGBA1C 5.8 (H) 12/28/2015   RD provided "Carbohydrate Counting for People with Diabetes" and "Heart Healthy Consistent Carbohydrate Nutrition Therapy" handouts from the Academy of Nutrition and Dietetics. Provided list of carbohydrates and recommended serving sizes of common foods.  Body mass index is 27.31 kg/m. Pt meets criteria for normal weight based on current BMI.  Current diet order is Regular. Labs and medications reviewed. No further nutrition interventions warranted at this time. RD contact information provided. If additional nutrition issues arise, please re-consult RD.  Molli Barrows, RD, LDN, [place] Pager [phone] After Hours Pager [phone]

## 2015-12-30 ENCOUNTER — Encounter (HOSPITAL_COMMUNITY): Payer: Self-pay | Admitting: Internal Medicine

## 2015-12-30 DIAGNOSIS — I350 Nonrheumatic aortic (valve) stenosis: Secondary | ICD-10-CM

## 2015-12-30 DIAGNOSIS — I700 Atherosclerosis of aorta: Secondary | ICD-10-CM | POA: Diagnosis not present

## 2015-12-30 DIAGNOSIS — M545 Low back pain: Secondary | ICD-10-CM | POA: Diagnosis not present

## 2015-12-30 DIAGNOSIS — R079 Chest pain, unspecified: Secondary | ICD-10-CM | POA: Diagnosis not present

## 2015-12-30 HISTORY — DX: Nonrheumatic aortic (valve) stenosis: I35.0

## 2015-12-30 MED ORDER — LISINOPRIL 5 MG PO TABS: 2.5000 mg | ORAL_TABLET | Freq: Every day | ORAL | 3 refills | Status: DC

## 2015-12-30 MED ORDER — NICOTINE 7 MG/24HR TD PT24: 7.0000 mg | MEDICATED_PATCH | Freq: Every day | TRANSDERMAL | 1 refills | Status: DC

## 2015-12-30 MED ORDER — LISINOPRIL 5 MG PO TABS
5.0000 mg | ORAL_TABLET | Freq: Every day | ORAL | Status: DC
  Administered 2015-12-30: 5 mg via ORAL
  Filled 2015-12-30: qty 1

## 2015-12-30 NOTE — Discharge Summary (Signed)
Physician Discharge Summary  Zachary Briggs [facility] [name] DOB: [birth year] DOA: 12/28/2015  PCP: [place] date: 12/28/2015 Discharge date: 12/30/2015  Admitted From: Home Discharge disposition: Home   Recommendations for Outpatient Follow-Up:   1. Needs follow up of moderate aortic stenosis.   Discharge Diagnosis:   Principal Problem:    Chest pain Active Problems:    Hypertension    Tobacco abuse    Back pain    Aortic atherosclerosis (HCC)    Bulla of lung (HCC)    Prediabetes    Aortic stenosis   Discharge Condition: Improved.  Diet recommendation: Low sodium, heart healthy.  Carbohydrate-modified.    History of Present Illness:   Zachary Briggs is an 60 y.o. male with a PMH of hypertension, prostate cancer (s/p ofradiation therapy, did not require surgery), hepatitis C, tobacco abuse, marijuana abuse, chronic back pain who was admitted 12/28/15 for evaluation of a 3 month history of chest pain and shortness of breath. In the ED, his initial troponin was negative, WBC 8.6, afebrile with mild bradycardia. Chest x-ray showed chronic bronchitic changes.  Hospital Course by Problem:   Principal Problem:   Atypical Chest pain/aortic atherosclerosis/pulmonary bulla CT of the chest showed cardiomegaly and aortic atherosclerosis with aortic valvular calcifications. There was a 2 cm right lower lobe bulla with adjacent atelectasis. No evidence of pulmonary embolism. LDL 98, total cholesterol 163. Troponins negative 3. 2-D echo shows EF 65-70 percent with no regional wall motion abnormalities, and grade 1 diastolic dysfunction. Continue aspirin. Remains mildly bradycardic.  Active Problems:   Moderate aortic stenosis Noted on 2-D echo. AVA around 1.3-1.4 cm2, trivial MR, normal LA size, normal IVC.    Hyperglycemia/prediabetes Glucose 160 on chemistries. Hemoglobin A1c 5.8%, findings consistent with prediabetes. Counseled by  dietitian 12/29/15.    Hypertension Continue Norvasc, lisinopril and hydrochlorothiazide. Systolic blood pressures in the 150s. We will increase lisinopril to 5 mg daily.    Tobacco abuse Counseled. Will order a nicotine patch.    Back pain Continue home regimen.    Medical Consultants:    None.   Discharge Exam:   Vitals:   12/29/15 2108 12/30/15 0629  BP: (!) 141/71 (!) 156/80  Pulse: (!) 52 (!) 57  Resp: 18 14  Temp: 97.8 F (36.6 C) 97.8 F (36.6 C)   Vitals:   12/29/15 0500 12/29/15 0803 12/29/15 2108 12/30/15 0629  BP: (!) 156/80 (!) 152/95 (!) 141/71 (!) 156/80  Pulse: (!) 51  (!) 52 (!) 57  Resp: 16  18 14   Temp: 97.7 F (36.5 C) 97.9 F (36.6 C) 97.8 F (36.6 C) 97.8 F (36.6 C)  TempSrc: Oral Oral Oral Oral  SpO2: 97% 99% 96% 97%  Weight: 74.4 kg (164 lb 1.6 oz)   74.2 kg (163 lb 8 oz)  Height:        General exam: Appears calm and comfortable.  Respiratory system: Clear to auscultation. Respiratory effort normal. Cardiovascular system: S1 & S2 heard, RRR. No JVD,  rubs, gallops or clicks. 2/6 systolic murmur. Gastrointestinal system: Abdomen is nondistended, soft and nontender. No organomegaly or masses felt. Normal bowel sounds heard. Central nervous system: Alert and oriented. No focal neurological deficits. Extremities: No clubbing,  or cyanosis. No edema. Skin: No rashes, lesions or ulcers. Psychiatry: Judgement and insight appear normal. Mood & affect appropriate.    The results of significant diagnostics from this hospitalization (including imaging, microbiology, ancillary and laboratory) are listed below for reference.  Procedures and Diagnostic Studies:   Dg Chest 2 View  Result Date: 12/28/2015 CLINICAL DATA:  Short of breath EXAM: CHEST  2 VIEW COMPARISON:  08/17/2006 FINDINGS: Normal cardiac silhouette. There is chronic bronchitic markings centrally similar to prior. No effusion infiltrate pneumothorax. IMPRESSION: Chronic  bronchitic markings centrally.  No acute findings. Electronically Signed   By: Suzy Bouchard M.D.   On: 12/28/2015 16:42   Ct Angio Chest Pe W Or Wo Contrast  Result Date: 12/29/2015 CLINICAL DATA:  Mid chest pain for 1 month.  Tobacco use. EXAM: CT ANGIOGRAPHY CHEST WITH CONTRAST TECHNIQUE: Multidetector CT imaging of the chest was performed using the standard protocol during bolus administration of intravenous contrast. Multiplanar CT image reconstructions and MIPs were obtained to evaluate the vascular anatomy. CONTRAST:  70 mL Isovue 370 COMPARISON:  None. FINDINGS: Cardiovascular: The study is of quality for the evaluation of pulmonary embolism. There are no filling defects in the central, lobar, segmental or subsegmental pulmonary artery branches to suggest acute pulmonary embolism. Great vessels are normal in course and caliber. There is cardiomegaly. No significant pericardial fluid/thickening. Aortic valvular calcifications. Mild coronary arteriosclerosis. Mediastinum/Nodes: No discrete thyroid nodules. Unremarkable esophagus. Small prevascular and aortopulmonary window lymph nodes the largest approximately 1 cm short axis. Lungs/Pleura: No pneumothorax. No pleural effusion. Dependent bilateral atelectasis. Small 2 cm right lower bulla with adjacent atelectasis. Upper abdomen: Unremarkable. Musculoskeletal:  No aggressive appearing focal osseous lesions. Review of the MIP images confirms the above findings. IMPRESSION: No pulmonary embolus. Cardiomegaly. Aortic atherosclerosis. Aortic valvular calcifications are 2 cm right lower lobe bulla with adjacent atelectasis. Electronically Signed   By: Ashley Royalty M.D.   On: 12/29/2015 00:44   2 D echo   Labs:   Basic Metabolic Panel:  Recent Labs Lab 12/28/15 1604  NA 138  K 3.6  CL 106  CO2 23  GLUCOSE 160*  BUN 8  CREATININE 1.10  CALCIUM 9.3   GFR Estimated Creatinine Clearance: 67.3 mL/min (by C-G formula based on SCr of 1.1  mg/dL). Liver Function Tests:  Recent Labs Lab 12/28/15 2035  AST 22  ALT 23  ALKPHOS 74  BILITOT 0.7  PROT 7.4  ALBUMIN 4.0    CBC:  Recent Labs Lab 12/28/15 1604  WBC 8.6  HGB 14.7  HCT 43.9  MCV 85.7  PLT 176   Cardiac Enzymes:  Recent Labs Lab 12/28/15 1604 12/28/15 2035 12/29/15 0230 12/29/15 1000  TROPONINI <0.03 <0.03 <0.03 <0.03   D-Dimer  Recent Labs  12/28/15 2035  DDIMER 0.58*   Hgb A1c  Recent Labs  12/28/15 2035  HGBA1C 5.8*   Lipid Profile  Recent Labs  12/29/15 0230  CHOL 163  HDL 50  LDLCALC 98  TRIG 76  CHOLHDL 3.3     Discharge Instructions:   Discharge Instructions    Call MD for:  extreme fatigue    Complete by:  As directed    Call MD for:  persistant dizziness or light-headedness    Complete by:  As directed    Call MD for:  severe uncontrolled pain    Complete by:  As directed    Diet - low sodium heart healthy    Complete by:  As directed    Discharge instructions    Complete by:  As directed    Your chest pain likely is coming from your lungs.  It is very important you quit smoking to prevent further damage to your lungs.  You have a thickened  aortic valve and need to follow up regularly with your primary care doctor to keep a check on this.   Increase activity slowly    Complete by:  As directed        Medication List    STOP taking these medications   ondansetron 4 MG tablet Commonly known as:  ZOFRAN   ondansetron 8 MG disintegrating tablet Commonly known as:  ZOFRAN ODT   Oxycodone HCl 10 MG Tabs   oxyCODONE-acetaminophen 5-325 MG tablet Commonly known as:  PERCOCET/ROXICET     TAKE these medications   amLODipine 10 MG tablet Commonly known as:  NORVASC Take 10 mg by mouth daily.   aspirin EC 81 MG tablet Take 81 mg by mouth daily.   diclofenac 50 MG EC tablet Commonly known as:  VOLTAREN Take 50 mg by mouth 2 (two) times daily.   docusate sodium 100 MG capsule Commonly known as:   COLACE Take 100 mg by mouth 2 (two) times daily as needed for constipation.   hydrochlorothiazide 25 MG tablet Commonly known as:  HYDRODIURIL Take 25 mg by mouth daily.   lisinopril 5 MG tablet Commonly known as:  PRINIVIL,ZESTRIL Take 0.5 tablets (2.5 mg total) by mouth daily.   metaxalone 800 MG tablet Commonly known as:  SKELAXIN Take 0.5 tablets (400 mg total) by mouth 3 (three) times daily.   morphine 15 MG tablet Commonly known as:  MSIR Take 15 mg by mouth every 4 (four) hours as needed for pain.   nicotine 7 mg/24hr patch Commonly known as:  NICODERM CQ - dosed in mg/24 hr Place 1 patch (7 mg total) onto the skin daily. Start taking on:  12/31/2015   traMADol 50 MG tablet Commonly known as:  ULTRAM Take 1 tablet (50 mg total) by mouth every 6 (six) hours as needed. What changed:  reasons to take this      [place]. Schedule an appointment as soon as possible for a visit today.   Specialty:  General Practice Why:  As needed. Contact information: [place] [address] [phone]            Time coordinating discharge: 25 minutes.  Signed:  Nuh Lipton  Pager [phone] [facility] 12/30/2015, 10:27 AM

## 2015-12-30 NOTE — Discharge Instructions (Signed)

## 2016-09-05 ENCOUNTER — Encounter (HOSPITAL_COMMUNITY): Payer: Self-pay | Admitting: Emergency Medicine

## 2016-09-05 ENCOUNTER — Emergency Department (HOSPITAL_COMMUNITY)
Admission: EM | Admit: 2016-09-05 | Discharge: 2016-09-05 | Disposition: A | Discharge status: Home / Self Care | Payer: Medicaid Other | Attending: Emergency Medicine | Admitting: Emergency Medicine

## 2016-09-05 DIAGNOSIS — R51 Headache: Secondary | ICD-10-CM | POA: Insufficient documentation

## 2016-09-05 DIAGNOSIS — F17210 Nicotine dependence, cigarettes, uncomplicated: Secondary | ICD-10-CM | POA: Diagnosis not present

## 2016-09-05 DIAGNOSIS — Z5321 Procedure and treatment not carried out due to patient leaving prior to being seen by health care provider: Secondary | ICD-10-CM | POA: Insufficient documentation

## 2016-09-05 DIAGNOSIS — I10 Essential (primary) hypertension: Secondary | ICD-10-CM | POA: Insufficient documentation

## 2016-09-05 DIAGNOSIS — Z7982 Long term (current) use of aspirin: Secondary | ICD-10-CM | POA: Diagnosis not present

## 2016-09-05 DIAGNOSIS — Z8546 Personal history of malignant neoplasm of prostate: Secondary | ICD-10-CM | POA: Diagnosis not present

## 2016-09-05 NOTE — ED Notes (Addendum)
Pt was heard talking to registration. Pt stating that he has a headache and hasn't been seen by a doctor. This RN advised pt that the EDP had signed up for the pt and would see him shortly. Pt currently on the phone with a family member at registration and walked out of the ED. Pt heard telling the family member to "hurry up and come get him."

## 2016-09-05 NOTE — ED Notes (Signed)
Pt had been roomed in room 19 but had not been seen by a doctor.

## 2016-09-05 NOTE — ED Triage Notes (Signed)
Sharp pain in right side of head that started while patient was waiting for his son in the waiting room

## 2016-09-14 ENCOUNTER — Encounter (HOSPITAL_COMMUNITY): Payer: Self-pay | Admitting: *Deleted

## 2016-09-14 ENCOUNTER — Emergency Department (HOSPITAL_COMMUNITY): Payer: Medicaid Other

## 2016-09-14 ENCOUNTER — Emergency Department (HOSPITAL_COMMUNITY)
Admission: EM | Admit: 2016-09-14 | Discharge: 2016-09-14 | Disposition: A | Discharge status: Home / Self Care | Payer: Medicaid Other | Attending: Emergency Medicine | Admitting: Emergency Medicine

## 2016-09-14 DIAGNOSIS — B1710 Acute hepatitis C without hepatic coma: Secondary | ICD-10-CM | POA: Insufficient documentation

## 2016-09-14 DIAGNOSIS — Z79899 Other long term (current) drug therapy: Secondary | ICD-10-CM | POA: Diagnosis not present

## 2016-09-14 DIAGNOSIS — Z7901 Long term (current) use of anticoagulants: Secondary | ICD-10-CM | POA: Insufficient documentation

## 2016-09-14 DIAGNOSIS — I350 Nonrheumatic aortic (valve) stenosis: Secondary | ICD-10-CM | POA: Insufficient documentation

## 2016-09-14 DIAGNOSIS — Z7982 Long term (current) use of aspirin: Secondary | ICD-10-CM | POA: Insufficient documentation

## 2016-09-14 DIAGNOSIS — F17210 Nicotine dependence, cigarettes, uncomplicated: Secondary | ICD-10-CM | POA: Insufficient documentation

## 2016-09-14 DIAGNOSIS — R51 Headache: Secondary | ICD-10-CM | POA: Diagnosis not present

## 2016-09-14 DIAGNOSIS — Z8546 Personal history of malignant neoplasm of prostate: Secondary | ICD-10-CM | POA: Diagnosis not present

## 2016-09-14 DIAGNOSIS — I700 Atherosclerosis of aorta: Secondary | ICD-10-CM | POA: Diagnosis not present

## 2016-09-14 DIAGNOSIS — I10 Essential (primary) hypertension: Secondary | ICD-10-CM | POA: Diagnosis not present

## 2016-09-14 DIAGNOSIS — R519 Headache, unspecified: Secondary | ICD-10-CM

## 2016-09-14 LAB — URINALYSIS, ROUTINE W REFLEX MICROSCOPIC
BACTERIA UA: NONE SEEN
Bilirubin Urine: NEGATIVE
Glucose, UA: NEGATIVE mg/dL
Hgb urine dipstick: NEGATIVE
Ketones, ur: NEGATIVE mg/dL
Nitrite: NEGATIVE
PROTEIN: NEGATIVE mg/dL
Specific Gravity, Urine: 1.014 (ref 1.005–1.030)
pH: 5 (ref 5.0–8.0)

## 2016-09-14 LAB — CBC WITH DIFFERENTIAL/PLATELET
BASOS PCT: 0 %
Basophils Absolute: 0 10*3/uL (ref 0.0–0.1)
EOS ABS: 0.3 10*3/uL (ref 0.0–0.7)
Eosinophils Relative: 3 %
HEMATOCRIT: 50.3 % (ref 39.0–52.0)
Hemoglobin: 17.1 g/dL — ABNORMAL HIGH (ref 13.0–17.0)
LYMPHS ABS: 1.8 10*3/uL (ref 0.7–4.0)
Lymphocytes Relative: 21 %
MCH: 29.3 pg (ref 26.0–34.0)
MCHC: 34 g/dL (ref 30.0–36.0)
MCV: 86.1 fL (ref 78.0–100.0)
MONOS PCT: 4 %
Monocytes Absolute: 0.4 10*3/uL (ref 0.1–1.0)
Neutro Abs: 5.9 10*3/uL (ref 1.7–7.7)
Neutrophils Relative %: 72 %
Platelets: 135 10*3/uL — ABNORMAL LOW (ref 150–400)
RBC: 5.84 MIL/uL — ABNORMAL HIGH (ref 4.22–5.81)
RDW: 14.4 % (ref 11.5–15.5)
WBC: 8.3 10*3/uL (ref 4.0–10.5)

## 2016-09-14 LAB — COMPREHENSIVE METABOLIC PANEL
ALBUMIN: 4.5 g/dL (ref 3.5–5.0)
ALT: 22 U/L (ref 17–63)
AST: 21 U/L (ref 15–41)
Alkaline Phosphatase: 97 U/L (ref 38–126)
Anion gap: 10 (ref 5–15)
BILIRUBIN TOTAL: 0.8 mg/dL (ref 0.3–1.2)
BUN: 16 mg/dL (ref 6–20)
CO2: 28 mmol/L (ref 22–32)
CREATININE: 0.87 mg/dL (ref 0.61–1.24)
Calcium: 9.6 mg/dL (ref 8.9–10.3)
Chloride: 99 mmol/L — ABNORMAL LOW (ref 101–111)
GFR calc non Af Amer: >60 mL/min (ref >60)
Glucose, Bld: 137 mg/dL — ABNORMAL HIGH (ref 65–99)
POTASSIUM: 3.8 mmol/L (ref 3.5–5.1)
Sodium: 137 mmol/L (ref 135–145)
Total Protein: 8.1 g/dL (ref 6.5–8.1)

## 2016-09-14 LAB — RAPID URINE DRUG SCREEN, HOSP PERFORMED
AMPHETAMINES: NOT DETECTED
BENZODIAZEPINES: NOT DETECTED
Barbiturates: NOT DETECTED
COCAINE: NOT DETECTED
OPIATES: NOT DETECTED
Tetrahydrocannabinol: POSITIVE — AB

## 2016-09-14 LAB — ETHANOL: Alcohol, Ethyl (B): <5 mg/dL (ref <5)

## 2016-09-14 MED ORDER — KETOROLAC TROMETHAMINE 30 MG/ML IJ SOLN
30.0000 mg | Freq: Once | INTRAMUSCULAR | Status: AC
  Administered 2016-09-14: 30 mg via INTRAVENOUS
  Filled 2016-09-14: qty 1

## 2016-09-14 MED ORDER — PROCHLORPERAZINE EDISYLATE 5 MG/ML IJ SOLN
10.0000 mg | Freq: Once | INTRAMUSCULAR | Status: AC
  Administered 2016-09-14: 10 mg via INTRAVENOUS
  Filled 2016-09-14: qty 2

## 2016-09-14 MED ORDER — SODIUM CHLORIDE 0.9 % IV BOLUS (SEPSIS)
1000.0000 mL | Freq: Once | INTRAVENOUS | Status: AC
  Administered 2016-09-14: 1000 mL via INTRAVENOUS

## 2016-09-14 MED ORDER — HYDRALAZINE HCL 20 MG/ML IJ SOLN
20.0000 mg | Freq: Once | INTRAMUSCULAR | Status: AC
  Administered 2016-09-14: 20 mg via INTRAVENOUS
  Filled 2016-09-14: qty 1

## 2016-09-14 MED ORDER — TETRACAINE HCL 0.5 % OP SOLN
OPHTHALMIC | Status: AC
  Filled 2016-09-14: qty 4

## 2016-09-14 MED ORDER — TETRACAINE HCL 0.5 % OP SOLN
1.0000 [drp] | Freq: Once | OPHTHALMIC | Status: AC
  Administered 2016-09-14: 1 [drp] via OPHTHALMIC

## 2016-09-14 MED ORDER — PROPARACAINE HCL 0.5 % OP SOLN
1.0000 [drp] | Freq: Once | OPHTHALMIC | Status: DC
  Filled 2016-09-14: qty 15

## 2016-09-14 NOTE — Discharge Instructions (Signed)

## 2016-09-14 NOTE — ED Provider Notes (Signed)
[facility] DEPT Provider Note   CSN: [id number] Arrival date & time: 09/14/16  1528     History   Chief Complaint Chief Complaint  Patient presents with  . Headache    HPI Zachary Briggs is a 61 y.o. male.  HPI  The patient was a 61 year old male, history of back pain status post surgery in the past, history of hepatitis, history of hypertension, history of valve disease and a history of aortic stenosis. The patient reports that he has had a headache intermittently since June 29, he states that it started very suddenly at that time, he reports that it does go away completely at times during the day for maybe 30 or 40 minutes but then comes back. The headache is on the right side, it is located on the temporal and periorbital area, it is sharp and stabbing and throbbing and severe at times. He reports a general change in vision over time but has not seen the eye doctor. This does not correlate with his headaches. He has no numbness or weakness of the 4 extremities other than some chronic pain in his legs from his low back pain. He denies fevers, chills, nausea, vomiting, diarrhea and has no abdominal pain chest pain or shortness of breath. He has taken a whole bottle of Aleve as well as taking Ultram multiple doses and states that this is minimally helpful for his headache. He cannot identify any specific alleviating or exacerbating factors. He came approximately a week ago but did not stay for evaluation and left without being seen.  Past Medical History:  Diagnosis Date  . Aortic atherosclerosis ([place]) 12/29/2015  . Aortic stenosis 12/30/2015   AVA around 1.3-1.4 cm2, trivial MR, normal LA   size, normal IVC.  Marland Kitchen Back pain   . Bulla of lung ([place]) 12/29/2015  . Heart murmur   . Heart valve disease   . Hepatitis C   . Hypertension   . Prediabetes 12/29/2015  . Prostate cancer ([place])   . Tobacco abuse     Patient Active Problem List   Diagnosis Date Noted  . Aortic stenosis  12/30/2015  . Aortic atherosclerosis ([place]) 12/29/2015  . Bulla of lung ([place]) 12/29/2015  . Prediabetes 12/29/2015  . Chest pain 12/28/2015  . Back pain 12/28/2015  . Hypertension   . Tobacco abuse     Past Surgical History:  Procedure Laterality Date  . BACK SURGERY         Home Medications    Prior to Admission medications   Medication Sig Start Date End Date Taking? Authorizing Provider  aspirin EC 81 MG tablet Take 81 mg by mouth daily.   Yes [provider]  atorvastatin (LIPITOR) 10 MG tablet Take 5 mg by mouth daily.   Yes [provider]  cholecalciferol (VITAMIN D) 1000 units tablet Take 1,000 Units by mouth daily.   Yes [provider]  doxazosin (CARDURA) 8 MG tablet Take 8 mg by mouth at bedtime.   Yes [provider]  hydrochlorothiazide (HYDRODIURIL) 25 MG tablet Take 25 mg by mouth daily.   Yes [provider]  methocarbamol (ROBAXIN) 500 MG tablet Take 500 mg by mouth every 6 (six) hours as needed for muscle spasms.   Yes [provider]  metoprolol tartrate (LOPRESSOR) 25 MG tablet Take 12.5 mg by mouth 2 (two) times daily.   Yes [provider]  omeprazole (PRILOSEC) 20 MG capsule Take 20 mg by mouth 2 (two) times daily.  Yes [provider]  oxybutynin (DITROPAN) 5 MG tablet Take 5 mg by mouth daily.   Yes [provider]  pregabalin (LYRICA) 150 MG capsule Take 150 mg by mouth 2 (two) times daily.   Yes [provider]  tamsulosin (FLOMAX) 0.4 MG CAPS capsule Take 0.4 mg by mouth at bedtime.   Yes [provider]  traMADol (ULTRAM) 50 MG tablet Take 1 tablet (50 mg total) by mouth every 6 (six) hours as needed. Patient taking differently: Take 50 mg by mouth every 6 (six) hours as needed for moderate pain.  10/06/14  Yes Montine Circle, PA-C  valsartan (DIOVAN) 80 MG tablet Take 80 mg by mouth daily.   Yes [provider]  vitamin B-12 (CYANOCOBALAMIN)  1000 MCG tablet Take 1,000 mcg by mouth daily.   Yes [provider]    Family History Family History  Problem Relation Age of Onset  . Cancer Mother   . Cancer Father   . Cancer Sister   . Diabetes Mellitus II Brother     Social History Social History  Substance Use Topics  . Smoking status: Current Every Day Smoker    Types: Cigarettes  . Smokeless tobacco: Never Used  . Alcohol use 2.4 oz/week    4 Cans of beer per week     Allergies   Patient has no known allergies.   Review of Systems Review of Systems  Constitutional: Negative for chills and fever.  HENT: Positive for dental problem ( pain in the teeth intermittent - all the R sided upper and lower teeth.). Negative for ear pain, rhinorrhea, sinus pain, sinus pressure and sore throat.   Eyes: Positive for visual disturbance (gradual blurred vision over time). Negative for photophobia, pain and redness.  Respiratory: Negative for cough, chest tightness, shortness of breath and wheezing.   Cardiovascular: Negative for chest pain and leg swelling.  Gastrointestinal: Negative for abdominal pain, diarrhea, nausea and vomiting.  Genitourinary: Negative for difficulty urinating, dysuria and frequency.  Musculoskeletal: Positive for back pain ( chronic). Negative for gait problem, joint swelling, neck pain and neck stiffness.  Skin: Negative for rash.  Neurological: Positive for headaches. Negative for seizures, syncope, speech difficulty, weakness and numbness.  Hematological: Negative for adenopathy.  Psychiatric/Behavioral: Negative for behavioral problems and confusion.     Physical Exam Updated Vital Signs BP 131/88 (BP Location: Left Arm)   Pulse (!) 55   Temp 97.6 F (36.4 C) (Oral)   Resp 18   Ht 5\' 6"  (1.676 m)   Wt 74.8 kg (165 lb)   SpO2 99%   BMI 26.63 kg/m   Physical Exam  Constitutional: He appears well-developed and well-nourished. No distress.  HENT:  Head: Normocephalic and  atraumatic.  Mouth/Throat: Oropharynx is clear and moist. No oropharyngeal exudate.  The oropharynx is clear, moist, his dentition is generally poor but there is no P repeat odontoid swelling, gingival swelling or tenderness over the teeth or the gums. No trismus or torticollis and no difficulty with opening and closing the mouth. No malocclusion.  Eyes: Conjunctivae and EOM are normal. Pupils are equal, round, and reactive to light. Right eye exhibits no discharge. Left eye exhibits no discharge. No scleral icterus.  Pupils are 1-2 mm bilaterally, nonreactive, normal extraocular movements, normal gross visual acuity and peripheral visual fields.  Neck: Normal range of motion. Neck supple. No JVD present. No thyromegaly present.  Very supple neck with no lymphadenopathy  Cardiovascular: Normal rate, regular rhythm and intact  distal pulses.  Exam reveals no gallop and no friction rub.   Murmur ( Soft murmur) heard. Pulmonary/Chest: Effort normal and breath sounds normal. No respiratory distress. He has no wheezes. He has no rales.  Abdominal: Soft. Bowel sounds are normal. He exhibits no distension and no mass. There is no tenderness.  Musculoskeletal: Normal range of motion. He exhibits no edema or tenderness.  There is absolutely no tenderness over the 4 extremities, the compartments are very soft and the joints are very supple diffusely. He is able to straight leg raise bilaterally without difficulty and has normal range of motion of his neck which is very supple.  Lymphadenopathy:    He has no cervical adenopathy.  Neurological: He is alert. Coordination normal.  The patient is able to follow commands, his speech is clear, he appears tired and sleepy but is able to follow all of my commands including arms and legs and cranial nerves III through XII appear normal. Memory is intact. Coordination is normal by finger-nose-finger.  Skin: Skin is warm and dry. No rash noted. No erythema.  There is no  rash redness petechiae or purpura  Psychiatric: He has a normal mood and affect. His behavior is normal.  Nursing note and vitals reviewed.    ED Treatments / Results  Labs (all labs ordered are listed, but only abnormal results are displayed) Labs Reviewed  CBC WITH DIFFERENTIAL/PLATELET - Abnormal; Notable for the following:       Result Value   RBC 5.84 (*)    Hemoglobin 17.1 (*)    Platelets 135 (*)    All other components within normal limits  COMPREHENSIVE METABOLIC PANEL - Abnormal; Notable for the following:    Chloride 99 (*)    Glucose, Bld 137 (*)    All other components within normal limits  URINALYSIS, ROUTINE W REFLEX MICROSCOPIC - Abnormal; Notable for the following:    Leukocytes, UA TRACE (*)    Squamous Epithelial / LPF 0-5 (*)    All other components within normal limits  RAPID URINE DRUG SCREEN, HOSP PERFORMED - Abnormal; Notable for the following:    Tetrahydrocannabinol POSITIVE (*)    All other components within normal limits  ETHANOL      Radiology Ct Head Wo Contrast  Result Date: 09/14/2016 CLINICAL DATA:  61 y/o  M; right-sided headache. EXAM: CT HEAD WITHOUT CONTRAST TECHNIQUE: Contiguous axial images were obtained from the base of the skull through the vertex without intravenous contrast. COMPARISON:  08/17/2006 CT head FINDINGS: Brain: No evidence of acute infarction, hemorrhage, hydrocephalus, extra-axial collection or mass lesion/mass effect. Vascular: No hyperdense vessel or unexpected calcification. Skull: Normal. Negative for fracture or focal lesion. Sinuses/Orbits: Mild diffuse paranasal sinus mucosal thickening with mucous retention cysts in the left maxillary and sphenoid sinuses. Trace right mastoid effusion. Orbits are unremarkable. Other: None. IMPRESSION: 1. No acute intracranial abnormality identified. 2. Mild paranasal sinus disease and trace right mastoid effusion. 3. Otherwise unremarkable CT of the head for age. Electronically Signed    By: Kristine Garbe M.D.   On: 09/14/2016 17:20    Procedures Procedures (including critical care time)  Medications Ordered in ED Medications  tetracaine (PONTOCAINE) 0.5 % ophthalmic solution (not administered)  ketorolac (TORADOL) 30 MG/ML injection 30 mg (30 mg Intravenous Given 09/14/16 1742)  prochlorperazine (COMPAZINE) injection 10 mg (10 mg Intravenous Given 09/14/16 1742)  sodium chloride 0.9 % bolus 1,000 mL (1,000 mLs Intravenous New Bag/Given 09/14/16 1742)  hydrALAZINE (APRESOLINE) injection 20 mg (  20 mg Intravenous Given 09/14/16 1823)  tetracaine (PONTOCAINE) 0.5 % ophthalmic solution 1 drop (1 drop Both Eyes Given by Other 09/14/16 1847)     Initial Impression / Assessment and Plan / ED Course  I have reviewed the triage vital signs and the nursing notes.  Pertinent labs & imaging results that were available during my care of the patient were reviewed by me and considered in my medical decision making (see chart for details).     The patient generally appears uncomfortable however at this time there is no definite etiology to the source of his headache. Review of the medical record shows no prior neuroimaging but was 7-8 days of a persistent headache and someone who rarely has headaches like this though he does report having intermittent headaches over time I feel that it would benefit him to have a CT scan of the brain. I will also evaluate him for drug use as cocaine could induce this, renal function, electrolytes and blood counts. The patient is agreeable to the evaluation with medications for the headache, workup with imaging and labs.  Will also check IOP to r/o Glaucoma  IOP was 21,17,24.  Improved with BP meds (initially > 200 sys), toradol and compazine  Improved with meds On several BP meds Pt encouraged to have clsoe f/u for headache and hypertension  Vitals:   09/14/16 1808 09/14/16 1811 09/14/16 1823 09/14/16 1830  BP: (!) 216/97  (!) 200/82 (!)  164/80  Pulse:  (!) 54  (!) 50  Temp:      TempSrc:      SpO2:  93%  100%  Weight:      Height:       Vitals:   09/14/16 1830 09/14/16 1908  BP: (!) 164/80 131/88  Pulse: (!) 50 (!) 55  Resp:  18  Temp:       Final Clinical Impressions(s) / ED Diagnoses   Final diagnoses:  Right temporal headache  Essential hypertension     New Prescriptions New Prescriptions   No medications on file     Noemi Chapel, MD 09/14/16 1958

## 2016-09-14 NOTE — ED Notes (Signed)
Went into patients room patient is noted to have snoring respirations. Patient is easily arrousable.

## 2016-09-14 NOTE — ED Triage Notes (Signed)
Pt comes in with a headache starting today around 1300. Pt states he has taken aleve with no relief. Pt has hx of the same (on 6/29 in this ED but he left before being seen). Pt denies any unilateral weakness.

## 2016-09-30 ENCOUNTER — Emergency Department (HOSPITAL_COMMUNITY)
Admission: EM | Admit: 2016-09-30 | Discharge: 2016-09-30 | Disposition: A | Discharge status: Home / Self Care | Payer: Medicaid Other | Attending: Emergency Medicine | Admitting: Emergency Medicine

## 2016-09-30 ENCOUNTER — Encounter (HOSPITAL_COMMUNITY): Payer: Self-pay | Admitting: Emergency Medicine

## 2016-09-30 DIAGNOSIS — Z8546 Personal history of malignant neoplasm of prostate: Secondary | ICD-10-CM | POA: Insufficient documentation

## 2016-09-30 DIAGNOSIS — F17210 Nicotine dependence, cigarettes, uncomplicated: Secondary | ICD-10-CM | POA: Diagnosis not present

## 2016-09-30 DIAGNOSIS — Z79899 Other long term (current) drug therapy: Secondary | ICD-10-CM | POA: Insufficient documentation

## 2016-09-30 DIAGNOSIS — Z7982 Long term (current) use of aspirin: Secondary | ICD-10-CM | POA: Diagnosis not present

## 2016-09-30 DIAGNOSIS — R519 Headache, unspecified: Secondary | ICD-10-CM

## 2016-09-30 DIAGNOSIS — I10 Essential (primary) hypertension: Secondary | ICD-10-CM | POA: Insufficient documentation

## 2016-09-30 DIAGNOSIS — R51 Headache: Secondary | ICD-10-CM | POA: Diagnosis present

## 2016-09-30 MED ORDER — DIPHENHYDRAMINE HCL 25 MG PO CAPS
50.0000 mg | ORAL_CAPSULE | Freq: Once | ORAL | Status: AC
  Administered 2016-09-30: 50 mg via ORAL
  Filled 2016-09-30: qty 2

## 2016-09-30 MED ORDER — METOCLOPRAMIDE HCL 5 MG/ML IJ SOLN
10.0000 mg | Freq: Once | INTRAMUSCULAR | Status: AC
  Administered 2016-09-30: 10 mg via INTRAMUSCULAR
  Filled 2016-09-30: qty 2

## 2016-09-30 MED ORDER — METOCLOPRAMIDE HCL 10 MG PO TABS: 10.0000 mg | ORAL_TABLET | Freq: Four times a day (QID) | ORAL | 0 refills | Status: DC | PRN

## 2016-09-30 MED ORDER — KETOROLAC TROMETHAMINE 60 MG/2ML IM SOLN
30.0000 mg | Freq: Once | INTRAMUSCULAR | Status: AC
  Administered 2016-09-30: 30 mg via INTRAMUSCULAR
  Filled 2016-09-30: qty 2

## 2016-09-30 NOTE — ED Provider Notes (Signed)
[facility] DEPT Provider Note   CSN: [id number] Arrival date & time: 09/30/16  1138     History   Chief Complaint Chief Complaint  Patient presents with  . Headache    HPI Zachary Briggs is a 61 y.o. male.  HPI  Pt was seen at 1525. Per pt, c/o gradual onset and persistence of constant headache for the past 3 days. Describes the headache as located in his right forehead area, and per his previous headache pain pattern for the past 1 month. Pt was evaluated in the ED 2 weeks ago for this headache with reassuring workup. Pt states he also has been evaluated by his PMD and believes he is being referred to a Neurologist.  Denies headache was sudden or maximal in onset or at any time.  Denies visual changes, no focal motor weakness, no tingling/numbness in extremities, no fevers, no neck pain, no rash.     Past Medical History:  Diagnosis Date  . Aortic atherosclerosis ([place]) 12/29/2015  . Aortic stenosis 12/30/2015   AVA around 1.3-1.4 cm2, trivial MR, normal LA   size, normal IVC.  Marland Kitchen Back pain   . 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 Heart murmur   . Heart valve disease   . Hepatitis C   . Hypertension   . Prediabetes 12/29/2015  . Prostate cancer ([place])   . Tobacco abuse     Patient Active Problem List   Diagnosis Date Noted  . Aortic stenosis 12/30/2015  . Aortic atherosclerosis ([place]) 12/29/2015  . 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 Prediabetes 12/29/2015  . Chest pain 12/28/2015  . Back pain 12/28/2015  . Hypertension   . Tobacco abuse     Past Surgical History:  Procedure Laterality Date  . BACK SURGERY        Home Medications    Prior to Admission medications   Medication Sig Start Date End Date Taking? Authorizing Provider  aspirin EC 81 MG tablet Take 81 mg by mouth daily.    [provider]  atorvastatin (LIPITOR) 10 MG tablet Take 5 mg by mouth daily.    [provider]  cholecalciferol (VITAMIN D) 1000 units tablet Take 1,000  Units by mouth daily.    [provider]  doxazosin (CARDURA) 8 MG tablet Take 8 mg by mouth at bedtime.    [provider]  hydrochlorothiazide (HYDRODIURIL) 25 MG tablet Take 25 mg by mouth daily.    [provider]  methocarbamol (ROBAXIN) 500 MG tablet Take 500 mg by mouth every 6 (six) hours as needed for muscle spasms.    [provider]  metoprolol tartrate (LOPRESSOR) 25 MG tablet Take 12.5 mg by mouth 2 (two) times daily.    [provider]  omeprazole (PRILOSEC) 20 MG capsule Take 20 mg by mouth 2 (two) times daily.    [provider]  oxybutynin (DITROPAN) 5 MG tablet Take 5 mg by mouth daily.    [provider]  pregabalin (LYRICA) 150 MG capsule Take 150 mg by mouth 2 (two) times daily.    [provider]  tamsulosin (FLOMAX) 0.4 MG CAPS capsule Take 0.4 mg by mouth at bedtime.    [provider]  traMADol (ULTRAM) 50 MG tablet Take 1 tablet (50 mg total) by mouth every 6 (six) hours as needed. Patient taking differently: Take 50 mg by mouth every 6 (six) hours as needed for moderate pain.  10/06/14   Montine Circle, PA-C  valsartan (  DIOVAN) 80 MG tablet Take 80 mg by mouth daily.    [provider]  vitamin B-12 (CYANOCOBALAMIN) 1000 MCG tablet Take 1,000 mcg by mouth daily.    [provider]    Family History Family History  Problem Relation Age of Onset  . Cancer Mother   . Cancer Father   . Cancer Sister   . Diabetes Mellitus II Brother     Social History Social History  Substance Use Topics  . Smoking status: Current Every Day Smoker    Types: Cigarettes  . Smokeless tobacco: Never Used  . Alcohol use 2.4 oz/week    4 Cans of beer per week     Allergies   Patient has no known allergies.   Review of Systems Review of Systems ROS: Statement: All systems negative except as marked or noted in the HPI; Constitutional: Negative for fever and chills. ; ; Eyes:  Negative for eye pain, redness and discharge. ; ; ENMT: Negative for ear pain, hoarseness, nasal congestion, sinus pressure and sore throat. ; ; Cardiovascular: Negative for chest pain, palpitations, diaphoresis, dyspnea and peripheral edema. ; ; Respiratory: Negative for cough, wheezing and stridor. ; ; Gastrointestinal: Negative for nausea, vomiting, diarrhea, abdominal pain, blood in stool, hematemesis, jaundice and rectal bleeding. . ; ; Genitourinary: Negative for dysuria, flank pain and hematuria. ; ; Musculoskeletal: Negative for back pain and neck pain. Negative for swelling and trauma.; ; Skin: Negative for pruritus, rash, abrasions, blisters, bruising and skin lesion.; ; Neuro: +headache. Negative for lightheadedness and neck stiffness. Negative for weakness, altered level of consciousness, altered mental status, extremity weakness, paresthesias, involuntary movement, seizure and syncope.       Physical Exam Updated Vital Signs BP (!) 189/92 (BP Location: Right Arm)   Pulse (!) 57   Temp 98.3 F (36.8 C) (Oral)   Resp 20   Ht 5\' 6"  (1.676 m)   Wt 74.8 kg (165 lb)   SpO2 100%   BMI 26.63 kg/m   Physical Exam 1530: Physical examination:  Nursing notes reviewed; Vital signs and O2 SAT reviewed;  Constitutional: Well developed, Well nourished, Well hydrated, In no acute distress. Using cellphone without difficulty; Head:  Normocephalic, atraumatic; Eyes: EOMI, PERRL, No scleral icterus; ENMT: TM's clear bilat. +edemetous nasal turbinates bilat with clear rhinorrhea. Mouth and pharynx normal, Mucous membranes moist; Neck: Supple, Full range of motion, No lymphadenopathy; Cardiovascular: Regular rate and rhythm, No gallop; Respiratory: Breath sounds clear & equal bilaterally, No wheezes.  Speaking full sentences with ease, Normal respiratory effort/excursion; Chest: Nontender, Movement normal; Abdomen: Soft, Nontender, Nondistended, Normal bowel sounds; Genitourinary: No CVA tenderness;  Extremities: Pulses normal, No tenderness, No edema, No calf edema or asymmetry.; Neuro: AA&Ox3, Major CN grossly intact. No facial droop. Speech clear. No gross focal motor or sensory deficits in extremities. Climbs on and off stretcher easily by himself. Gait steady.; Skin: Color normal, Warm, Dry.   ED Treatments / Results  Labs (all labs ordered are listed, but only abnormal results are displayed)   EKG  EKG Interpretation None       Radiology   Procedures Procedures (including critical care time)  Medications Ordered in ED Medications  ketorolac (TORADOL) injection 30 mg (not administered)  metoCLOPramide (REGLAN) injection 10 mg (not administered)  diphenhydrAMINE (BENADRYL) capsule 50 mg (not administered)     Initial Impression / Assessment and Plan / ED Course  I have reviewed the triage vital signs and the nursing notes.  Pertinent labs &  imaging results that were available during my care of the patient were reviewed by me and considered in my medical decision making (see chart for details).  MDM Reviewed: previous chart, nursing note and vitals Reviewed previous: labs and CT scan    1605:  I explained to pt after my exam which medications I was going to use to treat his headache; pt verb understanding and then stated he was "calling for my ride now."  ED RN just gave pt his meds and he states he feels better and wants to go home now; states his "ride is here."  Tx symptomatically, f/u PMD and Neuro MD for good continuity of care and control of his chronic/recurrent symptoms. Dx d/w pt.  Questions answered.  Verb understanding, agreeable to d/c home with outpt f/u.      Final Clinical Impressions(s) / ED Diagnoses   Final diagnoses:  None    New Prescriptions New Prescriptions   No medications on file     Francine Graven, DO 10/03/16 1513

## 2016-09-30 NOTE — ED Triage Notes (Signed)
Headache x 3 days,  Has been taking 800mg  ibuprofen with out relief

## 2016-09-30 NOTE — Discharge Instructions (Signed)
Take over the counter tylenol, ibuprofen and benadryl, as directed on packaging, with the prescription given to you today, as needed for headache.  Keep a headache diary.  Call your regular medical doctor today to schedule a follow up appointment within the next 3  days.  Call the Neurologist today to schedule a follow up appointment within the next week. Return to the Emergency Department immediately sooner if worsening.

## 2017-01-21 ENCOUNTER — Emergency Department (HOSPITAL_COMMUNITY): Payer: Medicaid Other

## 2017-01-21 ENCOUNTER — Encounter (HOSPITAL_COMMUNITY): Payer: Self-pay

## 2017-01-21 ENCOUNTER — Emergency Department (HOSPITAL_COMMUNITY)
Admission: EM | Admit: 2017-01-21 | Discharge: 2017-01-21 | Disposition: A | Discharge status: Home / Self Care | Payer: Medicaid Other | Attending: Emergency Medicine | Admitting: Emergency Medicine

## 2017-01-21 DIAGNOSIS — Z79899 Other long term (current) drug therapy: Secondary | ICD-10-CM | POA: Insufficient documentation

## 2017-01-21 DIAGNOSIS — I10 Essential (primary) hypertension: Secondary | ICD-10-CM | POA: Insufficient documentation

## 2017-01-21 DIAGNOSIS — R1011 Right upper quadrant pain: Secondary | ICD-10-CM | POA: Diagnosis not present

## 2017-01-21 DIAGNOSIS — Z7982 Long term (current) use of aspirin: Secondary | ICD-10-CM | POA: Diagnosis not present

## 2017-01-21 DIAGNOSIS — F17290 Nicotine dependence, other tobacco product, uncomplicated: Secondary | ICD-10-CM | POA: Insufficient documentation

## 2017-01-21 DIAGNOSIS — R109 Unspecified abdominal pain: Secondary | ICD-10-CM

## 2017-01-21 LAB — URINALYSIS, ROUTINE W REFLEX MICROSCOPIC
BILIRUBIN URINE: NEGATIVE
Bacteria, UA: NONE SEEN
GLUCOSE, UA: NEGATIVE mg/dL
Hgb urine dipstick: NEGATIVE
Ketones, ur: NEGATIVE mg/dL
NITRITE: NEGATIVE
PH: 5 (ref 5.0–8.0)
Protein, ur: NEGATIVE mg/dL
SPECIFIC GRAVITY, URINE: 1.023 (ref 1.005–1.030)

## 2017-01-21 LAB — CBC WITH DIFFERENTIAL/PLATELET
BASOS ABS: 0 10*3/uL (ref 0.0–0.1)
BASOS PCT: 1 %
EOS ABS: 0.4 10*3/uL (ref 0.0–0.7)
EOS PCT: 6 %
HCT: 45.4 % (ref 39.0–52.0)
Hemoglobin: 14.8 g/dL (ref 13.0–17.0)
LYMPHS PCT: 39 %
Lymphs Abs: 2.5 10*3/uL (ref 0.7–4.0)
MCH: 28.4 pg (ref 26.0–34.0)
MCHC: 32.6 g/dL (ref 30.0–36.0)
MCV: 87.1 fL (ref 78.0–100.0)
Monocytes Absolute: 0.5 10*3/uL (ref 0.1–1.0)
Monocytes Relative: 8 %
NEUTROS PCT: 46 %
Neutro Abs: 3.1 10*3/uL (ref 1.7–7.7)
PLATELETS: 166 10*3/uL (ref 150–400)
RBC: 5.21 MIL/uL (ref 4.22–5.81)
RDW: 15.2 % (ref 11.5–15.5)
WBC: 6.5 10*3/uL (ref 4.0–10.5)

## 2017-01-21 LAB — COMPREHENSIVE METABOLIC PANEL
ALT: 17 U/L (ref 17–63)
ANION GAP: 7 (ref 5–15)
AST: 18 U/L (ref 15–41)
Albumin: 4.2 g/dL (ref 3.5–5.0)
Alkaline Phosphatase: 91 U/L (ref 38–126)
BUN: 11 mg/dL (ref 6–20)
CHLORIDE: 105 mmol/L (ref 101–111)
CO2: 25 mmol/L (ref 22–32)
Calcium: 9.2 mg/dL (ref 8.9–10.3)
Creatinine, Ser: 0.91 mg/dL (ref 0.61–1.24)
Glucose, Bld: 99 mg/dL (ref 65–99)
POTASSIUM: 3.2 mmol/L — AB (ref 3.5–5.1)
Sodium: 137 mmol/L (ref 135–145)
Total Bilirubin: 0.6 mg/dL (ref 0.3–1.2)
Total Protein: 7.8 g/dL (ref 6.5–8.1)

## 2017-01-21 LAB — LIPASE, BLOOD: LIPASE: 31 U/L (ref 11–51)

## 2017-01-21 MED ORDER — NAPROXEN 250 MG PO TABS: 250.0000 mg | ORAL_TABLET | Freq: Two times a day (BID) | ORAL | 0 refills | Status: DC | PRN

## 2017-01-21 NOTE — Discharge Instructions (Signed)
Take the prescription as directed.  Apply moist heat or ice to the area(s) of discomfort, for 15 minutes at a time, several times per day for the next few days.  Do not fall asleep on a heating or ice pack.  Call your regular medical doctor today to schedule a follow up appointment this week.  Return to the Emergency Department immediately if worsening. ° °

## 2017-01-21 NOTE — ED Triage Notes (Signed)
Pt is complaining of "nerve like" pain that is superficial on the surface. Stating it hurts on the central part of stomach that goes to his right part of his ribs. Is stating it burns.

## 2017-01-21 NOTE — ED Provider Notes (Signed)
[facility] EMERGENCY DEPARTMENT Provider Note   CSN: [id number] Arrival date & time: 01/21/17  1242     History   Chief Complaint No chief complaint on file.   HPI Zachary Briggs is a 61 y.o. male.  HPI Pt was seen at 1300. Per pt, c/o sudden onset and persistence of constant right sided torso "pain" that began 1 week ago.  Pt describes the pain as "burning," and associated with RUQ "pain."  Has been associated with no other symptoms.  Denies testicular pain/swelling, no dysuria/hematuria, no CP/SOB, no N/V, no diarrhea, no black or blood in stools, no CP/SOB, no fevers, no rash, no injury.    Past Medical History:  Diagnosis Date  . Aortic atherosclerosis ([place]) 12/29/2015  . Aortic stenosis 12/30/2015   AVA around 1.3-1.4 cm2, trivial MR, normal LA   size, normal IVC.  Marland Kitchen Back pain   . 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 Heart murmur   . Heart valve disease   . Hepatitis C   . Hypertension   . Prediabetes 12/29/2015  . Prostate cancer ([place])   . Tobacco abuse     Patient Active Problem List   Diagnosis Date Noted  . Aortic stenosis 12/30/2015  . Aortic atherosclerosis ([place]) 12/29/2015  . 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 Prediabetes 12/29/2015  . Chest pain 12/28/2015  . Back pain 12/28/2015  . Hypertension   . Tobacco abuse     Past Surgical History:  Procedure Laterality Date  . BACK SURGERY         Home Medications    Prior to Admission medications   Medication Sig Start Date End Date Taking? Authorizing Provider  aspirin EC 81 MG tablet Take 81 mg by mouth daily.   Yes [provider]  atorvastatin (LIPITOR) 10 MG tablet Take 5 mg by mouth daily.   Yes [provider]  cholecalciferol (VITAMIN D) 1000 units tablet Take 1,000 Units by mouth daily.   Yes [provider]  doxazosin (CARDURA) 8 MG tablet Take 8 mg by mouth at bedtime.   Yes [provider]  hydrochlorothiazide (HYDRODIURIL) 25 MG tablet Take 25 mg  by mouth daily.   Yes [provider]  methocarbamol (ROBAXIN) 500 MG tablet Take 500 mg by mouth every 6 (six) hours as needed for muscle spasms.   Yes [provider]  metoCLOPramide (REGLAN) 10 MG tablet Take 1 tablet (10 mg total) by mouth every 6 (six) hours as needed for nausea (or headache). 09/30/16  Yes Francine Graven, DO  metoprolol tartrate (LOPRESSOR) 25 MG tablet Take 12.5 mg by mouth 2 (two) times daily.   Yes [provider]  omeprazole (PRILOSEC) 20 MG capsule Take 20 mg by mouth 2 (two) times daily.   Yes [provider]  oxybutynin (DITROPAN) 5 MG tablet Take 5 mg by mouth daily.   Yes [provider]  pregabalin (LYRICA) 150 MG capsule Take 150 mg by mouth 2 (two) times daily.   Yes [provider]  tamsulosin (FLOMAX) 0.4 MG CAPS capsule Take 0.4 mg by mouth at bedtime.   Yes [provider]  traMADol (ULTRAM) 50 MG tablet Take 1 tablet (50 mg total) by mouth every 6 (six) hours as needed. Patient taking differently: Take 50 mg by mouth every 6 (six) hours as needed for moderate pain.  10/06/14  Yes Montine Circle, PA-C  valsartan (DIOVAN) 80 MG tablet Take 80 mg by mouth daily.   Yes  [provider]  vitamin B-12 (CYANOCOBALAMIN) 1000 MCG tablet Take 1,000 mcg by mouth daily.   Yes [provider]    Family History Family History  Problem Relation Age of Onset  . Cancer Mother   . Cancer Father   . Cancer Sister   . Diabetes Mellitus II Brother     Social History Social History   Tobacco Use  . Smoking status: Current Every Day Smoker    Types: Cigars  . Smokeless tobacco: Never Used  Substance Use Topics  . Alcohol use: Yes    Alcohol/week: 2.4 oz    Types: 4 Cans of beer per week  . Drug use: Yes    Types: Marijuana     Allergies   Patient has no known allergies.   Review of Systems Review of Systems ROS: Statement: All systems negative except as marked or noted in  the HPI; Constitutional: Negative for fever and chills. ; ; Eyes: Negative for eye pain, redness and discharge. ; ; ENMT: Negative for ear pain, hoarseness, nasal congestion, sinus pressure and sore throat. ; ; Cardiovascular: Negative for chest pain, palpitations, diaphoresis, dyspnea and peripheral edema. ; ; Respiratory: Negative for cough, wheezing and stridor. ; ; Gastrointestinal: +abd pain. Negative for nausea, vomiting, diarrhea, blood in stool, hematemesis, jaundice and rectal bleeding. . ; ; Genitourinary: +flank pain. Negative for dysuria and hematuria. ; ; Musculoskeletal: Negative for back pain and neck pain. Negative for swelling and trauma.; ; Genital:  No penile drainage or rash, no testicular pain or swelling, no scrotal rash or swelling. ;; Skin: Negative for pruritus, rash, abrasions, blisters, bruising and skin lesion.; ; Neuro: Negative for headache, lightheadedness and neck stiffness. Negative for weakness, altered level of consciousness, altered mental status, extremity weakness, paresthesias, involuntary movement, seizure and syncope.       Physical Exam Updated Vital Signs BP (!) 190/91 (BP Location: Right Arm)   Pulse 63   Temp 98.1 F (36.7 C) (Oral)   Resp 16   Ht 5\' 5"  (1.651 m)   Wt 74.8 kg (165 lb)   SpO2 98%   BMI 27.46 kg/m   Physical Exam 1305: Physical examination:  Nursing notes reviewed; Vital signs and O2 SAT reviewed;  Constitutional: Well developed, Well nourished, Well hydrated, In no acute distress; Head:  Normocephalic, atraumatic; Eyes: EOMI, PERRL, No scleral icterus; ENMT: Mouth and pharynx normal, Mucous membranes moist; Neck: Supple, Full range of motion, No lymphadenopathy; Cardiovascular: Regular rate and rhythm, No gallop; Respiratory: Breath sounds clear & equal bilaterally, No wheezes.  Speaking full sentences with ease, Normal respiratory effort/excursion; Chest: Nontender, Movement normal; Abdomen: Soft, +right sided torso and RUQ tenderness  to palp. No rebound or guarding. No rash. Nondistended, Normal bowel sounds; Genitourinary: No CVA tenderness; Spine:  No midline CS, TS, LS tenderness.;; Extremities: Pulses normal, No tenderness, No edema, No calf edema or asymmetry.; Neuro: AA&Ox3, Major CN grossly intact.  Speech clear. No gross focal motor or sensory deficits in extremities. Climbs on and off stretcher easily by himself. Gait steady..; Skin: Color normal, Warm, Dry.   ED Treatments / Results  Labs (all labs ordered are listed, but only abnormal results are displayed)   EKG  EKG Interpretation None       Radiology   Procedures Procedures (including critical care time)  Medications Ordered in ED Medications - No data to display   Initial Impression / Assessment and Plan / ED Course  I have reviewed the triage vital signs  and the nursing notes.  Pertinent labs & imaging results that were available during my care of the patient were reviewed by me and considered in my medical decision making (see chart for details).  MDM Reviewed: previous chart, nursing note and vitals Reviewed previous: labs Interpretation: labs, CT scan and x-ray    Results for orders placed or performed during the hospital encounter of 01/21/17  Comprehensive metabolic panel  Result Value Ref Range   Sodium 137 135 - 145 mmol/L   Potassium 3.2 (L) 3.5 - 5.1 mmol/L   Chloride 105 101 - 111 mmol/L   CO2 25 22 - 32 mmol/L   Glucose, Bld 99 65 - 99 mg/dL   BUN 11 6 - 20 mg/dL   Creatinine, Ser 0.91 0.61 - 1.24 mg/dL   Calcium 9.2 8.9 - 10.3 mg/dL   Total Protein 7.8 6.5 - 8.1 g/dL   Albumin 4.2 3.5 - 5.0 g/dL   AST 18 15 - 41 U/L   ALT 17 17 - 63 U/L   Alkaline Phosphatase 91 38 - 126 U/L   Total Bilirubin 0.6 0.3 - 1.2 mg/dL   GFR calc non Af Amer >60 >60 mL/min   GFR calc Af Amer >60 >60 mL/min   Anion gap 7 5 - 15  Lipase, blood  Result Value Ref Range   Lipase 31 11 - 51 U/L  CBC with Differential  Result Value Ref  Range   WBC 6.5 4.0 - 10.5 K/uL   RBC 5.21 4.22 - 5.81 MIL/uL   Hemoglobin 14.8 13.0 - 17.0 g/dL   HCT 45.4 39.0 - 52.0 %   MCV 87.1 78.0 - 100.0 fL   MCH 28.4 26.0 - 34.0 pg   MCHC 32.6 30.0 - 36.0 g/dL   RDW 15.2 11.5 - 15.5 %   Platelets 166 150 - 400 K/uL   Neutrophils Relative % 46 %   Neutro Abs 3.1 1.7 - 7.7 K/uL   Lymphocytes Relative 39 %   Lymphs Abs 2.5 0.7 - 4.0 K/uL   Monocytes Relative 8 %   Monocytes Absolute 0.5 0.1 - 1.0 K/uL   Eosinophils Relative 6 %   Eosinophils Absolute 0.4 0.0 - 0.7 K/uL   Basophils Relative 1 %   Basophils Absolute 0.0 0.0 - 0.1 K/uL  Urinalysis, Routine w reflex microscopic  Result Value Ref Range   Color, Urine YELLOW YELLOW   APPearance CLEAR CLEAR   Specific Gravity, Urine 1.023 1.005 - 1.030   pH 5.0 5.0 - 8.0   Glucose, UA NEGATIVE NEGATIVE mg/dL   Hgb urine dipstick NEGATIVE NEGATIVE   Bilirubin Urine NEGATIVE NEGATIVE   Ketones, ur NEGATIVE NEGATIVE mg/dL   Protein, ur NEGATIVE NEGATIVE mg/dL   Nitrite NEGATIVE NEGATIVE   Leukocytes, UA SMALL (A) NEGATIVE   RBC / HPF 0-5 0 - 5 RBC/hpf   WBC, UA 0-5 0 - 5 WBC/hpf   Bacteria, UA NONE SEEN NONE SEEN   Squamous Epithelial / LPF 0-5 (A) NONE SEEN   Mucus PRESENT    Dg Chest 2 View Result Date: 01/21/2017 CLINICAL DATA:  Right-sided upper abdominal pain. EXAM: CHEST  2 VIEW COMPARISON:  CT chest and chest x-ray dated December 28, 2015. FINDINGS: Mild cardiomegaly, unchanged. Normal pulmonary vascularity. No focal consolidation, pleural effusion, or pneumothorax. No acute osseous abnormality. IMPRESSION: Stable mild cardiomegaly.  No active cardiopulmonary disease. Electronically Signed   By: Titus Dubin M.D.   On: 01/21/2017 14:54   Ct Renal Stone Study  Result Date: 01/21/2017 CLINICAL DATA:  Acute right flank pain. EXAM: CT ABDOMEN AND PELVIS WITHOUT CONTRAST TECHNIQUE: Multidetector CT imaging of the abdomen and pelvis was performed following the standard protocol without IV  contrast. COMPARISON:  CT scan of August 26, 2012. FINDINGS: Lower chest: No acute abnormality. Hepatobiliary: No focal liver abnormality is seen. No gallstones, gallbladder wall thickening, or biliary dilatation. Pancreas: Unremarkable. No pancreatic ductal dilatation or surrounding inflammatory changes. Spleen: Normal in size without focal abnormality. Adrenals/Urinary Tract: Adrenal glands are unremarkable. Kidneys are normal, without renal calculi, focal lesion, or hydronephrosis. Bladder is unremarkable. Stomach/Bowel: Stomach is within normal limits. Appendix appears normal. No evidence of bowel wall thickening, distention, or inflammatory changes. Diverticulosis of descending and sigmoid colon is noted without inflammation. Vascular/Lymphatic: Aortic atherosclerosis. No enlarged abdominal or pelvic lymph nodes. Reproductive: Stable mild prostatic enlargement. Other: No abdominal wall hernia or abnormality. No abdominopelvic ascites. Musculoskeletal: No acute or significant osseous findings. IMPRESSION: Diverticulosis of descending and sigmoid colon without inflammation. Aortic atherosclerosis. Stable mild prostatic enlargement. No acute abnormality seen in the abdomen or pelvis. Electronically Signed   By: Marijo Conception, M.D.   On: 01/21/2017 14:57    1525:  Pt without rash. Workup reassuring. Pt has gotten himself dressed, is walking around his exam room and hallways, and wants to go home now. Dx and testing d/w pt.  Questions answered.  Verb understanding, agreeable to d/c home with outpt f/u.     Final Clinical Impressions(s) / ED Diagnoses   Final diagnoses:  None    ED Discharge Orders    None       Francine Graven, DO 01/23/17 1350

## 2017-01-23 LAB — URINE CULTURE: Culture: <10000 — AB

## 2017-05-16 ENCOUNTER — Emergency Department (HOSPITAL_COMMUNITY): Payer: Medicaid Other

## 2017-05-16 ENCOUNTER — Emergency Department (HOSPITAL_COMMUNITY)
Admission: EM | Admit: 2017-05-16 | Discharge: 2017-05-16 | Disposition: A | Discharge status: Home / Self Care | Payer: Medicaid Other | Attending: Emergency Medicine | Admitting: Emergency Medicine

## 2017-05-16 ENCOUNTER — Encounter (HOSPITAL_COMMUNITY): Payer: Self-pay | Admitting: Emergency Medicine

## 2017-05-16 DIAGNOSIS — R1013 Epigastric pain: Secondary | ICD-10-CM | POA: Insufficient documentation

## 2017-05-16 DIAGNOSIS — I10 Essential (primary) hypertension: Secondary | ICD-10-CM | POA: Diagnosis not present

## 2017-05-16 DIAGNOSIS — Z7982 Long term (current) use of aspirin: Secondary | ICD-10-CM | POA: Insufficient documentation

## 2017-05-16 DIAGNOSIS — R0789 Other chest pain: Secondary | ICD-10-CM | POA: Diagnosis not present

## 2017-05-16 DIAGNOSIS — Z79899 Other long term (current) drug therapy: Secondary | ICD-10-CM | POA: Insufficient documentation

## 2017-05-16 DIAGNOSIS — Z8546 Personal history of malignant neoplasm of prostate: Secondary | ICD-10-CM | POA: Insufficient documentation

## 2017-05-16 DIAGNOSIS — F17210 Nicotine dependence, cigarettes, uncomplicated: Secondary | ICD-10-CM | POA: Diagnosis not present

## 2017-05-16 LAB — HEPATIC FUNCTION PANEL
ALK PHOS: 82 U/L (ref 38–126)
ALT: 25 U/L (ref 17–63)
AST: 23 U/L (ref 15–41)
Albumin: 4.1 g/dL (ref 3.5–5.0)
Bilirubin, Direct: <0.1 mg/dL — ABNORMAL LOW (ref 0.1–0.5)
TOTAL PROTEIN: 7.7 g/dL (ref 6.5–8.1)
Total Bilirubin: 0.4 mg/dL (ref 0.3–1.2)

## 2017-05-16 LAB — BASIC METABOLIC PANEL
ANION GAP: 12 (ref 5–15)
BUN: 16 mg/dL (ref 6–20)
CALCIUM: 9 mg/dL (ref 8.9–10.3)
CO2: 24 mmol/L (ref 22–32)
CREATININE: 1.03 mg/dL (ref 0.61–1.24)
Chloride: 104 mmol/L (ref 101–111)
GFR calc non Af Amer: >60 mL/min (ref >60)
Glucose, Bld: 124 mg/dL — ABNORMAL HIGH (ref 65–99)
Potassium: 3.7 mmol/L (ref 3.5–5.1)
SODIUM: 140 mmol/L (ref 135–145)

## 2017-05-16 LAB — CBC
HCT: 44.8 % (ref 39.0–52.0)
HEMOGLOBIN: 14.8 g/dL (ref 13.0–17.0)
MCH: 28.5 pg (ref 26.0–34.0)
MCHC: 33 g/dL (ref 30.0–36.0)
MCV: 86.2 fL (ref 78.0–100.0)
PLATELETS: 188 10*3/uL (ref 150–400)
RBC: 5.2 MIL/uL (ref 4.22–5.81)
RDW: 15.1 % (ref 11.5–15.5)
WBC: 8 10*3/uL (ref 4.0–10.5)

## 2017-05-16 LAB — I-STAT TROPONIN, ED: TROPONIN I, POC: 0 ng/mL (ref 0.00–0.08)

## 2017-05-16 LAB — LIPASE, BLOOD: Lipase: 31 U/L (ref 11–51)

## 2017-05-16 LAB — TROPONIN I: Troponin I: <0.03 ng/mL (ref <0.03)

## 2017-05-16 MED ORDER — SODIUM CHLORIDE 0.9 % IJ SOLN
INTRAMUSCULAR | Status: AC
  Administered 2017-05-16: 10 mL
  Filled 2017-05-16: qty 50

## 2017-05-16 MED ORDER — PANTOPRAZOLE SODIUM 20 MG PO TBEC: 20.0000 mg | DELAYED_RELEASE_TABLET | Freq: Two times a day (BID) | ORAL | 0 refills | Status: DC

## 2017-05-16 MED ORDER — MORPHINE SULFATE (PF) 4 MG/ML IV SOLN
4.0000 mg | Freq: Once | INTRAVENOUS | Status: AC
  Administered 2017-05-16: 4 mg via INTRAVENOUS
  Filled 2017-05-16: qty 1

## 2017-05-16 MED ORDER — IOPAMIDOL (ISOVUE-300) INJECTION 61%
INTRAVENOUS | Status: AC
  Administered 2017-05-16: 100 mL
  Filled 2017-05-16: qty 100

## 2017-05-16 MED ORDER — SODIUM CHLORIDE 0.9 % IV BOLUS (SEPSIS)
1000.0000 mL | Freq: Once | INTRAVENOUS | Status: AC
  Administered 2017-05-16: 1000 mL via INTRAVENOUS

## 2017-05-16 MED ORDER — ONDANSETRON HCL 4 MG/2ML IJ SOLN
4.0000 mg | Freq: Once | INTRAMUSCULAR | Status: AC
  Administered 2017-05-16: 4 mg via INTRAVENOUS
  Filled 2017-05-16: qty 2

## 2017-05-16 MED ORDER — FAMOTIDINE IN NACL 20-0.9 MG/50ML-% IV SOLN
20.0000 mg | Freq: Once | INTRAVENOUS | Status: AC
  Administered 2017-05-16: 20 mg via INTRAVENOUS
  Filled 2017-05-16: qty 50

## 2017-05-16 MED ORDER — HYDROCODONE-ACETAMINOPHEN 5-325 MG PO TABS: 1.0000 | ORAL_TABLET | ORAL | 0 refills | Status: DC | PRN

## 2017-05-16 NOTE — Discharge Instructions (Signed)
Try not to smoke or to drink.

## 2017-05-16 NOTE — ED Triage Notes (Signed)
Patient c/o generalized chest pain that started this morning when woke up. Denies any n/v. Reports does smoke.

## 2017-05-16 NOTE — ED Provider Notes (Signed)
[facility] DEPT Provider Note   CSN: [id number] Arrival date & time: 05/16/17  1041     History   Chief Complaint Chief Complaint  Patient presents with  . Chest Pain    HPI Zachary Briggs is a 62 y.o. male.  Pt presents to the ED today with CP.  The pt said CP started upon awakening.  He said the pain is across his chest.  It hurts to move and to breathe.  He said he took his meds this morning.  He denies sob, n/v/d.  No f/c.      Past Medical History:  Diagnosis Date  . Aortic atherosclerosis ([place]) 12/29/2015  . Aortic stenosis 12/30/2015   AVA around 1.3-1.4 cm2, trivial MR, normal LA   size, normal IVC.  Marland Kitchen Back pain   . 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 Heart murmur   . Heart valve disease   . Hepatitis C   . Hypertension   . Prediabetes 12/29/2015  . Prostate cancer ([place])   . Tobacco abuse     Patient Active Problem List   Diagnosis Date Noted  . Aortic stenosis 12/30/2015  . Aortic atherosclerosis ([place]) 12/29/2015  . 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 Prediabetes 12/29/2015  . Chest pain 12/28/2015  . Back pain 12/28/2015  . Hypertension   . Tobacco abuse     Past Surgical History:  Procedure Laterality Date  . BACK SURGERY         Home Medications    Prior to Admission medications   Medication Sig Start Date End Date Taking? Authorizing Provider  aspirin EC 81 MG tablet Take 81 mg by mouth daily.   Yes [provider]  atorvastatin (LIPITOR) 10 MG tablet Take 5 mg by mouth daily.   Yes [provider]  cholecalciferol (VITAMIN D) 1000 units tablet Take 1,000 Units by mouth daily.   Yes [provider]  doxazosin (CARDURA) 8 MG tablet Take 8 mg by mouth daily.    Yes [provider]  hydrochlorothiazide (HYDRODIURIL) 25 MG tablet Take 25 mg by mouth daily.   Yes [provider]  methocarbamol (ROBAXIN) 500 MG tablet Take 500 mg by mouth every 6 (six) hours as needed  for muscle spasms.   Yes [provider]  metoCLOPramide (REGLAN) 10 MG tablet Take 1 tablet (10 mg total) by mouth every 6 (six) hours as needed for nausea (or headache). 09/30/16  Yes Francine Graven, DO  metoprolol tartrate (LOPRESSOR) 25 MG tablet Take 12.5 mg by mouth 2 (two) times daily.   Yes [provider]  oxybutynin (DITROPAN) 5 MG tablet Take 5 mg by mouth daily.   Yes [provider]  pregabalin (LYRICA) 150 MG capsule Take 150 mg by mouth 2 (two) times daily.   Yes [provider]  tamsulosin (FLOMAX) 0.4 MG CAPS capsule Take 0.4 mg by mouth at bedtime.   Yes [provider]  valsartan (DIOVAN) 80 MG tablet Take 80 mg by mouth daily.   Yes [provider]  vitamin B-12 (CYANOCOBALAMIN) 1000 MCG tablet Take 1,000 mcg by mouth daily.   Yes [provider]  HYDROcodone-acetaminophen (NORCO/VICODIN) 5-325 MG tablet Take 1 tablet by mouth every 4 (four) hours as needed. 05/16/17   Isla Pence, MD  naproxen (NAPROSYN) 250 MG tablet Take 1 tablet (250 mg total) 2 (two) times daily as needed by mouth for mild pain or moderate pain (take with food). Patient not  taking: Reported on 05/16/2017 01/21/17   Francine Graven, DO  pantoprazole (PROTONIX) 20 MG tablet Take 1 tablet (20 mg total) by mouth 2 (two) times daily. 05/16/17   Isla Pence, MD  traMADol (ULTRAM) 50 MG tablet Take 1 tablet (50 mg total) by mouth every 6 (six) hours as needed. Patient not taking: Reported on 05/16/2017 10/06/14   Montine Circle, PA-C    Family History Family History  Problem Relation Age of Onset  . Cancer Mother   . Cancer Father   . Cancer Sister   . Diabetes Mellitus II Brother     Social History Social History   Tobacco Use  . Smoking status: Current Every Day Smoker    Types: Cigars  . Smokeless tobacco: Never Used  Substance Use Topics  . Alcohol use: Yes    Alcohol/week: 2.4 oz    Types: 4 Cans of beer per week  . Drug  use: Yes    Types: Marijuana     Allergies   Patient has no known allergies.   Review of Systems Review of Systems  Cardiovascular: Positive for chest pain.  All other systems reviewed and are negative.    Physical Exam Updated Vital Signs BP (!) 162/89   Pulse (!) 56   Temp (!) 97.5 F (36.4 C) (Oral)   Resp 16   Ht 5\' 6"  (1.676 m)   Wt 71.7 kg (158 lb)   SpO2 94%   BMI 25.50 kg/m   Physical Exam  Constitutional: He is oriented to person, place, and time. He appears well-developed and well-nourished.  HENT:  Head: Normocephalic and atraumatic.  Eyes: EOM are normal. Pupils are equal, round, and reactive to light.  Neck: Normal range of motion. Neck supple.  Cardiovascular: Normal rate, regular rhythm, intact distal pulses and normal pulses.  Pulmonary/Chest: Effort normal and breath sounds normal. He exhibits tenderness.  Abdominal: Soft. Bowel sounds are normal. There is tenderness in the epigastric area.  Musculoskeletal: Normal range of motion.       Right lower leg: Normal.       Left lower leg: Normal.  Neurological: He is alert and oriented to person, place, and time.  Skin: Skin is warm and dry. Capillary refill takes less than 2 seconds.  Psychiatric: He has a normal mood and affect. His behavior is normal.  Nursing note and vitals reviewed.    ED Treatments / Results  Labs (all labs ordered are listed, but only abnormal results are displayed) Labs Reviewed  BASIC METABOLIC PANEL - Abnormal; Notable for the following components:      Result Value   Glucose, Bld 124 (*)    All other components within normal limits  HEPATIC FUNCTION PANEL - Abnormal; Notable for the following components:   Bilirubin, Direct <0.1 (*)    All other components within normal limits  CBC  LIPASE, BLOOD  TROPONIN I  I-STAT TROPONIN, ED    EKG  EKG Interpretation  Date/Time:  Saturday May 16 2017 10:48:00 EST Ventricular Rate:  56 PR Interval:    QRS  Duration: 94 QT Interval:  411 QTC Calculation: 397 R Axis:   -18 Text Interpretation:  Sinus rhythm Probable left atrial enlargement Probable left ventricular hypertrophy Borderline T abnormalities, inferior leads No significant change since last tracing Confirmed by Isla Pence [phone]) on 05/16/2017 10:56:42 AM       Radiology Dg Chest 2 View  Result Date: 05/16/2017 CLINICAL DATA:  Chest pain. EXAM: CHEST - 2 VIEW  COMPARISON:  January 21, 2017 FINDINGS: Cardiomegaly. The hila and mediastinum are normal. No pneumothorax. Atelectasis in the left lung base. No overt edema. No suspicious nodules or masses. No other acute abnormalities. IMPRESSION: Cardiomegaly.  Atelectasis in the left base. Electronically Signed   By: Dorise Bullion III M.D   On: 05/16/2017 11:32   Ct Abdomen Pelvis W Contrast  Result Date: 05/16/2017 CLINICAL DATA:  Abdominal pain, acute, generalized EXAM: CT ABDOMEN AND PELVIS WITH CONTRAST TECHNIQUE: Multidetector CT imaging of the abdomen and pelvis was performed using the standard protocol following bolus administration of intravenous contrast. CONTRAST:  153mL ISOVUE-300 IOPAMIDOL (ISOVUE-300) INJECTION 61% COMPARISON:  CT abdomen dated 01/21/2017 FINDINGS: Lower chest: Small consolidation at the right lung base, most likely dependent atelectasis. Hepatobiliary: Liver is low in density suggesting fatty infiltration, with focal fatty sparing near the gallbladder fossa. No suspicious mass or lesion is seen within the liver. Gallbladder is unremarkable. No bile duct dilatation. Pancreas: Unremarkable. No pancreatic ductal dilatation or surrounding inflammatory changes. Spleen: Normal in size without focal abnormality. Adrenals/Urinary Tract: Adrenal glands appear normal. Kidneys appear normal without mass, stone or hydronephrosis. No ureteral or bladder calculi identified. Bladder is unremarkable, partially decompressed. Stomach/Bowel: No dilated large or small bowel. Extensive  diverticulosis of the descending and sigmoid colon but no focal inflammatory change to suggest acute diverticulitis. No bowel wall thickening or evidence of bowel wall inflammation seen. Stomach appears normal. Appendix is normal. Vascular/Lymphatic: Aortic atherosclerosis. No enlarged abdominal or pelvic lymph nodes. Reproductive: Prostate is unremarkable. Other: No free fluid or abscess collection. No free intraperitoneal air. Musculoskeletal: Mild degenerative spurring within the lumbar spine. No acute or suspicious osseous finding. IMPRESSION: 1. No acute findings within the abdomen or pelvis. No source for acute abdominal pain identified. No bowel obstruction or evidence of bowel wall inflammation. No free fluid. No free intraperitoneal air. No evidence of acute solid organ abnormality. No renal or ureteral calculi. Appendix is normal. 2. Fatty infiltration of the liver. 3. Extensive colonic diverticulosis without evidence of acute diverticulitis. 4. Aortic atherosclerosis. Electronically Signed   By: Franki Cabot M.D.   On: 05/16/2017 14:03    Procedures Procedures (including critical care time)  Medications Ordered in ED Medications  sodium chloride 0.9 % bolus 1,000 mL (0 mLs Intravenous Stopped 05/16/17 1246)  ondansetron (ZOFRAN) injection 4 mg (4 mg Intravenous Given 05/16/17 1129)  morphine 4 MG/ML injection 4 mg (4 mg Intravenous Given 05/16/17 1129)  famotidine (PEPCID) IVPB 20 mg premix (0 mg Intravenous Stopped 05/16/17 1425)  morphine 4 MG/ML injection 4 mg (4 mg Intravenous Given 05/16/17 1348)  iopamidol (ISOVUE-300) 61 % injection (100 mLs  Contrast Given 05/16/17 1331)  sodium chloride 0.9 % injection (10 mLs  Given by Other 05/16/17 1353)     Initial Impression / Assessment and Plan / ED Course  I have reviewed the triage vital signs and the nursing notes.  Pertinent labs & imaging results that were available during my care of the patient were reviewed by me and considered in my medical  decision making (see chart for details).   Pt's pain epigastric and tender to palpation.  CT negative.  Pain has improved after treatment.   Pt does drink alcohol and smokes, and I suspect gastritis.  Pt is instructed to f/u with GI for and EGD and to return if worse.   Final Clinical Impressions(s) / ED Diagnoses   Final diagnoses:  Epigastric abdominal pain  Atypical chest pain    ED  Discharge Orders        Ordered    pantoprazole (PROTONIX) 20 MG tablet  2 times daily     05/16/17 1449    HYDROcodone-acetaminophen (NORCO/VICODIN) 5-325 MG tablet  Every 4 hours PRN     05/16/17 1449       Isla Pence, MD 05/16/17 1449

## 2018-01-23 ENCOUNTER — Other Ambulatory Visit: Payer: Self-pay

## 2018-01-23 ENCOUNTER — Emergency Department (HOSPITAL_COMMUNITY)
Admission: EM | Admit: 2018-01-23 | Discharge: 2018-01-23 | Disposition: A | Discharge status: Home / Self Care | Payer: Medicaid Other | Attending: Emergency Medicine | Admitting: Emergency Medicine

## 2018-01-23 ENCOUNTER — Emergency Department (HOSPITAL_COMMUNITY): Payer: Medicaid Other

## 2018-01-23 ENCOUNTER — Encounter (HOSPITAL_COMMUNITY): Payer: Self-pay | Admitting: Emergency Medicine

## 2018-01-23 DIAGNOSIS — Z7982 Long term (current) use of aspirin: Secondary | ICD-10-CM | POA: Diagnosis not present

## 2018-01-23 DIAGNOSIS — F17290 Nicotine dependence, other tobacco product, uncomplicated: Secondary | ICD-10-CM | POA: Diagnosis not present

## 2018-01-23 DIAGNOSIS — N400 Enlarged prostate without lower urinary tract symptoms: Secondary | ICD-10-CM | POA: Insufficient documentation

## 2018-01-23 DIAGNOSIS — Z79899 Other long term (current) drug therapy: Secondary | ICD-10-CM | POA: Diagnosis not present

## 2018-01-23 DIAGNOSIS — R339 Retention of urine, unspecified: Secondary | ICD-10-CM | POA: Diagnosis present

## 2018-01-23 DIAGNOSIS — I10 Essential (primary) hypertension: Secondary | ICD-10-CM | POA: Insufficient documentation

## 2018-01-23 DIAGNOSIS — N390 Urinary tract infection, site not specified: Secondary | ICD-10-CM | POA: Diagnosis not present

## 2018-01-23 LAB — URINALYSIS, ROUTINE W REFLEX MICROSCOPIC
BACTERIA UA: NONE SEEN
Bilirubin Urine: NEGATIVE
Glucose, UA: NEGATIVE mg/dL
KETONES UR: NEGATIVE mg/dL
Nitrite: NEGATIVE
PROTEIN: 30 mg/dL — AB
Specific Gravity, Urine: 1.027 (ref 1.005–1.030)
pH: 6 (ref 5.0–8.0)

## 2018-01-23 MED ORDER — TAMSULOSIN HCL 0.4 MG PO CAPS
0.4000 mg | ORAL_CAPSULE | Freq: Once | ORAL | Status: AC
  Administered 2018-01-23: 0.4 mg via ORAL
  Filled 2018-01-23: qty 1

## 2018-01-23 MED ORDER — CIPROFLOXACIN HCL 500 MG PO TABS: 500.0000 mg | ORAL_TABLET | Freq: Two times a day (BID) | ORAL | 0 refills | Status: DC

## 2018-01-23 MED ORDER — CIPROFLOXACIN HCL 250 MG PO TABS
500.0000 mg | ORAL_TABLET | Freq: Once | ORAL | Status: AC
  Administered 2018-01-23: 500 mg via ORAL
  Filled 2018-01-23: qty 2

## 2018-01-23 NOTE — ED Triage Notes (Signed)
Pt states he has been unable to urinate fully for several weeks.  Went to [place] ER about a month ago for same and has appt with urologist in December.

## 2018-01-23 NOTE — ED Provider Notes (Signed)
[facility] EMERGENCY DEPARTMENT Provider Note   CSN: [id number] Arrival date & time: 01/23/18  0745     History   Chief Complaint Chief Complaint  Patient presents with  . Urinary Retention    HPI Zachary Briggs is a 62 y.o. male.  HPI   Patient presents for evaluation of difficulty urinating, associated with attempted to strain.  He has been having to sit down to urinate for many months.  He has seen a urologist who plans on "putting a light up there."  This is apparently a cystoscopy, planned at the [facility] hospital.  He denies fever, chills, nausea, vomiting, urinary frequency, dysuria or hematuria.  He denies constipation, headache or chest pain.  He is taking his usual prescribed medications.  There are no other known modifying factors.  Past Medical History:  Diagnosis Date  . Aortic atherosclerosis ([place]) 12/29/2015  . Aortic stenosis 12/30/2015   AVA around 1.3-1.4 cm2, trivial MR, normal LA   size, normal IVC.  Marland Kitchen Back pain   . Bulla of lung ([place]) 12/29/2015  . Heart murmur   . Heart valve disease   . Hepatitis C   . Hypertension   . Prediabetes 12/29/2015  . Prostate cancer ([place])   . Tobacco abuse     Patient Active Problem List   Diagnosis Date Noted  . Aortic stenosis 12/30/2015  . Aortic atherosclerosis ([place]) 12/29/2015  . 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 Prediabetes 12/29/2015  . Chest pain 12/28/2015  . Back pain 12/28/2015  . Hypertension   . Tobacco abuse     Past Surgical History:  Procedure Laterality Date  . BACK SURGERY          Home Medications    Prior to Admission medications   Medication Sig Start Date End Date Taking? Authorizing Provider  aspirin EC 81 MG tablet Take 81 mg by mouth daily.   Yes [provider]  atorvastatin (LIPITOR) 10 MG tablet Take 10 mg by mouth daily.    Yes [provider]  cholecalciferol (VITAMIN D) 1000 units tablet Take 1,000 Units by mouth daily.   Yes [provider]    doxazosin (CARDURA) 8 MG tablet Take 8 mg by mouth daily.    Yes [provider]  hydrochlorothiazide (HYDRODIURIL) 25 MG tablet Take 25 mg by mouth daily.   Yes [provider]  methocarbamol (ROBAXIN) 500 MG tablet Take 500 mg by mouth every 6 (six) hours as needed for muscle spasms.   Yes [provider]  metoprolol tartrate (LOPRESSOR) 25 MG tablet Take 12.5 mg by mouth 2 (two) times daily.   Yes [provider]  oxybutynin (DITROPAN) 5 MG tablet Take 5 mg by mouth daily.   Yes [provider]  pantoprazole (PROTONIX) 20 MG tablet Take 1 tablet (20 mg total) by mouth 2 (two) times daily. 05/16/17  Yes Isla Pence, MD  pregabalin (LYRICA) 150 MG capsule Take 150 mg by mouth 2 (two) times daily.   Yes [provider]  tamsulosin (FLOMAX) 0.4 MG CAPS capsule Take 0.4 mg by mouth at bedtime.   Yes [provider]  valsartan (DIOVAN) 80 MG tablet Take 80 mg by mouth daily.   Yes [provider]  vitamin B-12 (CYANOCOBALAMIN) 1000 MCG tablet Take 1,000 mcg by mouth daily.   Yes [provider]  ciprofloxacin (CIPRO) 500 MG tablet Take 1 tablet (500 mg total) by mouth 2 (two) times daily. One po bid x 7  days 01/23/18   Daleen Bo, MD    Family History Family History  Problem Relation Age of Onset  . Cancer Mother   . Cancer Father   . Cancer Sister   . Diabetes Mellitus II Brother     Social History Social History   Tobacco Use  . Smoking status: Current Every Day Smoker    Types: Cigars  . Smokeless tobacco: Never Used  Substance Use Topics  . Alcohol use: Yes    Alcohol/week: 4.0 standard drinks    Types: 4 Cans of beer per week  . Drug use: Yes    Types: Marijuana     Allergies   Patient has no known allergies.   Review of Systems Review of Systems  All other systems reviewed and are negative.    Physical Exam Updated Vital Signs BP (!) 168/92 (BP Location: Right Arm)   Pulse 60    Temp 97.6 F (36.4 C) (Oral)   Resp 16   Ht 5\' 6"  (1.676 m)   Wt 76.7 kg   SpO2 98%   BMI 27.28 kg/m   Physical Exam  Constitutional: He is oriented to person, place, and time. He appears well-developed and well-nourished. No distress.  HENT:  Head: Normocephalic and atraumatic.  Right Ear: External ear normal.  Left Ear: External ear normal.  Eyes: Pupils are equal, round, and reactive to light. Conjunctivae and EOM are normal.  Neck: Normal range of motion and phonation normal. Neck supple.  Cardiovascular: Normal rate, regular rhythm and normal heart sounds.  Pulmonary/Chest: Effort normal and breath sounds normal. He exhibits no bony tenderness.  Abdominal: Soft. He exhibits no mass. There is no tenderness. There is no guarding.  Musculoskeletal: Normal range of motion.  Neurological: He is alert and oriented to person, place, and time. No cranial nerve deficit or sensory deficit. He exhibits normal muscle tone. Coordination normal.  Skin: Skin is warm, dry and intact.  Psychiatric: He has a normal mood and affect. His behavior is normal. Judgment and thought content normal.  Nursing note and vitals reviewed.    ED Treatments / Results  Labs (all labs ordered are listed, but only abnormal results are displayed) Labs Reviewed  URINALYSIS, ROUTINE W REFLEX MICROSCOPIC - Abnormal; Notable for the following components:      Result Value   APPearance CLOUDY (*)    Hgb urine dipstick SMALL (*)    Protein, ur 30 (*)    Leukocytes, UA LARGE (*)    WBC, UA >50 (*)    All other components within normal limits    EKG None  Radiology Dg Abdomen 1 View  Result Date: 01/23/2018 CLINICAL DATA:  Urinary retention. EXAM: ABDOMEN - 1 VIEW COMPARISON:  CT abdomen pelvis dated May 16, 2017. FINDINGS: The bowel gas pattern is normal. No radio-opaque calculi or other significant radiographic abnormality are seen. No acute osseous abnormality. IMPRESSION: Negative. Electronically  Signed   By: Titus Dubin M.D.   On: 01/23/2018 08:55    Procedures Procedures (including critical care time)  Medications Ordered in ED Medications  tamsulosin (FLOMAX) capsule 0.4 mg (0.4 mg Oral Given 01/23/18 0916)  ciprofloxacin (CIPRO) tablet 500 mg (500 mg Oral Given 01/23/18 0916)     Initial Impression / Assessment and Plan / ED Course  I have reviewed the triage vital signs and the nursing notes.  Pertinent labs & imaging results that were available during my care of the patient were reviewed by me and considered in  my medical decision making (see chart for details).  Clinical Course as of Jan 23 1053  Sat Jan 23, 2018  1040 Abnormal, presence of blood, protein, leukocytes, RBCs and WBCs.  Urinalysis, Routine w reflex microscopic(!) [EW]  1050 DG Abdomen 1 View [EW]  1052 Normal bowel gas pattern.  Prostate enlarged on imaging.  The image was reviewed by me.  DG Abdomen 1 View [EW]    Clinical Course User Index [EW] Daleen Bo, MD    Nursing Notes Reviewed/ Care Coordinated Applicable Imaging Reviewed Interpretation of Laboratory Data incorporated into ED treatment  The patient appears reasonably screened and/or stabilized for discharge and I doubt any other medical condition or other [facility] requiring further screening, evaluation, or treatment in the ED at this time prior to discharge.  Plan: Home Medications-continue usual medications; Home Treatments-continue sitting position for urination; return here if the recommended treatment, does not improve the symptoms; Recommended follow up-PCP and urology follow-up at the [facility] as soon as possible   Final Clinical Impressions(s) / ED Diagnoses   Final diagnoses:  Prostate hypertrophy  Urinary tract infection without hematuria, site unspecified    ED Discharge Orders         Ordered    ciprofloxacin (CIPRO) 500 MG tablet  2 times daily     01/23/18 1046           Daleen Bo, MD 01/23/18 1054

## 2018-01-23 NOTE — ED Notes (Signed)
Pt provided urine specimen and transported to lab

## 2018-01-23 NOTE — ED Notes (Signed)
Insufficient amount of urine specimen per lab

## 2018-01-23 NOTE — Discharge Instructions (Signed)
Your prostate is enlarged and likely causing you to not urinate well.  We are prescribing an antibiotic to treat a urinary tract infection which may be in the prostate.  Continue taking all of your medications including her tamsulosin which is for treatment of prostate swelling.  Make sure that you follow-up with your primary care doctor, and also see a urologist for other testing, which you already have planned.

## 2018-01-25 ENCOUNTER — Other Ambulatory Visit: Payer: Self-pay

## 2018-01-25 ENCOUNTER — Emergency Department (HOSPITAL_COMMUNITY)
Admission: EM | Admit: 2018-01-25 | Discharge: 2018-01-25 | Disposition: A | Discharge status: Home / Self Care | Payer: Medicaid Other | Attending: Emergency Medicine | Admitting: Emergency Medicine

## 2018-01-25 ENCOUNTER — Encounter (HOSPITAL_COMMUNITY): Payer: Self-pay | Admitting: Emergency Medicine

## 2018-01-25 DIAGNOSIS — R300 Dysuria: Secondary | ICD-10-CM | POA: Insufficient documentation

## 2018-01-25 DIAGNOSIS — Z5321 Procedure and treatment not carried out due to patient leaving prior to being seen by health care provider: Secondary | ICD-10-CM | POA: Insufficient documentation

## 2018-01-25 LAB — URINALYSIS, ROUTINE W REFLEX MICROSCOPIC
BILIRUBIN URINE: NEGATIVE
Bacteria, UA: NONE SEEN
Glucose, UA: NEGATIVE mg/dL
Ketones, ur: NEGATIVE mg/dL
Nitrite: NEGATIVE
PROTEIN: 30 mg/dL — AB
SPECIFIC GRAVITY, URINE: 1.021 (ref 1.005–1.030)
WBC, UA: >50 WBC/hpf — ABNORMAL HIGH (ref 0–5)
pH: 6 (ref 5.0–8.0)

## 2018-01-25 NOTE — ED Notes (Signed)
Patient vital signs updated triage

## 2018-01-25 NOTE — ED Notes (Signed)
Patient still not in waiting room.

## 2018-01-25 NOTE — ED Triage Notes (Signed)
Patient complaining of difficulty urinating "for over a month." States he was treated here recently for same.

## 2018-01-25 NOTE — ED Notes (Signed)
Called patient to place in room. No answer. Per registration and officer out front, patient left.

## 2018-01-30 ENCOUNTER — Encounter (HOSPITAL_COMMUNITY): Payer: Self-pay | Admitting: Emergency Medicine

## 2018-01-30 ENCOUNTER — Emergency Department (HOSPITAL_COMMUNITY)
Admission: EM | Admit: 2018-01-30 | Discharge: 2018-01-30 | Disposition: A | Discharge status: Home / Self Care | Payer: Medicaid Other | Attending: Emergency Medicine | Admitting: Emergency Medicine

## 2018-01-30 ENCOUNTER — Other Ambulatory Visit: Payer: Self-pay

## 2018-01-30 ENCOUNTER — Encounter (HOSPITAL_COMMUNITY): Payer: Self-pay

## 2018-01-30 ENCOUNTER — Emergency Department (HOSPITAL_COMMUNITY)
Admission: EM | Admit: 2018-01-30 | Discharge: 2018-01-31 | Disposition: A | Discharge status: Home / Self Care | Payer: Medicaid Other | Source: Home / Self Care | Attending: Emergency Medicine | Admitting: Emergency Medicine

## 2018-01-30 DIAGNOSIS — F17290 Nicotine dependence, other tobacco product, uncomplicated: Secondary | ICD-10-CM | POA: Insufficient documentation

## 2018-01-30 DIAGNOSIS — R338 Other retention of urine: Principal | ICD-10-CM

## 2018-01-30 DIAGNOSIS — I10 Essential (primary) hypertension: Secondary | ICD-10-CM

## 2018-01-30 DIAGNOSIS — R339 Retention of urine, unspecified: Secondary | ICD-10-CM

## 2018-01-30 DIAGNOSIS — Z7982 Long term (current) use of aspirin: Secondary | ICD-10-CM

## 2018-01-30 DIAGNOSIS — N401 Enlarged prostate with lower urinary tract symptoms: Secondary | ICD-10-CM

## 2018-01-30 DIAGNOSIS — R1030 Lower abdominal pain, unspecified: Secondary | ICD-10-CM | POA: Diagnosis present

## 2018-01-30 DIAGNOSIS — Z79899 Other long term (current) drug therapy: Secondary | ICD-10-CM | POA: Insufficient documentation

## 2018-01-30 DIAGNOSIS — C61 Malignant neoplasm of prostate: Secondary | ICD-10-CM | POA: Diagnosis not present

## 2018-01-30 DIAGNOSIS — I2510 Atherosclerotic heart disease of native coronary artery without angina pectoris: Secondary | ICD-10-CM

## 2018-01-30 LAB — I-STAT CHEM 8, ED
BUN: 11 mg/dL (ref 8–23)
CALCIUM ION: 1.07 mmol/L — AB (ref 1.15–1.40)
Chloride: 105 mmol/L (ref 98–111)
Creatinine, Ser: 0.7 mg/dL (ref 0.61–1.24)
Glucose, Bld: 110 mg/dL — ABNORMAL HIGH (ref 70–99)
HCT: 39 % (ref 39.0–52.0)
Hemoglobin: 13.3 g/dL (ref 13.0–17.0)
Potassium: 2.9 mmol/L — ABNORMAL LOW (ref 3.5–5.1)
SODIUM: 139 mmol/L (ref 135–145)
TCO2: 23 mmol/L (ref 22–32)

## 2018-01-30 MED ORDER — OXYBUTYNIN CHLORIDE 5 MG PO TABS: 5.0000 mg | ORAL_TABLET | Freq: Three times a day (TID) | ORAL | 0 refills | Status: AC

## 2018-01-30 MED ORDER — KETAMINE HCL 50 MG/ML IJ SOLN
0.5000 mg/kg | Freq: Once | INTRAMUSCULAR | Status: AC
  Administered 2018-01-30: 38.5 mg via INTRAMUSCULAR
  Filled 2018-01-30: qty 10

## 2018-01-30 MED ORDER — KETOROLAC TROMETHAMINE 30 MG/ML IJ SOLN
30.0000 mg | Freq: Once | INTRAMUSCULAR | Status: AC
  Administered 2018-01-30: 30 mg via INTRAMUSCULAR
  Filled 2018-01-30: qty 1

## 2018-01-30 MED ORDER — LIDOCAINE HCL URETHRAL/MUCOSAL 2 % EX GEL
1.0000 "application " | Freq: Once | CUTANEOUS | Status: AC
  Administered 2018-01-30: 1 via TOPICAL
  Filled 2018-01-30: qty 20

## 2018-01-30 MED ORDER — HYDROCODONE-ACETAMINOPHEN 5-325 MG PO TABS
1.0000 | ORAL_TABLET | Freq: Once | ORAL | Status: AC
  Administered 2018-01-30: 1 via ORAL
  Filled 2018-01-30: qty 1

## 2018-01-30 NOTE — ED Provider Notes (Addendum)
[facility] EMERGENCY DEPARTMENT Provider Note   CSN: [id number] Arrival date & time: 01/30/18  1803     History   Chief Complaint No chief complaint on file.   HPI Zachary Briggs is a 62 y.o. male.  Patient is a 62 y.o. Male who presents with recurrent inability to urinate. Patient was discharged from the [place] this morning after was admitted for acute urinary retention requiring foley. Patient states that prior to discharge, he had been able to urinate on his own. On the drive home, patient had retention and could not urinate. When to [facility] and had foley placed but it was removed because patient was complaining of a significant amount of pain. Patient states that he was able to urinate before leaving the ED. Acute retention occurred again and patient arrived at the [facility] ED. Patient states that he is having penile pain and asking for urine to be removed some other way than a catheter.   The history is provided by the patient. No language interpreter was used.    Past Medical History:  Diagnosis Date  . Aortic atherosclerosis ([place]) 12/29/2015  . Aortic stenosis 12/30/2015   AVA around 1.3-1.4 cm2, trivial MR, normal LA   size, normal IVC.  Marland Kitchen Back pain   . 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 Heart murmur   . Heart valve disease   . Hepatitis C   . Hypertension   . Prediabetes 12/29/2015  . Prostate cancer ([place])   . Tobacco abuse     Patient Active Problem List   Diagnosis Date Noted  . Aortic stenosis 12/30/2015  . Aortic atherosclerosis ([place]) 12/29/2015  . 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 Prediabetes 12/29/2015  . Chest pain 12/28/2015  . Back pain 12/28/2015  . Hypertension   . Tobacco abuse     Past Surgical History:  Procedure Laterality Date  . BACK SURGERY          Home Medications    Prior to Admission medications   Medication Sig Start Date End Date Taking? Authorizing Provider  aspirin EC 81 MG tablet Take 81 mg by  mouth daily.    [provider]  atorvastatin (LIPITOR) 10 MG tablet Take 10 mg by mouth daily.     [provider]  cholecalciferol (VITAMIN D) 1000 units tablet Take 1,000 Units by mouth daily.    [provider]  ciprofloxacin (CIPRO) 500 MG tablet Take 1 tablet (500 mg total) by mouth 2 (two) times daily. One po bid x 7 days 01/23/18   Daleen Bo, MD  doxazosin (CARDURA) 8 MG tablet Take 8 mg by mouth daily.     [provider]  hydrochlorothiazide (HYDRODIURIL) 25 MG tablet Take 25 mg by mouth daily.    [provider]  methocarbamol (ROBAXIN) 500 MG tablet Take 500 mg by mouth every 6 (six) hours as needed for muscle spasms.    [provider]  metoprolol tartrate (LOPRESSOR) 25 MG tablet Take 12.5 mg by mouth 2 (two) times daily.    [provider]  oxybutynin (DITROPAN) 5 MG tablet Take 5 mg by mouth daily.    [provider]  pantoprazole (PROTONIX) 20 MG tablet Take 1 tablet (20 mg total) by mouth 2 (two) times daily. 05/16/17   Isla Pence, MD  pregabalin (LYRICA) 150 MG capsule Take 150 mg by mouth 2 (two) times daily.    [provider]  tamsulosin (FLOMAX) 0.4 MG  CAPS capsule Take 0.4 mg by mouth at bedtime.    [provider]  valsartan (DIOVAN) 80 MG tablet Take 80 mg by mouth daily.    [provider]  vitamin B-12 (CYANOCOBALAMIN) 1000 MCG tablet Take 1,000 mcg by mouth daily.    [provider]    Family History Family History  Problem Relation Age of Onset  . Cancer Mother   . Cancer Father   . Cancer Sister   . Diabetes Mellitus II Brother     Social History Social History   Tobacco Use  . Smoking status: Current Every Day Smoker    Types: Cigars  . Smokeless tobacco: Never Used  Substance Use Topics  . Alcohol use: Yes    Alcohol/week: 4.0 standard drinks    Types: 4 Cans of beer per week  . Drug use: Yes    Types: Marijuana     Allergies     Patient has no known allergies.   Review of Systems Review of Systems  Constitutional: Negative for chills and fever.  HENT: Negative for ear pain and sore throat.   Eyes: Negative for pain and visual disturbance.  Respiratory: Negative for cough and shortness of breath.   Cardiovascular: Negative for chest pain and palpitations.  Gastrointestinal: Negative for abdominal pain and vomiting.  Genitourinary: Positive for decreased urine volume, difficulty urinating, enuresis and penile pain. Negative for hematuria, penile swelling, scrotal swelling and testicular pain.  Musculoskeletal: Negative for arthralgias and back pain.  Skin: Negative for color change and rash.  Neurological: Negative for seizures and syncope.  All other systems reviewed and are negative.    Physical Exam Updated Vital Signs Ht 5\' 6"  (1.676 m)   Wt 76.6 kg   BMI 27.26 kg/m   Physical Exam  Constitutional: He appears well-developed and well-nourished.  HENT:  Head: Normocephalic and atraumatic.  Eyes: Conjunctivae are normal.  Neck: Neck supple.  Cardiovascular: Normal rate and regular rhythm.  No murmur heard. Pulmonary/Chest: Effort normal and breath sounds normal. No respiratory distress.  Abdominal: Soft. He exhibits distension. There is tenderness in the suprapubic area.  Genitourinary: Testes normal and penis normal. Uncircumcised. No phimosis, penile erythema or penile tenderness. No discharge found.  Musculoskeletal: He exhibits no edema.  Neurological: He is alert.  Skin: Skin is warm and dry.  Psychiatric: He has a normal mood and affect.  Nursing note and vitals reviewed.    ED Treatments / Results  Labs (all labs ordered are listed, but only abnormal results are displayed) Labs Reviewed  I-STAT CHEM 8, ED - Abnormal; Notable for the following components:      Result Value   Potassium 2.9 (*)    Glucose, Bld 110 (*)    Calcium, Ion 1.07 (*)    All other components within normal  limits  I-STAT CREATININE, ED    EKG None  Radiology No results found.  Procedures Procedures (including critical care time)  Medications Ordered in ED Medications  lidocaine (XYLOCAINE) 2 % jelly 1 application (has no administration in time range)     Initial Impression / Assessment and Plan / ED Course  I have reviewed the triage vital signs and the nursing notes.  Pertinent labs & imaging results that were available during my care of the patient were reviewed by me and considered in my medical decision making (see chart for details).     Patient is a 62 y.o. male with PMHx of BPH who presents for recurrent acute  urinary retention. Bladder scan shows > 500cc of bladder. PE shows suprapubic pain and distention, GU exam is normal.  Basic labs showed normal creatinine, hypokalemia.  Urojet was ordered and foley catheter was unsuccessful. Catheter nursing team was paged. ED nursing team was successful placing foley catheter. Patient will be discharged with the foley catheter in place with strict instructions to follow up with his urologist and keep the foley in place.  Continue PO antibiotics.  Patient given strict return precautions.   Final Clinical Impressions(s) / ED Diagnoses   Final diagnoses:  Urinary retention due to benign prostatic hyperplasia    ED Discharge Orders    None       Erskine Squibb, MD 01/30/18 2204    Erskine Squibb, MD 01/30/18 2226    Varney Biles, MD 01/31/18 2308

## 2018-01-30 NOTE — Discharge Instructions (Addendum)
Follow-up with your doctors in [place] over the weekend if any problems.  Call your doctor Monday to get seen this week

## 2018-01-30 NOTE — ED Provider Notes (Signed)
[facility] EMERGENCY DEPARTMENT Provider Note   CSN: [id number] Arrival date & time: 01/30/18  1541     History   Chief Complaint Chief Complaint  Patient presents with  . Dysuria    HPI Zachary Briggs is a 62 y.o. male.  Patient was discharged from the [facility] today for and he was diagnosed with prostate cancer.  He had his Foley removed prior to discharge and has not been able to urinate.  The history is provided by the patient. No language interpreter was used.  Abdominal Pain   This is a new problem. The current episode started 1 to 2 hours ago. The problem occurs constantly. The problem has not changed since onset.The pain is associated with an unknown factor. The pain is located in the suprapubic region. The quality of the pain is aching. The pain is at a severity of 5/10. The pain is moderate. Pertinent negatives include anorexia, diarrhea, frequency, hematuria and headaches. Nothing aggravates the symptoms. Nothing relieves the symptoms.    Past Medical History:  Diagnosis Date  . Aortic atherosclerosis ([place]) 12/29/2015  . Aortic stenosis 12/30/2015   AVA around 1.3-1.4 cm2, trivial MR, normal LA   size, normal IVC.  Marland Kitchen Back pain   . Bulla of lung ([place]) 12/29/2015  . Heart murmur   . Heart valve disease   . Hepatitis C   . Hypertension   . Prediabetes 12/29/2015  . Prostate cancer ([place])   . Tobacco abuse     Patient Active Problem List   Diagnosis Date Noted  . Aortic stenosis 12/30/2015  . Aortic atherosclerosis ([place]) 12/29/2015  . Bulla of lung ([place]) 12/29/2015  . Prediabetes 12/29/2015  . Chest pain 12/28/2015  . Back pain 12/28/2015  . Hypertension   . Tobacco abuse     Past Surgical History:  Procedure Laterality Date  . BACK SURGERY          Home Medications    Prior to Admission medications   Medication Sig Start Date End Date Taking? Authorizing Provider  aspirin EC 81 MG tablet Take 81 mg by mouth daily.    [provider]  atorvastatin (LIPITOR) 10 MG tablet Take 10 mg by mouth daily.     [provider]  cholecalciferol (VITAMIN D) 1000 units tablet Take 1,000 Units by mouth daily.    [provider]  ciprofloxacin (CIPRO) 500 MG tablet Take 1 tablet (500 mg total) by mouth 2 (two) times daily. One po bid x 7 days 01/23/18   Daleen Bo, MD  doxazosin (CARDURA) 8 MG tablet Take 8 mg by mouth daily.     [provider]  hydrochlorothiazide (HYDRODIURIL) 25 MG tablet Take 25 mg by mouth daily.    [provider]  methocarbamol (ROBAXIN) 500 MG tablet Take 500 mg by mouth every 6 (six) hours as needed for muscle spasms.    [provider]  metoprolol tartrate (LOPRESSOR) 25 MG tablet Take 12.5 mg by mouth 2 (two) times daily.    [provider]  oxybutynin (DITROPAN) 5 MG tablet Take 5 mg by mouth daily.    [provider]  pantoprazole (PROTONIX) 20 MG tablet Take 1 tablet (20 mg total) by mouth 2 (two) times daily. 05/16/17   Isla Pence, MD  pregabalin (LYRICA) 150 MG capsule Take 150 mg by mouth 2 (two) times daily.    [provider]  tamsulosin (FLOMAX) 0.4 MG CAPS capsule Take 0.4 mg by mouth at  bedtime.    [provider]  valsartan (DIOVAN) 80 MG tablet Take 80 mg by mouth daily.    [provider]  vitamin B-12 (CYANOCOBALAMIN) 1000 MCG tablet Take 1,000 mcg by mouth daily.    [provider]    Family History Family History  Problem Relation Age of Onset  . Cancer Mother   . Cancer Father   . Cancer Sister   . Diabetes Mellitus II Brother     Social History Social History   Tobacco Use  . Smoking status: Current Every Day Smoker    Types: Cigars  . Smokeless tobacco: Never Used  Substance Use Topics  . Alcohol use: Yes    Alcohol/week: 4.0 standard drinks    Types: 4 Cans of beer per week  . Drug use: Yes    Types: Marijuana     Allergies   Patient has no known  allergies.   Review of Systems Review of Systems  Constitutional: Negative for appetite change and fatigue.  HENT: Negative for congestion, ear discharge and sinus pressure.   Eyes: Negative for discharge.  Respiratory: Negative for cough.   Cardiovascular: Negative for chest pain.  Gastrointestinal: Positive for abdominal pain. Negative for anorexia and diarrhea.  Genitourinary: Negative for frequency and hematuria.  Musculoskeletal: Negative for back pain.  Skin: Negative for rash.  Neurological: Negative for seizures and headaches.  Psychiatric/Behavioral: Negative for hallucinations.     Physical Exam Updated Vital Signs BP (!) 168/89 (BP Location: Right Arm)   Pulse 83   Temp 98.1 F (36.7 C) (Temporal)   Resp 18   Ht 5\' 6"  (1.676 m)   Wt 76.6 kg   SpO2 100%   BMI 27.26 kg/m   Physical Exam  Constitutional: He is oriented to person, place, and time. He appears well-developed.  HENT:  Head: Normocephalic.  Eyes: Conjunctivae and EOM are normal. No scleral icterus.  Neck: Neck supple. No thyromegaly present.  Cardiovascular: Normal rate and regular rhythm. Exam reveals no gallop and no friction rub.  No murmur heard. Pulmonary/Chest: No stridor. He has no wheezes. He has no rales. He exhibits no tenderness.  Abdominal: He exhibits no distension. There is tenderness. There is no rebound.  Tender suprapubic area and it feels like patient has a full bladder.  Ultrasound showed 600 cc  Musculoskeletal: Normal range of motion. He exhibits no edema.  Lymphadenopathy:    He has no cervical adenopathy.  Neurological: He is oriented to person, place, and time. He exhibits normal muscle tone. Coordination normal.  Skin: No rash noted. No erythema.  Psychiatric: He has a normal mood and affect. His behavior is normal.     ED Treatments / Results  Labs (all labs ordered are listed, but only abnormal results are displayed) Labs Reviewed - No data to  display  EKG None  Radiology No results found.  Procedures Procedures (including critical care time)  Medications Ordered in ED Medications - No data to display   Initial Impression / Assessment and Plan / ED Course  I have reviewed the triage vital signs and the nursing notes.  Pertinent labs & imaging results that were available during my care of the patient were reviewed by me and considered in my medical decision making (see chart for details).     Patient had a Foley placed and 600 cc were removed.  Patient was having significant pain with the Foley in place.  The Foley balloon was deflated and the patient still  had this comfort.  Foley was advanced further and he still had significant pain in his penis from the Foley.  The Foley was then removed and the patient's pain decreased.  He will go home without a Foley and if he cannot urinate he has decided to go back to the [place] where his urologist is.  If he is able to urinate he will follow-up this week with his urologist.  Patient is on antibiotics already Final Clinical Impressions(s) / ED Diagnoses   Final diagnoses:  Urinary retention    ED Discharge Orders    None       Milton Ferguson, MD 01/30/18 [phone]

## 2018-01-30 NOTE — ED Notes (Signed)
Pt continues to c/o pain in his penis after catheter was inserted. Dr. Roderic Palau in to evaluate pt. Catheter balloon was deflated and pt c/o more pain. Catheter was inserted further per Dr. Ellsworth Lennox order. Pt continues to c/o pain. Pt requesting catheter to be removed. Catheter removed. Dr. Roderic Palau aware.

## 2018-01-30 NOTE — ED Notes (Signed)
Patient verbalizes understanding of discharge instructions. Opportunity for questioning and answers were provided. Armband removed by staff, pt discharged from ED in wheelchair with coude cath and leg bag attached. Son driving patient home due to medication admin.

## 2018-01-30 NOTE — ED Triage Notes (Signed)
Pt was admitted to [facility] and d/c today for urinary retention, states he was urinating well until 2 hours ago. Pt requesting a catheter be placed.

## 2018-01-30 NOTE — ED Triage Notes (Signed)
Pt d/c from [facility] hospital for urinary retention today, was able to urinate on discharge post catheter removal but en route to home unable to void. Seen at [facility] today, catheterized and discharged, and told to come here for urology Arrives here with urinary cup with small brown stone-like spheres in it, states he voided those out.

## 2018-01-30 NOTE — ED Notes (Addendum)
Bladder scan completed. Results: >542mL

## 2018-01-30 NOTE — ED Notes (Addendum)
EDP Fabio Bering) attempted coude cath unsuccessfully with this nurse. EDP consulting catheter team to have placed.

## 2019-04-24 ENCOUNTER — Emergency Department (HOSPITAL_COMMUNITY)
Admission: EM | Admit: 2019-04-24 | Discharge: 2019-04-24 | Disposition: A | Discharge status: Home / Self Care | Payer: Medicaid Other | Attending: Emergency Medicine | Admitting: Emergency Medicine

## 2019-04-24 ENCOUNTER — Emergency Department (HOSPITAL_COMMUNITY): Payer: Medicaid Other

## 2019-04-24 ENCOUNTER — Encounter (HOSPITAL_COMMUNITY): Payer: Self-pay | Admitting: *Deleted

## 2019-04-24 ENCOUNTER — Other Ambulatory Visit: Payer: Self-pay

## 2019-04-24 DIAGNOSIS — F17200 Nicotine dependence, unspecified, uncomplicated: Secondary | ICD-10-CM | POA: Insufficient documentation

## 2019-04-24 DIAGNOSIS — I10 Essential (primary) hypertension: Secondary | ICD-10-CM | POA: Diagnosis not present

## 2019-04-24 DIAGNOSIS — Z7982 Long term (current) use of aspirin: Secondary | ICD-10-CM | POA: Diagnosis not present

## 2019-04-24 DIAGNOSIS — R519 Headache, unspecified: Secondary | ICD-10-CM | POA: Diagnosis not present

## 2019-04-24 DIAGNOSIS — Z79899 Other long term (current) drug therapy: Secondary | ICD-10-CM | POA: Diagnosis not present

## 2019-04-24 MED ORDER — SODIUM CHLORIDE 0.9 % IV BOLUS
1000.0000 mL | Freq: Once | INTRAVENOUS | Status: AC
  Administered 2019-04-24: 09:00:00 1000 mL via INTRAVENOUS

## 2019-04-24 MED ORDER — METOCLOPRAMIDE HCL 5 MG/ML IJ SOLN
10.0000 mg | Freq: Once | INTRAMUSCULAR | Status: AC
  Administered 2019-04-24: 09:00:00 10 mg via INTRAVENOUS
  Filled 2019-04-24: qty 2

## 2019-04-24 MED ORDER — DEXAMETHASONE SODIUM PHOSPHATE 10 MG/ML IJ SOLN
10.0000 mg | Freq: Once | INTRAMUSCULAR | Status: AC
  Administered 2019-04-24: 09:00:00 10 mg via INTRAVENOUS
  Filled 2019-04-24: qty 1

## 2019-04-24 MED ORDER — DIPHENHYDRAMINE HCL 50 MG/ML IJ SOLN
50.0000 mg | Freq: Once | INTRAMUSCULAR | Status: AC
  Administered 2019-04-24: 09:00:00 50 mg via INTRAVENOUS
  Filled 2019-04-24: qty 1

## 2019-04-24 MED ORDER — AMOXICILLIN-POT CLAVULANATE 875-125 MG PO TABS: 1.0000 | ORAL_TABLET | Freq: Two times a day (BID) | ORAL | 0 refills | Status: AC

## 2019-04-24 NOTE — Discharge Instructions (Signed)
Follow-up with your primary care doctor within 1 week to have your blood pressure rechecked.  Your CT scan shows you have sinusitis. An antibiotic was sent to your pharmacy. Please take as prescribed. You can also use OTC flonase  Return to the emergency department for any new or worsening symptoms including worsening headache, dizziness, blurry vision, chest pain, abdominal pain, nausea, vomiting

## 2019-04-24 NOTE — ED Provider Notes (Signed)
[facility] DEPT Provider Note   CSN: [id number] Arrival date & time: 04/24/19  0801     History Chief Complaint  Patient presents with  . Headache    Zachary Briggs is a 64 y.o. male with past medical history significant for hepatitis C, hypertension, aortic stenosis, prediabetes presents to emergency department today with chief complaint of intermittent headache x 4 days. He states the headache has progressively worsened since onset.  It feels like headaches he has had in the past however they do not usually last this long.  He rates the pain 9 out of 10 in severity.  He says it feels like his head is in a vice.  He denies any associated neck pain.  He does admit to photophobia. Patient states he has taken his usual home medications but no additional medications for his headache.  He also endorses multiple falls over the last several weeks.  He is unsure if he has hit his head while falling, denies any LOC. Denies fever, syncope, head trauma, phonophobia, UL throbbing, N/V, visual changes, stiff neck, neck pain, rash, or "thunderclap" onset.  Patient is not anticoagulated.    Past Medical History:  Diagnosis Date  . Aortic atherosclerosis ([place]) 12/29/2015  . Aortic stenosis 12/30/2015   AVA around 1.3-1.4 cm2, trivial MR, normal LA   size, normal IVC.  Marland Kitchen Back pain   . 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 Heart murmur   . Heart valve disease   . Hepatitis C   . Hypertension   . Prediabetes 12/29/2015  . Prostate cancer ([place])   . Tobacco abuse     Patient Active Problem List   Diagnosis Date Noted  . Aortic stenosis 12/30/2015  . Aortic atherosclerosis ([place]) 12/29/2015  . 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 Prediabetes 12/29/2015  . Chest pain 12/28/2015  . Back pain 12/28/2015  . Hypertension   . Tobacco abuse     Past Surgical History:  Procedure Laterality Date  . BACK SURGERY         Family History  Problem Relation Age of Onset   . Cancer Mother   . Cancer Father   . Cancer Sister   . Diabetes Mellitus II Brother     Social History   Tobacco Use  . Smoking status: Current Every Day Smoker    Types: Cigars  . Smokeless tobacco: Never Used  Substance Use Topics  . Alcohol use: Yes    Alcohol/week: 4.0 standard drinks    Types: 4 Cans of beer per week  . Drug use: Yes    Types: Marijuana    Home Medications Prior to Admission medications   Medication Sig Start Date End Date Taking? Authorizing Provider  aspirin EC 81 MG tablet Take 81 mg by mouth daily.    [provider]  atorvastatin (LIPITOR) 10 MG tablet Take 10 mg by mouth daily.     [provider]  cholecalciferol (VITAMIN D) 1000 units tablet Take 1,000 Units by mouth daily.    [provider]  ciprofloxacin (CIPRO) 500 MG tablet Take 1 tablet (500 mg total) by mouth 2 (two) times daily. One po bid x 7 days 01/23/18   Daleen Bo, MD  doxazosin (CARDURA) 8 MG tablet Take 8 mg by mouth daily.     [provider]  hydrochlorothiazide (HYDRODIURIL) 25 MG tablet Take 25 mg by mouth daily.    [provider]  methocarbamol (ROBAXIN) 500 MG tablet  Take 500 mg by mouth every 6 (six) hours as needed for muscle spasms.    [provider]  metoprolol tartrate (LOPRESSOR) 25 MG tablet Take 12.5 mg by mouth 2 (two) times daily.    [provider]  pantoprazole (PROTONIX) 20 MG tablet Take 1 tablet (20 mg total) by mouth 2 (two) times daily. 05/16/17   Isla Pence, MD  pregabalin (LYRICA) 150 MG capsule Take 150 mg by mouth 2 (two) times daily.    [provider]  tamsulosin (FLOMAX) 0.4 MG CAPS capsule Take 0.4 mg by mouth at bedtime.    [provider]  valsartan (DIOVAN) 80 MG tablet Take 80 mg by mouth daily.    [provider]  vitamin B-12 (CYANOCOBALAMIN) 1000 MCG tablet Take 1,000 mcg by mouth daily.    [provider]    Allergies    Patient has no  known allergies.  Review of Systems   Review of Systems All other systems are reviewed and are negative for acute change except as noted in the HPI.  Physical Exam Updated Vital Signs BP (!) 184/102 Comment: did take his BP medication this morning  Pulse 83   Temp 97.8 F (36.6 C) (Oral)   Resp 15   Ht 5\' 6"  (1.676 m)   Wt 77.1 kg   SpO2 94%   BMI 27.44 kg/m   Physical Exam Vitals and nursing note reviewed.  Constitutional:      General: He is not in acute distress.    Appearance: He is not ill-appearing.  HENT:     Head: Normocephalic and atraumatic.     Comments: No temporal tenderness. Maxillary sinus tenderness.    Right Ear: Tympanic membrane and external ear normal.     Left Ear: Tympanic membrane and external ear normal.     Nose: Nose normal.     Mouth/Throat:     Mouth: Mucous membranes are moist.     Pharynx: Oropharynx is clear.  Eyes:     General: No scleral icterus.       Right eye: No discharge.        Left eye: No discharge.     Extraocular Movements: Extraocular movements intact.     Conjunctiva/sclera: Conjunctivae normal.     Pupils: Pupils are equal, round, and reactive to light.  Neck:     Vascular: No JVD.  Cardiovascular:     Rate and Rhythm: Normal rate and regular rhythm.     Pulses: Normal pulses.          Radial pulses are 2+ on the right side and 2+ on the left side.     Heart sounds: Normal heart sounds.  Pulmonary:     Comments: Lungs clear to auscultation in all fields. Symmetric chest rise. No wheezing, rales, or rhonchi. Abdominal:     Comments: Abdomen is soft, non-distended, and non-tender in all quadrants. No rigidity, no guarding. No peritoneal signs.  Musculoskeletal:        General: Normal range of motion.     Cervical back: Normal range of motion.  Skin:    General: Skin is warm and dry.     Capillary Refill: Capillary refill takes less than 2 seconds.  Neurological:     Mental Status: He is oriented to person, place,  and time.     GCS: GCS eye subscore is 4. GCS verbal subscore is 5. GCS motor subscore is 6.     Comments: Mental Status:  Alert,  oriented, thought content appropriate, able to give a coherent history. Speech fluent without evidence of aphasia. Able to follow 2 step commands without difficulty.  Cranial Nerves:  II:  Peripheral visual fields grossly normal, pupils equal, round, reactive to light III,IV, VI: ptosis not present, extra-ocular motions intact bilaterally  V,VII: smile symmetric, facial light touch sensation equal VIII: hearing grossly normal to voice  X: uvula elevates symmetrically  XI: bilateral shoulder shrug symmetric and strong XII: midline tongue extension without fassiculations Motor:  Normal tone. 5/5 in upper and lower extremities bilaterally including strong and equal grip strength and dorsiflexion/plantar flexion Sensory: Pinprick and light touch normal in all extremities.  Deep Tendon Reflexes: 2+ and symmetric in the biceps and patella Cerebellar: normal finger-to-nose with bilateral upper extremities Gait: normal gait and balance CV: distal pulses palpable throughout     Psychiatric:        Behavior: Behavior normal.       ED Results / Procedures / Treatments   Labs (all labs ordered are listed, but only abnormal results are displayed) Labs Reviewed - No data to display  EKG None  Radiology CT Head Wo Contrast  Result Date: 04/24/2019 CLINICAL DATA:  Recent fall with headaches, initial encounter EXAM: CT HEAD WITHOUT CONTRAST TECHNIQUE: Contiguous axial images were obtained from the base of the skull through the vertex without intravenous contrast. COMPARISON:  09/14/2016 FINDINGS: Brain: No evidence of acute infarction, hemorrhage, hydrocephalus, extra-axial collection or mass lesion/mass effect. Vascular: No hyperdense vessel or unexpected calcification. Skull: Normal. Negative for fracture or focal lesion. Sinuses/Orbits: Orbits and their contents  are within normal limits. Diffuse mucosal thickening is noted within the maxillary antra bilaterally. No air-fluid levels are noted. Other: None. IMPRESSION: No acute intracranial abnormality noted. Changes in the paranasal sinuses increased when compared with the prior exam. Electronically Signed   By: Inez Catalina M.D.   On: 04/24/2019 09:42    Procedures Procedures (including critical care time)  Medications Ordered in ED Medications  metoCLOPramide (REGLAN) injection 10 mg (10 mg Intravenous Given 04/24/19 0921)  dexamethasone (DECADRON) injection 10 mg (10 mg Intravenous Given 04/24/19 0921)  diphenhydrAMINE (BENADRYL) injection 50 mg (50 mg Intravenous Given 04/24/19 0922)  sodium chloride 0.9 % bolus 1,000 mL (1,000 mLs Intravenous New Bag/Given 04/24/19 [id number])    ED Course  I have reviewed the triage vital signs and the nursing notes.  Pertinent labs & imaging results that were available during my care of the patient were reviewed by me and considered in my medical decision making (see chart for details). Vitals:   04/24/19 0811 04/24/19 1021 04/24/19 1030 04/24/19 1100  BP:  (!) 202/102 (!) 192/95 (!) 182/102  Pulse:  73 66 69  Resp:  16 18 16   Temp:  97.8 F (36.6 C)    TempSrc:      SpO2:  95% 94% 95%  Weight: 77.1 kg     Height: 5\' 6"  (1.676 m)         MDM Rules/Calculators/A&P                      Pt HA treated with IV reglan, benadryl, decadron and improved while in ED. Also received 1L IVF. Presentation is like pts typical HA and non concerning for [facility], ICH, Meningitis, or temporal arteritis. Pt is afebrile with no focal neuro deficits, nuchal rigidity, or change in vision. Patient did admit to history of multiple falls.  Head CT performed and is negative for  any acute traumatic findings, no bleeding, no stroke. CT does show possible diffuse mucosal thickening in maxillary antra bilaterally. Will treat for sinusitis given CT findings and physical exam findings.   ED  attending Dr. Wilson Singer and myself viewed past vitals and patient consistently has elevated blood pressure readings. He reports mostly compliance with medications, he'll need to follow up closely with PCP to have BP rechecked within 1 week. His headache improved here, doubt hypertensive urgency or emergency given elevated BP is not new and similar to prior.  The patient appears reasonably screened and/or stabilized for discharge and I doubt any other medical condition or other [facility] requiring further screening, evaluation, or treatment in the ED at this time prior to discharge. The patient is safe for discharge with strict return precautions discussed. Pt verbalizes understanding and is agreeable with plan to dc.    Portions of this note were generated with [employer]. Dictation errors may occur despite best attempts at proofreading.   Final Clinical Impression(s) / ED Diagnoses Final diagnoses:  Acute nonintractable headache, unspecified headache type    Rx / DC Orders ED Discharge Orders    None       Flint Melter 04/24/19 1231    Virgel Manifold, MD 04/26/19 1247

## 2019-04-24 NOTE — ED Triage Notes (Signed)
States he has had a headache for 4 days, does not know if anything makes it better or worse

## 2019-04-27 ENCOUNTER — Encounter (HOSPITAL_COMMUNITY): Payer: Self-pay | Admitting: Emergency Medicine

## 2019-04-27 ENCOUNTER — Emergency Department (HOSPITAL_COMMUNITY)
Admission: EM | Admit: 2019-04-27 | Discharge: 2019-04-27 | Disposition: A | Discharge status: Home / Self Care | Payer: Medicaid Other | Attending: Emergency Medicine | Admitting: Emergency Medicine

## 2019-04-27 ENCOUNTER — Other Ambulatory Visit: Payer: Self-pay

## 2019-04-27 DIAGNOSIS — I10 Essential (primary) hypertension: Secondary | ICD-10-CM | POA: Insufficient documentation

## 2019-04-27 DIAGNOSIS — G43909 Migraine, unspecified, not intractable, without status migrainosus: Secondary | ICD-10-CM | POA: Diagnosis not present

## 2019-04-27 DIAGNOSIS — Z20822 Contact with and (suspected) exposure to covid-19: Secondary | ICD-10-CM | POA: Insufficient documentation

## 2019-04-27 DIAGNOSIS — F17290 Nicotine dependence, other tobacco product, uncomplicated: Secondary | ICD-10-CM | POA: Diagnosis not present

## 2019-04-27 DIAGNOSIS — R519 Headache, unspecified: Secondary | ICD-10-CM | POA: Diagnosis present

## 2019-04-27 DIAGNOSIS — Z79899 Other long term (current) drug therapy: Secondary | ICD-10-CM | POA: Insufficient documentation

## 2019-04-27 DIAGNOSIS — G43901 Migraine, unspecified, not intractable, with status migrainosus: Secondary | ICD-10-CM

## 2019-04-27 LAB — CBC
HCT: 47.4 % (ref 39.0–52.0)
Hemoglobin: 15.3 g/dL (ref 13.0–17.0)
MCH: 28 pg (ref 26.0–34.0)
MCHC: 32.3 g/dL (ref 30.0–36.0)
MCV: 86.8 fL (ref 80.0–100.0)
Platelets: 242 10*3/uL (ref 150–400)
RBC: 5.46 MIL/uL (ref 4.22–5.81)
RDW: 17.6 % — ABNORMAL HIGH (ref 11.5–15.5)
WBC: 10 10*3/uL (ref 4.0–10.5)
nRBC: 0 % (ref 0.0–0.2)

## 2019-04-27 LAB — BASIC METABOLIC PANEL
Anion gap: 10 (ref 5–15)
BUN: 13 mg/dL (ref 8–23)
CO2: 23 mmol/L (ref 22–32)
Calcium: 9.5 mg/dL (ref 8.9–10.3)
Chloride: 105 mmol/L (ref 98–111)
Creatinine, Ser: 1.04 mg/dL (ref 0.61–1.24)
GFR calc Af Amer: >60 mL/min (ref >60)
GFR calc non Af Amer: >60 mL/min (ref >60)
Glucose, Bld: 117 mg/dL — ABNORMAL HIGH (ref 70–99)
Potassium: 3.5 mmol/L (ref 3.5–5.1)
Sodium: 138 mmol/L (ref 135–145)

## 2019-04-27 LAB — POC SARS CORONAVIRUS 2 AG -  ED: SARS Coronavirus 2 Ag: NEGATIVE

## 2019-04-27 MED ORDER — AMLODIPINE BESYLATE 5 MG PO TABS
5.0000 mg | ORAL_TABLET | Freq: Once | ORAL | Status: AC
  Administered 2019-04-27: 09:00:00 5 mg via ORAL
  Filled 2019-04-27: qty 1

## 2019-04-27 MED ORDER — HYDROCHLOROTHIAZIDE 25 MG PO TABS: 25.0000 mg | ORAL_TABLET | Freq: Every day | ORAL | 0 refills | Status: DC

## 2019-04-27 MED ORDER — KETOROLAC TROMETHAMINE 30 MG/ML IJ SOLN
15.0000 mg | Freq: Once | INTRAMUSCULAR | Status: AC
  Administered 2019-04-27: 09:00:00 15 mg via INTRAVENOUS
  Filled 2019-04-27: qty 1

## 2019-04-27 MED ORDER — SERTRALINE HCL 100 MG PO TABS: 150.0000 mg | ORAL_TABLET | Freq: Every day | ORAL | 0 refills | Status: DC

## 2019-04-27 MED ORDER — CARVEDILOL 6.25 MG PO TABS: 6.2500 mg | ORAL_TABLET | Freq: Two times a day (BID) | ORAL | 0 refills | Status: DC

## 2019-04-27 MED ORDER — PROCHLORPERAZINE EDISYLATE 10 MG/2ML IJ SOLN
10.0000 mg | Freq: Once | INTRAMUSCULAR | Status: AC
  Administered 2019-04-27: 09:00:00 10 mg via INTRAVENOUS
  Filled 2019-04-27: qty 2

## 2019-04-27 MED ORDER — NORTRIPTYLINE HCL 10 MG PO CAPS: 20.0000 mg | ORAL_CAPSULE | Freq: Every day | ORAL | 0 refills | Status: DC

## 2019-04-27 MED ORDER — DIPHENHYDRAMINE HCL 50 MG/ML IJ SOLN
25.0000 mg | Freq: Once | INTRAMUSCULAR | Status: AC
  Administered 2019-04-27: 09:00:00 25 mg via INTRAVENOUS
  Filled 2019-04-27: qty 1

## 2019-04-27 MED ORDER — DEXAMETHASONE SODIUM PHOSPHATE 4 MG/ML IJ SOLN
4.0000 mg | Freq: Once | INTRAMUSCULAR | Status: AC
  Administered 2019-04-27: 09:00:00 4 mg via INTRAVENOUS
  Filled 2019-04-27: qty 1

## 2019-04-27 NOTE — ED Triage Notes (Signed)
Patient arrived by EMS from home. Patient c/o headache and body aches. Patient seen in ED 2/14 and diagnosed with sinus infection per patient statement.   Patient BP is hypertensive per EMS.   Patient has hx of HTN. Patient unable to obtain medications from [place] per patient statement.

## 2019-04-27 NOTE — ED Provider Notes (Signed)
[facility] DEPT Provider Note   CSN: [id number] Arrival date & time: 04/27/19  [id number]     History Chief Complaint  Patient presents with  . Headache    Zachary Briggs is a 64 y.o. male.  HPI   Pt presents to the ED for persistent headache.  Patient has a history of migraine headaches as well as hypertension.  He has been having trouble with headache for the past week or so.  Patient states initially started gradually and progressed in severity.  He came to the ED 3 days ago for the same symptoms.  Patient states he felt better after his treatment in the emergency room on February 14 but his headache has returned.  Patient denies any trouble with fevers or chills.  No neck pain.  No numbness or weakness the headache is all throughout his head but he also has a sharp stabbing sensation at the top of his head.   Patient has medications for migraines as well as his blood pressure.  He has run out of several of those medications.  He states he is taking what he has left but [facility].  Past Medical History:  Diagnosis Date  . Aortic atherosclerosis ([place]) 12/29/2015  . Aortic stenosis 12/30/2015   AVA around 1.3-1.4 cm2, trivial MR, normal LA   size, normal IVC.  Marland Kitchen Back pain   . 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 Heart murmur   . Heart valve disease   . Hepatitis C   . Hypertension   . Prediabetes 12/29/2015  . Prostate cancer ([place])   . Tobacco abuse     Patient Active Problem List   Diagnosis Date Noted  . Aortic stenosis 12/30/2015  . Aortic atherosclerosis ([place]) 12/29/2015  . 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 Prediabetes 12/29/2015  . Chest pain 12/28/2015  . Back pain 12/28/2015  . Hypertension   . Tobacco abuse     Past Surgical History:  Procedure Laterality Date  . BACK SURGERY         Family History  Problem Relation Age of Onset  . Cancer Mother   . Cancer Father   .  Cancer Sister   . Diabetes Mellitus II Brother     Social History   Tobacco Use  . Smoking status: Current Every Day Smoker    Types: Cigars  . Smokeless tobacco: Never Used  Substance Use Topics  . Alcohol use: Yes    Alcohol/week: 4.0 standard drinks    Types: 4 Cans of beer per week  . Drug use: Yes    Types: Marijuana    Home Medications Prior to Admission medications   Medication Sig Start Date End Date Taking? Authorizing Provider  acetaminophen (TYLENOL) 325 MG tablet Take 650 mg by mouth every 4 (four) hours as needed for mild pain or fever.   Yes [provider]  amoxicillin-clavulanate (AUGMENTIN) 875-125 MG tablet Take 1 tablet by mouth every 12 (twelve) hours for 5 days. 04/24/19 04/29/19 Yes Albrizze, Harley Hallmark, PA-C  aspirin EC 81 MG tablet Take 81 mg by mouth daily.   Yes [provider]  atorvastatin (LIPITOR) 20 MG tablet Take 20 mg by mouth daily.    Yes [provider]  docusate sodium (COLACE) 50 MG capsule Take 50 mg by mouth 2 (two) times daily as needed for mild constipation.   Yes [provider]  tamsulosin (FLOMAX) 0.4 MG CAPS capsule Take 0.4 mg by mouth at bedtime.   Yes [provider]  carvedilol (COREG) 6.25 MG tablet Take 1 tablet (6.25 mg total) by mouth 2 (two) times daily with a meal. 04/27/19   Dorie Rank, MD  ciprofloxacin (CIPRO) 500 MG tablet Take 1 tablet (500 mg total) by mouth 2 (two) times daily. One po bid x 7 days Patient not taking: Reported on 04/27/2019 01/23/18   Daleen Bo, MD  hydrochlorothiazide (HYDRODIURIL) 25 MG tablet Take 1 tablet (25 mg total) by mouth daily. 04/27/19   Dorie Rank, MD  nortriptyline (PAMELOR) 10 MG capsule Take 2 capsules (20 mg total) by mouth at bedtime. 04/27/19   Dorie Rank, MD  pantoprazole (PROTONIX) 20 MG tablet Take 1 tablet (20 mg total) by mouth 2 (two) times daily. Patient not taking: Reported on 04/27/2019 05/16/17   Isla Pence, MD  sertraline (ZOLOFT)  100 MG tablet Take 1.5 tablets (150 mg total) by mouth daily. 04/27/19   Dorie Rank, MD    Allergies    Patient has no known allergies.  Review of Systems   Review of Systems  All other systems reviewed and are negative.   Physical Exam Updated Vital Signs BP (!) 178/96 (BP Location: Right Arm)   Pulse 73   Temp 97.6 F (36.4 C) (Oral)   Resp 17   Ht 1.676 m (5\' 6" )   Wt 77.1 kg   SpO2 94%   BMI 27.44 kg/m   Physical Exam Vitals and nursing note reviewed.  Constitutional:      General: He is not in acute distress.    Appearance: He is well-developed.  HENT:     Head: Normocephalic and atraumatic.     Right Ear: External ear normal.     Left Ear: External ear normal.  Eyes:     General: No scleral icterus.       Right eye: No discharge.        Left eye: No discharge.     Conjunctiva/sclera: Conjunctivae normal.  Neck:     Trachea: No tracheal deviation.  Cardiovascular:     Rate and Rhythm: Normal rate and regular rhythm.  Pulmonary:     Effort: Pulmonary effort is normal. No respiratory distress.     Breath sounds: Normal breath sounds. No stridor. No wheezing or rales.  Abdominal:     General: Bowel sounds are normal. There is no distension.     Palpations: Abdomen is soft.     Tenderness: There is no abdominal tenderness. There is no guarding or rebound.  Musculoskeletal:        General: No tenderness.     Cervical back: Normal range of motion and neck supple. No rigidity.  Skin:    General: Skin is warm and dry.     Findings: No rash.  Neurological:     Mental Status: He is alert.     Cranial Nerves: No cranial nerve deficit (no facial droop, extraocular movements intact, no slurred speech).     Sensory: No sensory deficit.     Motor: No abnormal muscle tone or seizure activity.     Coordination: Coordination normal.     ED Results / Procedures / Treatments   Labs (all labs ordered are listed, but only abnormal results are displayed) Labs Reviewed   BASIC METABOLIC PANEL - Abnormal; Notable for the following components:      Result Value   Glucose, Bld 117 (*)  All other components within normal limits  CBC - Abnormal; Notable for the following components:   RDW 17.6 (*)    All other components within normal limits  POC SARS CORONAVIRUS 2 AG -  ED     Procedures Procedures (including critical care time)  Medications Ordered in ED Medications  amLODipine (NORVASC) tablet 5 mg (5 mg Oral Given 04/27/19 0911)  diphenhydrAMINE (BENADRYL) injection 25 mg (25 mg Intravenous Given 04/27/19 0919)  prochlorperazine (COMPAZINE) injection 10 mg (10 mg Intravenous Given 04/27/19 0918)  ketorolac (TORADOL) 30 MG/ML injection 15 mg (15 mg Intravenous Given 04/27/19 0917)  dexamethasone (DECADRON) injection 4 mg (4 mg Intravenous Given 04/27/19 [id number])    ED Course  I have reviewed the triage vital signs and the nursing notes.  Pertinent labs & imaging results that were available during my care of the patient were reviewed by me and considered in my medical decision making (see chart for details).  Clinical Course as of Apr 26 1033  Wed Apr 27, 2019  1013 Labs reviewed.  No sign abnormality.   Covid negative   [JK]    Clinical Course User Index [JK] Dorie Rank, MD   MDM Rules/Calculators/A&P                      Pt presents with recurrent headache.  Recent CT on 2/14.  Labs today normal.  Covid negative and no known contacts.   ED workup reassuring.  No thunderclap onset.  No meningismus. No signs of acute infection.  No neuro deficits. Pt has history of migraine.  Suspect migraine headache.  HTN may be a contributing factor.  Will refilll his home meds.   At this time there does not appear to be any evidence of an acute emergency medical condition and the patient appears stable for discharge with appropriate outpatient follow up.  Final Clinical Impression(s) / ED Diagnoses Final diagnoses:  Migraine with status migrainosus, not  intractable, unspecified migraine type  Hypertension, unspecified type    Rx / DC Orders ED Discharge Orders         Ordered    carvedilol (COREG) 6.25 MG tablet  2 times daily with meals     04/27/19 1027    hydrochlorothiazide (HYDRODIURIL) 25 MG tablet  Daily     04/27/19 1027    nortriptyline (PAMELOR) 10 MG capsule  Daily at bedtime     04/27/19 1027    sertraline (ZOLOFT) 100 MG tablet  Daily     04/27/19 1027           Dorie Rank, MD 04/27/19 1035

## 2019-04-27 NOTE — Discharge Instructions (Addendum)
I refilled some of your medications we had listed.     Please call your primary care doctor to set up a follow up appointment .   Return to the ED for fever, weakness, numbness, other concerning symptoms

## 2021-07-16 ENCOUNTER — Other Ambulatory Visit: Payer: Self-pay

## 2021-07-16 ENCOUNTER — Inpatient Hospital Stay (HOSPITAL_COMMUNITY)
Admission: EM | Admit: 2021-07-16 | Discharge: 2021-07-31 | DRG: 207 | Disposition: A | Discharge status: Home Health | Payer: Medicare Other | Attending: Internal Medicine | Admitting: Internal Medicine

## 2021-07-16 ENCOUNTER — Emergency Department (HOSPITAL_COMMUNITY): Payer: Medicare Other

## 2021-07-16 DIAGNOSIS — D649 Anemia, unspecified: Secondary | ICD-10-CM | POA: Diagnosis present

## 2021-07-16 DIAGNOSIS — F17290 Nicotine dependence, other tobacco product, uncomplicated: Secondary | ICD-10-CM | POA: Diagnosis present

## 2021-07-16 DIAGNOSIS — Z8546 Personal history of malignant neoplasm of prostate: Secondary | ICD-10-CM | POA: Diagnosis not present

## 2021-07-16 DIAGNOSIS — E662 Morbid (severe) obesity with alveolar hypoventilation: Secondary | ICD-10-CM | POA: Diagnosis present

## 2021-07-16 DIAGNOSIS — J159 Unspecified bacterial pneumonia: Secondary | ICD-10-CM | POA: Diagnosis present

## 2021-07-16 DIAGNOSIS — K5900 Constipation, unspecified: Secondary | ICD-10-CM | POA: Diagnosis present

## 2021-07-16 DIAGNOSIS — Z20822 Contact with and (suspected) exposure to covid-19: Secondary | ICD-10-CM | POA: Diagnosis present

## 2021-07-16 DIAGNOSIS — J9601 Acute respiratory failure with hypoxia: Principal | ICD-10-CM | POA: Diagnosis present

## 2021-07-16 DIAGNOSIS — E1165 Type 2 diabetes mellitus with hyperglycemia: Secondary | ICD-10-CM | POA: Diagnosis present

## 2021-07-16 DIAGNOSIS — Z6831 Body mass index (BMI) 31.0-31.9, adult: Secondary | ICD-10-CM

## 2021-07-16 DIAGNOSIS — J441 Chronic obstructive pulmonary disease with (acute) exacerbation: Secondary | ICD-10-CM | POA: Diagnosis present

## 2021-07-16 DIAGNOSIS — Z7982 Long term (current) use of aspirin: Secondary | ICD-10-CM

## 2021-07-16 DIAGNOSIS — R590 Localized enlarged lymph nodes: Secondary | ICD-10-CM | POA: Diagnosis present

## 2021-07-16 DIAGNOSIS — Z9911 Dependence on respirator [ventilator] status: Secondary | ICD-10-CM | POA: Diagnosis not present

## 2021-07-16 DIAGNOSIS — R338 Other retention of urine: Secondary | ICD-10-CM | POA: Diagnosis present

## 2021-07-16 DIAGNOSIS — I248 Other forms of acute ischemic heart disease: Secondary | ICD-10-CM | POA: Diagnosis present

## 2021-07-16 DIAGNOSIS — N401 Enlarged prostate with lower urinary tract symptoms: Secondary | ICD-10-CM | POA: Diagnosis present

## 2021-07-16 DIAGNOSIS — R531 Weakness: Secondary | ICD-10-CM

## 2021-07-16 DIAGNOSIS — E781 Pure hyperglyceridemia: Secondary | ICD-10-CM | POA: Diagnosis present

## 2021-07-16 DIAGNOSIS — Z79899 Other long term (current) drug therapy: Secondary | ICD-10-CM

## 2021-07-16 DIAGNOSIS — J80 Acute respiratory distress syndrome: Secondary | ICD-10-CM | POA: Diagnosis present

## 2021-07-16 DIAGNOSIS — E870 Hyperosmolality and hypernatremia: Secondary | ICD-10-CM | POA: Diagnosis not present

## 2021-07-16 DIAGNOSIS — J440 Chronic obstructive pulmonary disease with acute lower respiratory infection: Secondary | ICD-10-CM | POA: Diagnosis present

## 2021-07-16 DIAGNOSIS — G8929 Other chronic pain: Secondary | ICD-10-CM | POA: Diagnosis present

## 2021-07-16 DIAGNOSIS — J9801 Acute bronchospasm: Secondary | ICD-10-CM | POA: Diagnosis present

## 2021-07-16 DIAGNOSIS — G9340 Encephalopathy, unspecified: Secondary | ICD-10-CM | POA: Diagnosis not present

## 2021-07-16 DIAGNOSIS — F329 Major depressive disorder, single episode, unspecified: Secondary | ICD-10-CM | POA: Diagnosis present

## 2021-07-16 DIAGNOSIS — Z833 Family history of diabetes mellitus: Secondary | ICD-10-CM | POA: Diagnosis not present

## 2021-07-16 DIAGNOSIS — E876 Hypokalemia: Secondary | ICD-10-CM | POA: Diagnosis present

## 2021-07-16 DIAGNOSIS — F1410 Cocaine abuse, uncomplicated: Secondary | ICD-10-CM | POA: Diagnosis present

## 2021-07-16 DIAGNOSIS — I10 Essential (primary) hypertension: Secondary | ICD-10-CM | POA: Diagnosis present

## 2021-07-16 DIAGNOSIS — J9621 Acute and chronic respiratory failure with hypoxia: Secondary | ICD-10-CM | POA: Diagnosis present

## 2021-07-16 DIAGNOSIS — E669 Obesity, unspecified: Secondary | ICD-10-CM | POA: Diagnosis present

## 2021-07-16 DIAGNOSIS — I5030 Unspecified diastolic (congestive) heart failure: Secondary | ICD-10-CM | POA: Diagnosis not present

## 2021-07-16 DIAGNOSIS — G928 Other toxic encephalopathy: Secondary | ICD-10-CM | POA: Diagnosis not present

## 2021-07-16 DIAGNOSIS — I350 Nonrheumatic aortic (valve) stenosis: Secondary | ICD-10-CM | POA: Diagnosis present

## 2021-07-16 DIAGNOSIS — R140 Abdominal distension (gaseous): Secondary | ICD-10-CM | POA: Diagnosis present

## 2021-07-16 DIAGNOSIS — F4310 Post-traumatic stress disorder, unspecified: Secondary | ICD-10-CM | POA: Diagnosis present

## 2021-07-16 DIAGNOSIS — G629 Polyneuropathy, unspecified: Secondary | ICD-10-CM | POA: Diagnosis present

## 2021-07-16 DIAGNOSIS — T380X5A Adverse effect of glucocorticoids and synthetic analogues, initial encounter: Secondary | ICD-10-CM | POA: Diagnosis present

## 2021-07-16 DIAGNOSIS — J189 Pneumonia, unspecified organism: Secondary | ICD-10-CM | POA: Diagnosis not present

## 2021-07-16 DIAGNOSIS — Z781 Physical restraint status: Secondary | ICD-10-CM

## 2021-07-16 LAB — PROTIME-INR
INR: 1.1 (ref 0.8–1.2)
Prothrombin Time: 14.3 seconds (ref 11.4–15.2)

## 2021-07-16 LAB — I-STAT ARTERIAL BLOOD GAS, ED
Acid-base deficit: 2 mmol/L (ref 0.0–2.0)
Bicarbonate: 20.9 mmol/L (ref 20.0–28.0)
Calcium, Ion: 1.12 mmol/L — ABNORMAL LOW (ref 1.15–1.40)
HCT: 40 % (ref 39.0–52.0)
Hemoglobin: 13.6 g/dL (ref 13.0–17.0)
O2 Saturation: 91 %
Potassium: 3.3 mmol/L — ABNORMAL LOW (ref 3.5–5.1)
Sodium: 138 mmol/L (ref 135–145)
TCO2: 22 mmol/L (ref 22–32)
pCO2 arterial: 30.4 mmHg — ABNORMAL LOW (ref 32–48)
pH, Arterial: 7.446 (ref 7.35–7.45)
pO2, Arterial: 57 mmHg — ABNORMAL LOW (ref 83–108)

## 2021-07-16 LAB — CBC WITH DIFFERENTIAL/PLATELET
Abs Immature Granulocytes: 0.08 10*3/uL — ABNORMAL HIGH (ref 0.00–0.07)
Basophils Absolute: 0 10*3/uL (ref 0.0–0.1)
Basophils Relative: 0 %
Eosinophils Absolute: 0 10*3/uL (ref 0.0–0.5)
Eosinophils Relative: 0 %
HCT: 38.7 % — ABNORMAL LOW (ref 39.0–52.0)
Hemoglobin: 13.2 g/dL (ref 13.0–17.0)
Immature Granulocytes: 1 %
Lymphocytes Relative: 7 %
Lymphs Abs: 1.2 10*3/uL (ref 0.7–4.0)
MCH: 27.7 pg (ref 26.0–34.0)
MCHC: 34.1 g/dL (ref 30.0–36.0)
MCV: 81.1 fL (ref 80.0–100.0)
Monocytes Absolute: 0.8 10*3/uL (ref 0.1–1.0)
Monocytes Relative: 5 %
Neutro Abs: 14.7 10*3/uL — ABNORMAL HIGH (ref 1.7–7.7)
Neutrophils Relative %: 87 %
Platelets: 203 10*3/uL (ref 150–400)
RBC: 4.77 MIL/uL (ref 4.22–5.81)
RDW: 15.1 % (ref 11.5–15.5)
WBC: 16.8 10*3/uL — ABNORMAL HIGH (ref 4.0–10.5)
nRBC: 0 % (ref 0.0–0.2)

## 2021-07-16 LAB — I-STAT VENOUS BLOOD GAS, ED
Acid-base deficit: 1 mmol/L (ref 0.0–2.0)
Bicarbonate: 20.9 mmol/L (ref 20.0–28.0)
Calcium, Ion: 1.03 mmol/L — ABNORMAL LOW (ref 1.15–1.40)
HCT: 42 % (ref 39.0–52.0)
Hemoglobin: 14.3 g/dL (ref 13.0–17.0)
O2 Saturation: 88 %
Potassium: 2.7 mmol/L — CL (ref 3.5–5.1)
Sodium: 136 mmol/L (ref 135–145)
TCO2: 22 mmol/L (ref 22–32)
pCO2, Ven: 26.6 mmHg — ABNORMAL LOW (ref 44–60)
pH, Ven: 7.503 — ABNORMAL HIGH (ref 7.25–7.43)
pO2, Ven: 48 mmHg — ABNORMAL HIGH (ref 32–45)

## 2021-07-16 LAB — BASIC METABOLIC PANEL
Anion gap: 13 (ref 5–15)
Anion gap: 14 (ref 5–15)
BUN: 13 mg/dL (ref 8–23)
BUN: 13 mg/dL (ref 8–23)
CO2: 20 mmol/L — ABNORMAL LOW (ref 22–32)
CO2: 18 mmol/L — ABNORMAL LOW (ref 22–32)
Calcium: 8.2 mg/dL — ABNORMAL LOW (ref 8.9–10.3)
Calcium: 8.4 mg/dL — ABNORMAL LOW (ref 8.9–10.3)
Chloride: 102 mmol/L (ref 98–111)
Chloride: 104 mmol/L (ref 98–111)
Creatinine, Ser: 1.12 mg/dL (ref 0.61–1.24)
Creatinine, Ser: 0.97 mg/dL (ref 0.61–1.24)
GFR, Estimated: >60 mL/min (ref >60)
GFR, Estimated: >60 mL/min (ref >60)
Glucose, Bld: 162 mg/dL — ABNORMAL HIGH (ref 70–99)
Glucose, Bld: 179 mg/dL — ABNORMAL HIGH (ref 70–99)
Potassium: 2.7 mmol/L — CL (ref 3.5–5.1)
Potassium: 3.5 mmol/L (ref 3.5–5.1)
Sodium: 135 mmol/L (ref 135–145)
Sodium: 136 mmol/L (ref 135–145)

## 2021-07-16 LAB — STREP PNEUMONIAE URINARY ANTIGEN: Strep Pneumo Urinary Antigen: NEGATIVE

## 2021-07-16 LAB — MAGNESIUM: Magnesium: 1.9 mg/dL (ref 1.7–2.4)

## 2021-07-16 LAB — HIV ANTIBODY (ROUTINE TESTING W REFLEX): HIV Screen 4th Generation wRfx: NONREACTIVE

## 2021-07-16 LAB — D-DIMER, QUANTITATIVE: D-Dimer, Quant: 1.13 ug/mL-FEU — ABNORMAL HIGH (ref 0.00–0.50)

## 2021-07-16 LAB — LACTIC ACID, PLASMA
Lactic Acid, Venous: 0.9 mmol/L (ref 0.5–1.9)
Lactic Acid, Venous: 1 mmol/L (ref 0.5–1.9)

## 2021-07-16 LAB — RESP PANEL BY RT-PCR (FLU A&B, COVID) ARPGX2
Influenza A by PCR: NEGATIVE
Influenza B by PCR: NEGATIVE
SARS Coronavirus 2 by RT PCR: NEGATIVE

## 2021-07-16 LAB — BRAIN NATRIURETIC PEPTIDE: B Natriuretic Peptide: 237.4 pg/mL — ABNORMAL HIGH (ref 0.0–100.0)

## 2021-07-16 LAB — CBG MONITORING, ED
Glucose-Capillary: 195 mg/dL — ABNORMAL HIGH (ref 70–99)
Glucose-Capillary: 220 mg/dL — ABNORMAL HIGH (ref 70–99)

## 2021-07-16 LAB — TROPONIN I (HIGH SENSITIVITY)
Troponin I (High Sensitivity): 42 ng/L — ABNORMAL HIGH (ref <18)
Troponin I (High Sensitivity): 52 ng/L — ABNORMAL HIGH (ref <18)

## 2021-07-16 LAB — APTT: aPTT: 31 seconds (ref 24–36)

## 2021-07-16 LAB — HEMOGLOBIN A1C
Hgb A1c MFr Bld: 6.6 % — ABNORMAL HIGH (ref 4.8–5.6)
Mean Plasma Glucose: 142.72 mg/dL

## 2021-07-16 LAB — PROCALCITONIN: Procalcitonin: 0.53 ng/mL

## 2021-07-16 MED ORDER — SODIUM CHLORIDE 0.9 % IV SOLN
2.0000 g | INTRAVENOUS | Status: AC
  Administered 2021-07-16 – 2021-07-20 (×5): 2 g via INTRAVENOUS
  Filled 2021-07-16 (×6): qty 20

## 2021-07-16 MED ORDER — ENOXAPARIN SODIUM 40 MG/0.4ML IJ SOSY
40.0000 mg | PREFILLED_SYRINGE | INTRAMUSCULAR | Status: DC
  Administered 2021-07-16 – 2021-07-30 (×15): 40 mg via SUBCUTANEOUS
  Filled 2021-07-16 (×15): qty 0.4

## 2021-07-16 MED ORDER — IOHEXOL 350 MG/ML SOLN
100.0000 mL | Freq: Once | INTRAVENOUS | Status: AC | PRN
  Administered 2021-07-16: 100 mL via INTRAVENOUS

## 2021-07-16 MED ORDER — ONDANSETRON HCL 4 MG/2ML IJ SOLN
4.0000 mg | Freq: Four times a day (QID) | INTRAMUSCULAR | Status: DC | PRN
Start: 2021-07-16 — End: 2021-07-31

## 2021-07-16 MED ORDER — INSULIN ASPART 100 UNIT/ML IJ SOLN
0.0000 [IU] | Freq: Three times a day (TID) | INTRAMUSCULAR | Status: DC
  Administered 2021-07-16 – 2021-07-17 (×2): 3 [IU] via SUBCUTANEOUS
  Administered 2021-07-17: 5 [IU] via SUBCUTANEOUS

## 2021-07-16 MED ORDER — LACTATED RINGERS IV SOLN: INTRAVENOUS | Status: DC

## 2021-07-16 MED ORDER — AMLODIPINE BESYLATE 5 MG PO TABS
5.0000 mg | ORAL_TABLET | Freq: Every day | ORAL | Status: DC
  Administered 2021-07-17 – 2021-07-18 (×2): 5 mg via ORAL
  Filled 2021-07-16 (×2): qty 1

## 2021-07-16 MED ORDER — CARVEDILOL 12.5 MG PO TABS
6.2500 mg | ORAL_TABLET | Freq: Two times a day (BID) | ORAL | Status: DC
  Administered 2021-07-16 – 2021-07-18 (×4): 6.25 mg via ORAL
  Filled 2021-07-16 (×2): qty 1
  Filled 2021-07-16: qty 2
  Filled 2021-07-16: qty 1

## 2021-07-16 MED ORDER — ATORVASTATIN CALCIUM 40 MG PO TABS
40.0000 mg | ORAL_TABLET | Freq: Every day | ORAL | Status: DC
  Administered 2021-07-17 – 2021-07-21 (×5): 40 mg via ORAL
  Filled 2021-07-16 (×5): qty 1

## 2021-07-16 MED ORDER — SODIUM CHLORIDE 0.9% FLUSH
3.0000 mL | Freq: Two times a day (BID) | INTRAVENOUS | Status: DC
  Administered 2021-07-16 – 2021-07-27 (×15): 3 mL via INTRAVENOUS
  Administered 2021-07-28: 10 mL via INTRAVENOUS
  Administered 2021-07-29 – 2021-07-30 (×2): 3 mL via INTRAVENOUS

## 2021-07-16 MED ORDER — IRBESARTAN 150 MG PO TABS
150.0000 mg | ORAL_TABLET | Freq: Every day | ORAL | Status: DC
  Administered 2021-07-17 – 2021-07-18 (×2): 150 mg via ORAL
  Filled 2021-07-16 (×2): qty 1

## 2021-07-16 MED ORDER — SERTRALINE HCL 50 MG PO TABS
100.0000 mg | ORAL_TABLET | Freq: Every day | ORAL | Status: DC
  Administered 2021-07-17: 100 mg via ORAL
  Filled 2021-07-16: qty 2

## 2021-07-16 MED ORDER — SODIUM CHLORIDE 0.9 % IV SOLN
500.0000 mg | INTRAVENOUS | Status: AC
  Administered 2021-07-16 – 2021-07-20 (×5): 500 mg via INTRAVENOUS
  Filled 2021-07-16 (×6): qty 5

## 2021-07-16 MED ORDER — MAGNESIUM SULFATE 2 GM/50ML IV SOLN
2.0000 g | Freq: Once | INTRAVENOUS | Status: AC
  Administered 2021-07-16: 2 g via INTRAVENOUS
  Filled 2021-07-16: qty 50

## 2021-07-16 MED ORDER — PREGABALIN 75 MG PO CAPS
200.0000 mg | ORAL_CAPSULE | Freq: Two times a day (BID) | ORAL | Status: DC
  Administered 2021-07-17 – 2021-07-18 (×3): 200 mg via ORAL
  Filled 2021-07-16 (×3): qty 1

## 2021-07-16 MED ORDER — TAMSULOSIN HCL 0.4 MG PO CAPS
0.4000 mg | ORAL_CAPSULE | Freq: Every day | ORAL | Status: DC
  Administered 2021-07-17: 0.4 mg via ORAL
  Filled 2021-07-16: qty 1

## 2021-07-16 MED ORDER — POLYETHYLENE GLYCOL 3350 17 G PO PACK: 17.0000 g | PACK | Freq: Every day | ORAL | Status: DC | PRN

## 2021-07-16 MED ORDER — PANTOPRAZOLE SODIUM 40 MG PO TBEC
40.0000 mg | DELAYED_RELEASE_TABLET | Freq: Every day | ORAL | Status: DC
  Administered 2021-07-17: 40 mg via ORAL
  Filled 2021-07-16: qty 1

## 2021-07-16 MED ORDER — LACTATED RINGERS IV BOLUS (SEPSIS)
1000.0000 mL | Freq: Once | INTRAVENOUS | Status: AC
  Administered 2021-07-16: 1000 mL via INTRAVENOUS

## 2021-07-16 MED ORDER — POTASSIUM CHLORIDE 10 MEQ/100ML IV SOLN
10.0000 meq | INTRAVENOUS | Status: AC
  Administered 2021-07-16 (×5): 10 meq via INTRAVENOUS
  Filled 2021-07-16 (×5): qty 100

## 2021-07-16 MED ORDER — ACETAMINOPHEN 325 MG PO TABS
650.0000 mg | ORAL_TABLET | Freq: Four times a day (QID) | ORAL | Status: DC | PRN
  Administered 2021-07-17 – 2021-07-29 (×4): 650 mg via ORAL
  Filled 2021-07-16 (×4): qty 2

## 2021-07-16 MED ORDER — IPRATROPIUM-ALBUTEROL 0.5-2.5 (3) MG/3ML IN SOLN
3.0000 mL | Freq: Once | RESPIRATORY_TRACT | Status: AC
  Administered 2021-07-16: 3 mL via RESPIRATORY_TRACT
  Filled 2021-07-16: qty 3

## 2021-07-16 MED ORDER — IPRATROPIUM-ALBUTEROL 0.5-2.5 (3) MG/3ML IN SOLN
3.0000 mL | RESPIRATORY_TRACT | Status: AC
  Administered 2021-07-16 – 2021-07-17 (×6): 3 mL via RESPIRATORY_TRACT
  Filled 2021-07-16 (×6): qty 3

## 2021-07-16 MED ORDER — POTASSIUM CHLORIDE 20 MEQ PO PACK
40.0000 meq | PACK | Freq: Two times a day (BID) | ORAL | Status: DC
  Filled 2021-07-16: qty 2

## 2021-07-16 MED ORDER — METHYLPREDNISOLONE SODIUM SUCC 125 MG IJ SOLR
60.0000 mg | Freq: Once | INTRAMUSCULAR | Status: AC
  Administered 2021-07-16: 60 mg via INTRAVENOUS
  Filled 2021-07-16: qty 2

## 2021-07-16 MED ORDER — PREDNISONE 20 MG PO TABS: 40.0000 mg | ORAL_TABLET | Freq: Every day | ORAL | Status: DC

## 2021-07-16 MED ORDER — ONDANSETRON HCL 4 MG PO TABS: 4.0000 mg | ORAL_TABLET | Freq: Four times a day (QID) | ORAL | Status: DC | PRN

## 2021-07-16 MED ORDER — ACETAMINOPHEN 650 MG RE SUPP: 650.0000 mg | Freq: Four times a day (QID) | RECTAL | Status: DC | PRN

## 2021-07-16 NOTE — ED Notes (Signed)
Patient changed into clean brief. Patient's linen change as well.  ?

## 2021-07-16 NOTE — Progress Notes (Signed)
Pt placed on Bipap due to WOB and oxygen demand. Pt is on 10/5 on 40% and is tolerating well. RT will monitor. ?

## 2021-07-16 NOTE — Sepsis Progress Note (Signed)
[facility] is following this code sepsis ?

## 2021-07-16 NOTE — H&P (Addendum)
? ? ? ?Date: 07/16/2021     ?     ?     ?Patient Name:  KASYN ROLPH MRN: [id number]  ?DOB: June 12, 1955 Age / Sex: 66 y.o., male   ?[facility]P: [facility] [place]    ?     ?Medical Service: Internal Medicine Teaching Service    ?     ?Attending Physician: Dr. Velna Ochs, MD    ?First Contact: Dr. Mila Palmer Pager: [phone]  ?Second Contact: Dr. Coy Saunas Pager: [phone]  ?     ?After Hours (After 5p/  First Contact Pager: [phone]  ?weekends / holidays): Second Contact Pager: [phone]  ? ?Chief Complaint: Shortness of breathe ? ?History of Present Illness: ? ?Mr. Cora Stetson is a 66 y/o male with a PMHx of HTN, aortic valve stenosis, NVST/PSVT w/ loop recorder placed, prostate carcinoma, BPH, PTSD, MDD who presents to the ED with c/o shortness of breathe. ? ?Mr. Vangieson states that on Sunday, May 7, he developed rapidly progressive shortness of breath that was unrelieved by any particular position.  He developed a cough that was productive but he is unable to describe his sputum.  He endorses feeling hot and cold, but was unable to check his temperature.  Additional symptoms that he was experiencing at the time include polyarthralgia, abdominal bloating, and chest pain with cough.  He denies any rhinorrhea, congestion, nausea, vomiting, diarrhea, extremity swelling, palpitations.   ? ?Mr. Burright denies any prior history of pulmonary disease, but notes he has been smoking for "his entire life."  He denies formal diagnosis of COPD or any prior episodes of pneumonia.  In addition, he used cocaine via inhalation the day before symptom onset; he denies any prior breathing difficulties when using cocaine. ? ?ED Course:  ?Per EMS report, patient was evaluated at home due to shortness of breath and cough.  On their arrival, patient was saturating at 70% on room air, which increased to 92% on NRB.  On arrival to the ED, patient was tachycardic at 102 with respiratory rate of 27 and oxygen saturation of 92% on NRB.   Blood pressure elevated at 146/83.  T maximum temperature 100.1 ?F.  Chest x-ray was obtained that showed bilateral opacities concerning for pneumonia.  Initial CBC remarkable for elevated white blood count at 16.8.  CMP remarkable for potassium of 2.7 and CBG of 162.  Troponin elevated at 42 initially and 55 on repeat.  D-dimer was obtained; elevated at 1.1.  Due to this, CTA was obtained that did not show any evidence of PE.  CTA was remarkable for multifocal pneumonia predominantly involving bilateral middle and lower lobes.  CT abdomen/pelvis was obtained due to bloating with no acute findings.  Due to increased work of breathing, patient was placed on BiPAP with immediate relief.  IMTS was consulted for admission. ? ?Meds:  ? ?Patient cannot recall medications names. [facility]:  ?Tylenol PRN ?Albuterol PRN ?Amlodipine 5 mg daily  ?Atorvastatin 40 mg daily  ?Carvedilol 6.25 mg BID ?Nortriptyline 20 mg QHS ?Omeprazole 20 mg daily ?Lyrica 200 mg BID  ?Sertraline 100 mg daily ?Tamsulosin 0.4 mg daily  ?Valsartan 240 mg daily  ? ?Allergies: ?Allergies as of 07/16/2021  ? (No Known Allergies)  ? ?Past Medical History:  ?Diagnosis Date  ? Aortic atherosclerosis ([place]) 12/29/2015  ? Aortic stenosis 12/30/2015  ? AVA around 1.3-1.4 cm2, trivial MR, normal LA   size, normal IVC.  ? Back pain   ? Bulla of lung ([place])  12/29/2015  ? Heart murmur   ? Heart valve disease   ? Hepatitis C   ? Hypertension   ? Prediabetes 12/29/2015  ? Prostate cancer ([employer])   ? Tobacco abuse   ? ?Family History:  ?Family History  ?Problem Relation Age of Onset  ? Cancer Mother   ? Cancer Father   ? Cancer Sister   ? Diabetes Mellitus II Brother   ? ?Social History:  ?- Lives in [place] with his wife ?- Veteran, currently receives his medical care through the [place] ?- Tobacco use: Between half to full pack per day for at least 30-40 years ?- Alcohol use: Social use only.  No history of alcohol use disorder reported ?- Drug use: History of  polysubstance abuse with most recent use 3 days prior, cocaine inhalation ? ?Review of Systems: ?A complete ROS was negative except as per HPI.  ? ?Physical Exam: ?Blood pressure (!) 156/93, pulse 93, temperature 100.1 ?F (37.8 ?C), temperature source Axillary, resp. rate (!) 29, height '5\' 6"'$  (1.676 m), weight 83.9 kg, SpO2 91 %. ? ?Physical Exam ?Vitals and nursing note reviewed.  ?Constitutional:   ?   Appearance: He is well-developed and overweight.  ?HENT:  ?   Head: Normocephalic and atraumatic.  ?Eyes:  ?   Extraocular Movements: Extraocular movements intact.  ?   Pupils: Pupils are equal, round, and reactive to light.  ?Cardiovascular:  ?   Rate and Rhythm: Normal rate and regular rhythm.  ?   Heart sounds: Murmur (3/6 systolic murmur heard best in the RUSB) heard.  ?Pulmonary:  ?   Effort: Tachypnea present. No accessory muscle usage.  ?   Breath sounds: Wheezing (mild expiratory wheezing throughout) and rales (bilateral middle and lower lung fields, most pronounced in the middle lung fields) present. No rhonchi.  ?Chest:  ?   Chest wall: No tenderness.  ?Abdominal:  ?   General: Bowel sounds are decreased. There is distension.  ?   Palpations: Abdomen is soft. There is no shifting dullness or fluid wave.  ?   Tenderness: There is no abdominal tenderness.  ?Musculoskeletal:  ?   Right lower leg: No tenderness. No edema.  ?   Left lower leg: No tenderness. No edema.  ?Skin: ?   General: Skin is warm and dry.  ?Neurological:  ?   General: No focal deficit present.  ?   Mental Status: He is alert and oriented to person, place, and time.  ?   Cranial Nerves: No cranial nerve deficit.  ?   Motor: No weakness.  ?Psychiatric:     ?   Mood and Affect: Mood normal. Mood is not anxious.     ?   Behavior: Behavior normal. Behavior is not agitated.  ? ?EKG: personally reviewed my interpretation is: Sinus tachycardia with 2 PVCs noted. No ischemic ST or T wave changes.  ? ?CXR: personally reviewed my interpretation is:  Multi-focal infiltrates most pronounced in the right middle lobe and left lower. No pleural effusions.  ? ?Assessment & Plan by Problem: ?Principal Problem: ?  Acute respiratory failure with hypoxia (HCC) ? ?Mr. Wigger is a 66 y/o male with a PMHx of HTN, tobacco use disorder, aortic valve stenosis who is currently admitted for multi-focal CAP complicated by acute hypoxic respiratory failure.  ? ?# Multi-focal CAP ?# Acute Hypoxic Respiratory Failure  ?Mr. Thorner is presenting with cough and shortness of breath with evidence of multifocal pneumonia on imaging consistent with multi-focal pneumonia.  Unfortunately he is experiencing severe hypoxic respiratory failure requiring BiPAP. No evidence of sepsis at this time, although at high risk for clinical deterioration, including the need for intubation.  Additionally, patient has diffuse expiratory wheezes on examination with extensive tobacco use disorder, suspicious for underlying COPD.  I do suspect this is playing a role in his clinical picture.  Patient will need PFTs once recovered. ? ?- Continue BiPAP for now. Weaning trial to HFNC failed due to tachypnea and hypoxia ?- Start DuoNebs every 4 hours ?- Additional weaning trial this afternoon ?- Continue ceftriaxone and azithromycin ?- Strep pneumonia and Legionella urinary antigen pending ? ?# Hypertension ?Per chart review, patient has a past medical history of hypertension currently being managed with amlodipine, valsartan, and carvedilol.  Mr. Keesey cannot recall any of his medications by name but states he takes several per day.  We will restart home medications while here. ? ?- Continue amlodipine and carvedilol ?- Substitute valsartan for irbesartan while admitted ? ?# Type 2 Diabetes Mellitus  ?# Hyperglycemia ?Prior history of pre-diabetes only.  A1c on admission elevated at 6.6%. Will need dietary education prior to discharge with possible introduction of Metformin.  ? ?- Consult diabetic educator  prior to discharge ?- CBG monitoring while on steroids. Low threshold to start SSI.  ? ?# Hypokalemia  ?Potentially in the setting of poor p.o. intake due to acute illness. ? ?- Continue IV potassium suppleme

## 2021-07-16 NOTE — ED Notes (Signed)
Respiratory called to place pt back on BiPAP per MD  ?

## 2021-07-16 NOTE — ED Notes (Signed)
Pt noted to be out of breath when talking to family. Pt o2 saturation dropped to the 80's. RT and RN at beside and applied NRB over HFNC. O2 saturation came up to 94%. Pt educated on trying to make effort not to talk or exert himself because of his oxygen needs. Pt still refusing bipap. MD notified.  ?

## 2021-07-16 NOTE — Progress Notes (Addendum)
2030: Patient evaluated at bedside to discuss current therapy and prognosis. He appears somewhat uncomfortable and restless in bed with diaphoresis, wearing both HFNC and NRB at total of FiO2 100% and 50 L/min, maintaining at best 93% SpO2. When he gets worked up during conversation this drops further into the upper 80's.  ? ?His daughter who he says is POA, and another loved one are present during our discussion. He states that he feels anxious and claustrophobic while on the BiPAP which is why he is refusing to wear it. He feels hot and sweaty and is repeatedly asking that the temperature in his room be turned down and that he gets some ice. ? ?We discussed the severity of his current illness and I explained that almost all of his lung tissue is on both sides is looks bad which is why his oxygen level is so high to keep at a safe level. I showed him and family present his CT chest done when he came into the ED. I also obtained a STAT ABG which showed pH 7.446, pCO2 30.4, pO2 57, HCO3 20.9. We explained that the BiPAP helps not only to keep his airway open so that he can get the oxygen into his body more efficiently, but that it also helps him get rid of excess CO2. ? ?He stated that if he had it his way he would get up right now and go home. I frankly told him that if he were to do this, he would almost certainly die.  ? ?Our team explained that what we would like to do is have him use BiPAP for around 6 hours to see how that improves his breathing while we give the steroids and antibiotics he is receiving time to start working. I explained that at his current state, if he were to continue without BiPAP, that he is at very, very high risk for needing emergent intubation and ICU transfer. This is also a risk with BiPAP. He does not want intubation if he can avoid it. ? ?He does not wish to have long-term intubation but is agreeable to FULL CODE once it is explained that this refers to an emergency situation. Our  team explained to him and his family that if he were to need intubation, he would be in the ICU and he would continue receiving treatment for his lungs and over several days if there is improvement of his condition, the team will try to wean him from the ventilator. ? ?At this time RT has been contacted to place BiPAP and patient has been provided ice chips, a fan, and a request has been made to decrease the temperature in his room. I am hopeful that by making him more comfortable he will tolerate the BiPAP for several hours. ? ?Additional STAT ABG ordered for 1 AM. Will continue to monitor closely. He continues to be high risk for intubation. ? ? ? ?0215: Repeat ABG did show some improvement with BiPAP use, pH 7.46, pCO2 36, pO2 71, HCO3 25.7. Patient still on BiPAP and tolerating well. ? ? ? ?0320: Paged by RN as patient met our goal duration for wearing BiPAP and he was requesting that it be taken off. At that time, VSS and ABG earlier in the night showed improvement. RT contacted and patient was tolerated on HFNC+NRB. ? ? ? ?0425: Paged by RN due to patient now becoming tachycardic, highly anxious, and having declining SpO2 despite HFNC+NRB @ 50L/min total, FiO2 100%. At bedside patient is  quite anxious, tachycardic, tachypneic, and again very much against putting BiPAP back on as it makes him feel anxious and claustrophobic. At that time he told our team, "I would rather be intubated than go back on BiPAP." Solumedrol 125 mg x1 dose and duoneb treatment ordered for to see if he experienced any improvement. ? ? ? ?1173: With help of RN and RT patient has agreed to go back on BiPAP. He is diaphoretic and anxious, with slowly improving tachypnea. At this time he is back on BiPAP and SpO2 is improving as well. I have ordered hydroxyzine 25 mg IM  q6h PRN anxiety.  ? ? ? ?0500: Patient began decompensating from a respiratory standpoint with RR 40 and difficulty maintaining SpO2 in 80s. Rapid called, RT called back  to bedside, PCCM consulted for intubation and transfer needs. Patient transferred to 2M03. ? ?Farrel Gordon, DO ?

## 2021-07-16 NOTE — ED Notes (Signed)
ED TO INPATIENT HANDOFF REPORT ? ?ED Nurse Name and Phone #: [phone] ? ?S ?Name/Age/Gender ?[facility] ?66 y.o. ?male ?Room/Bed: 015C/015C ? ?Code Status ?  Code Status: Full Code ? ?Home/SNF/Other ?Home ?Patient oriented to: self, place, time, and situation ?Is this baseline? Yes  ? ?Triage Complete: Triage complete  ?Chief Complaint ?Acute respiratory failure with hypoxia ([facility]) [J96.01] ? ?Triage Note ?Pt to ED via EMS from home with c/o shortness of breath with productive cough x 2 days. EMS reports pt was 70% on room air, increased to 92% on 15L NRB. Patient alert and oriented x 4 in ED, distended abdomen, labored breathing. Hx COPD.  ? ?Ems V/S: ?156/90 ?169 CBG ?  ? ?Allergies ?No Known Allergies ? ?Level of Care/Admitting Diagnosis ?ED Disposition   ? ? ED Disposition  ?Admit  ? Condition  ?--  ? Comment  ?Hospital [facility] [[id number]] ? Level of Care: Progressive [102] ? Admit to Progressive based on following criteria: RESPIRATORY PROBLEMS hypoxemic/hypercapnic respiratory failure that is responsive to NIPPV (BiPAP) or High Flow Nasal Cannula (6-80 lpm). Frequent assessment/intervention, no > Q2 hrs < Q4 hrs, to maintain oxygenation and pulmonary hygiene. ? May admit patient to [facility] if equivalent level of care is available:: No ? Covid Evaluation: Confirmed COVID Negative ? Diagnosis: Acute respiratory failure with hypoxia ([facility]) [[id number]] ? Admitting Physician: Sid Falcon [phone] ? Attending Physician: Sid Falcon [phone] ? Estimated length of stay: past midnight tomorrow ? Certification:: I certify this patient will need inpatient services for at least 2 midnights ?  ?  ? ?  ? ? ?B ?Medical/Surgery History ?Past Medical History:  ?Diagnosis Date  ? Aortic atherosclerosis ([facility]) 12/29/2015  ? Aortic stenosis 12/30/2015  ? AVA around 1.3-1.4 cm2, trivial MR, normal LA   size, normal IVC.  ? Back pain   ? Bulla of lung ([facility]) 12/29/2015  ? Heart murmur    ? Heart valve disease   ? Hepatitis C   ? Hypertension   ? Prediabetes 12/29/2015  ? Prostate cancer ([facility])   ? Tobacco abuse   ? ?Past Surgical History:  ?Procedure Laterality Date  ? BACK SURGERY    ?  ? ?A ?IV Location/Drains/Wounds ?Patient Lines/Drains/Airways Status   ? ? Active Line/Drains/Airways   ? ? Name Placement date Placement time Site Days  ? Peripheral IV 07/16/21 20 G Left Antecubital 07/16/21  0754  Antecubital  less than 1  ? Peripheral IV 07/16/21 20 G Right Hand 07/16/21  0900  Hand  less than 1  ? External Urinary Catheter 07/16/21  1607  --  less than 1  ? ?  ?  ? ?  ? ? ?Intake/Output Last 24 hours ?No intake or output data in the 24 hours ending 07/16/21 2134 ? ?Labs/Imaging ?Results for orders placed or performed during the hospital encounter of 07/16/21 (from the past 48 hour(s))  ?Basic metabolic panel     Status: Abnormal  ? Collection Time: 07/16/21  8:05 AM  ?Result Value Ref Range  ? Sodium 135 135 - 145 mmol/L  ? Potassium 2.7 (LL) 3.5 - 5.1 mmol/L  ?  Comment: CRITICAL RESULT CALLED TO, READ BACK BY AND VERIFIED WITH: ?A OAKLEY RN 07/16/2021 0908 DAVISB ?  ? Chloride 102 98 - 111 mmol/L  ? CO2 20 (L) 22 - 32 mmol/L  ? Glucose, Bld 162 (H) 70 - 99 mg/dL  ?  Comment: Glucose reference range  applies only to samples taken after fasting for at least 8 hours.  ? BUN 13 8 - 23 mg/dL  ? Creatinine, Ser 1.12 0.61 - 1.24 mg/dL  ? Calcium 8.2 (L) 8.9 - 10.3 mg/dL  ? GFR, Estimated >60 >60 mL/min  ?  Comment: (NOTE) ?Calculated using the CKD-EPI Creatinine Equation (2021) ?  ? Anion gap 13 5 - 15  ?  Comment: Performed at [facility], [place] [address] [place], [place] [address]  ?CBC with Differential     Status: Abnormal  ? Collection Time: 07/16/21  8:05 AM  ?Result Value Ref Range  ? WBC 16.8 (H) 4.0 - 10.5 K/uL  ? RBC 4.77 4.22 - 5.81 MIL/uL  ? Hemoglobin 13.2 13.0 - 17.0 g/dL  ? HCT 38.7 (L) 39.0 - 52.0 %  ? MCV 81.1 80.0 - 100.0 fL  ? MCH 27.7 26.0 - 34.0 pg  ? MCHC 34.1 30.0 - 36.0  g/dL  ? RDW 15.1 11.5 - 15.5 %  ? Platelets 203 150 - 400 K/uL  ? nRBC 0.0 0.0 - 0.2 %  ? Neutrophils Relative % 87 %  ? Neutro Abs 14.7 (H) 1.7 - 7.7 K/uL  ? Lymphocytes Relative 7 %  ? Lymphs Abs 1.2 0.7 - 4.0 K/uL  ? Monocytes Relative 5 %  ? Monocytes Absolute 0.8 0.1 - 1.0 K/uL  ? Eosinophils Relative 0 %  ? Eosinophils Absolute 0.0 0.0 - 0.5 K/uL  ? Basophils Relative 0 %  ? Basophils Absolute 0.0 0.0 - 0.1 K/uL  ? Immature Granulocytes 1 %  ? Abs Immature Granulocytes 0.08 (H) 0.00 - 0.07 K/uL  ?  Comment: Performed at [facility], [place] [address] [place], [place] [address]  ?Brain natriuretic peptide     Status: Abnormal  ? Collection Time: 07/16/21  8:05 AM  ?Result Value Ref Range  ? B Natriuretic Peptide 237.4 (H) 0.0 - 100.0 pg/mL  ?  Comment: Performed at [facility], [place] [address] [place], [place] [address]  ?D-dimer, quantitative     Status: Abnormal  ? Collection Time: 07/16/21  8:05 AM  ?Result Value Ref Range  ? D-Dimer, Quant 1.13 (H) 0.00 - 0.50 ug/mL-FEU  ?  Comment: (NOTE) ?At the manufacturer cut-off value of 0.5 ?g/mL FEU, this assay has a ?negative predictive value of 95-100%.This assay is intended for use ?in conjunction with a clinical pretest probability (PTP) assessment ?model to exclude pulmonary embolism (PE) and deep venous thrombosis ?(DVT) in outpatients suspected of PE or DVT. ?Results should be correlated with clinical presentation. ?Performed at [facility], [place] [address], [place], [place] ?[address] ?  ?Troponin I (High Sensitivity)     Status: Abnormal  ? Collection Time: 07/16/21  8:05 AM  ?Result Value Ref Range  ? Troponin I (High Sensitivity) 42 (H) <18 ng/L  ?  Comment: (NOTE) ?Elevated high sensitivity troponin I (hsTnI) values and significant  ?changes across serial measurements may suggest ACS but many other  ?chronic and acute conditions are known to elevate hsTnI results.  ?Refer to the Links section for chest pain algorithms and additional   ?guidance. ?Performed at [facility], [place] [address] [place], [place] ?[address] ?  ?Lactic acid, plasma     Status: None  ? Collection Time: 07/16/21  8:05 AM  ?Result Value Ref Range  ? Lactic Acid, Venous 0.9 0.5 - 1.9 mmol/L  ?  Comment: Performed at [facility], [address], [place], [place] [address]  ?I-Stat venous blood gas, ED     Status: Abnormal  ? Collection Time: 07/16/21  8:14 AM  ?Result Value Ref Range  ? pH, Ven 7.503 (H) 7.25 - 7.43  ? pCO2, Ven 26.6 (L) 44 - 60 mmHg  ? pO2, Ven 48 (H) 32 - 45 mmHg  ? Bicarbonate 20.9 20.0 - 28.0 mmol/L  ? TCO2 22 22 - 32 mmol/L  ? O2 Saturation 88 %  ? Acid-base deficit 1.0 0.0 - 2.0 mmol/L  ? Sodium 136 135 - 145 mmol/L  ? Potassium 2.7 (LL) 3.5 - 5.1 mmol/L  ? Calcium, Ion 1.03 (L) 1.15 - 1.40 mmol/L  ? HCT 42.0 39.0 - 52.0 %  ? Hemoglobin 14.3 13.0 - 17.0 g/dL  ? Sample type VENOUS   ? Comment NOTIFIED PHYSICIAN   ?Resp Panel by RT-PCR (Flu A&B, Covid) Nasopharyngeal Swab     Status: None  ? Collection Time: 07/16/21  8:38 AM  ? Specimen: Nasopharyngeal Swab; Nasopharyngeal(NP) swabs in vial transport medium  ?Result Value Ref Range  ? SARS Coronavirus 2 by RT PCR NEGATIVE NEGATIVE  ?  Comment: (NOTE) ?SARS-CoV-2 target nucleic acids are NOT DETECTED. ? ?The SARS-CoV-2 RNA is generally detectable in upper respiratory ?specimens during the acute phase of infection. The lowest ?concentration of SARS-CoV-2 viral copies this assay can detect is ?138 copies/mL. A negative result does not preclude SARS-Cov-2 ?infection and should not be used as the sole basis for treatment or ?other patient management decisions. A negative result may occur with  ?improper specimen collection/handling, submission of specimen other ?than nasopharyngeal swab, presence of viral mutation(s) within the ?areas targeted by this assay, and inadequate number of viral ?copies(<138 copies/mL). A negative result must be combined with ?clinical observations, patient history, and  epidemiological ?information. The expected result is Negative. ? ?Fact Sheet for Patients:  ?EntrepreneurPulse.com.au ? ?Fact Sheet for Healthcare Providers:  ?SeeState.com.cy

## 2021-07-16 NOTE — Progress Notes (Signed)
Pt wants Bipap off and was tried again on a salter. Pts sats dropped to 85 and pt is refusing bipap. MD notified and wanted to try heated HFNC.  ? ?Pt placed on heated HFNC on 35L/100% and sats are 91%. RT will monitor. ?

## 2021-07-16 NOTE — Progress Notes (Signed)
MD wanted to try pt off Bipap, pt did not tolerate well. WOB increased and oxygen sats dropped to 86. RT placed pt back on bipap and gave breathing treatment. Pt is tolerating well now. RT will monitor. ?

## 2021-07-16 NOTE — ED Triage Notes (Addendum)
Pt to ED via EMS from home with c/o shortness of breath with productive cough x 2 days. EMS reports pt was 70% on room air, increased to 92% on 15L NRB. Patient alert and oriented x 4 in ED, distended abdomen, labored breathing. Hx COPD.  ? ?Ems V/S: ?156/90 ?169 CBG ? ?

## 2021-07-16 NOTE — Progress Notes (Signed)
Transported patient from ED to 6E01 without incident.   ?

## 2021-07-16 NOTE — ED Provider Notes (Signed)
?[facility] ?Provider Note ? ? ?CSN: [id number] ?Arrival date & time: 07/16/21  0744 ? ?  ? ?History ?Chief Complaint  ?Patient presents with  ? Shortness of Breath  ? ? ?Zachary Briggs is a 66 y.o. male. ? ?Patient presents with worsening dyspnea that started 2 days ago. Endorses occasional chest pain that gets worse with his breathing. Also endorsing cough with yellow productive sputum and orthopnea. Denies fever, chills, nausea, vomiting or other pain. Ambulates with assistance of walker at home and is unsure if he has been getting more short of breath with activity as he says he does not move around much at home due to his preference. Long-time smoker for as long as he can remember but has not smoked since yesterday. Does not use CPAP at home. He does not use any oxygen supplementation at baseline.  ? ?The history is provided by the patient. The history is limited by the condition of the patient. No language interpreter was used.  ?Shortness of Breath ?Severity:  Moderate ?Onset quality:  Gradual ?Duration:  2 days ?Timing:  Constant ?Progression:  Worsening ?Chronicity:  Recurrent ?Context: activity and smoke exposure   ?Relieved by:  Nothing ?Worsened by:  Deep breathing and coughing ?Associated symptoms: chest pain, cough and sputum production   ?Associated symptoms: no abdominal pain, no fever, no headaches, no vomiting and no wheezing   ?Risk factors: tobacco use   ? ?  ? ?Home Medications ?Prior to Admission medications   ?Medication Sig Start Date End Date Taking? Authorizing Provider  ?acetaminophen (TYLENOL) 325 MG tablet Take 650 mg by mouth every 4 (four) hours as needed for mild pain or fever.    [provider]  ?aspirin EC 81 MG tablet Take 81 mg by mouth daily.    [provider]  ?atorvastatin (LIPITOR) 20 MG tablet Take 20 mg by mouth daily.     [provider]  ?carvedilol (COREG) 6.25 MG tablet Take 1 tablet (6.25 mg total) by  mouth 2 (two) times daily with a meal. 04/27/19   Dorie Rank, MD  ?ciprofloxacin (CIPRO) 500 MG tablet Take 1 tablet (500 mg total) by mouth 2 (two) times daily. One po bid x 7 days ?Patient not taking: Reported on 04/27/2019 01/23/18   Daleen Bo, MD  ?docusate sodium (COLACE) 50 MG capsule Take 50 mg by mouth 2 (two) times daily as needed for mild constipation.    [provider]  ?hydrochlorothiazide (HYDRODIURIL) 25 MG tablet Take 1 tablet (25 mg total) by mouth daily. 04/27/19   Dorie Rank, MD  ?nortriptyline (PAMELOR) 10 MG capsule Take 2 capsules (20 mg total) by mouth at bedtime. 04/27/19   Dorie Rank, MD  ?pantoprazole (PROTONIX) 20 MG tablet Take 1 tablet (20 mg total) by mouth 2 (two) times daily. ?Patient not taking: Reported on 04/27/2019 05/16/17   Isla Pence, MD  ?sertraline (ZOLOFT) 100 MG tablet Take 1.5 tablets (150 mg total) by mouth daily. 04/27/19   Dorie Rank, MD  ?tamsulosin (FLOMAX) 0.4 MG CAPS capsule Take 0.4 mg by mouth at bedtime.    [provider]  ?   ? ?Allergies    ?Patient has no known allergies.   ? ?Review of Systems   ?Review of Systems  ?Constitutional:  Negative for activity change, appetite change, chills and fever.  ?HENT:  Negative for congestion.   ?Eyes:  Negative for visual disturbance.  ?Respiratory:  Positive for cough, sputum production and shortness  of breath. Negative for wheezing.   ?     Positive for orthopnea ?  ?Cardiovascular:  Positive for chest pain. Negative for leg swelling.  ?Gastrointestinal:  Negative for abdominal pain, diarrhea, nausea and vomiting.  ?Genitourinary:  Negative for dysuria.  ?Neurological:  Negative for dizziness, speech difficulty, weakness and headaches.  ? ?Physical Exam ?Updated Vital Signs ?BP (!) 151/85   Pulse 87   Temp 100.1 ?F (37.8 ?C) (Axillary)   Resp (!) 24   Ht '5\' 6"'$  (1.676 m)   Wt 83.9 kg   SpO2 90%   BMI 29.86 kg/m?  ?Physical Exam ?Vitals reviewed.  ?Constitutional:   ?   Appearance: He is  well-developed. He is not toxic-appearing.  ?HENT:  ?   Head: Normocephalic and atraumatic.  ?   Nose: Nose normal. No congestion or rhinorrhea.  ?   Mouth/Throat:  ?   Mouth: Mucous membranes are moist.  ?Eyes:  ?   General: No scleral icterus.    ?   Right eye: No discharge.     ?   Left eye: No discharge.  ?   Extraocular Movements: Extraocular movements intact.  ?Neck:  ?   Vascular: No JVD.  ?Cardiovascular:  ?   Rate and Rhythm: Normal rate and regular rhythm.  ?   Pulses: Normal pulses.  ?   Heart sounds: Normal heart sounds. No murmur heard. ?  No gallop.  ?Pulmonary:  ?   Effort: Respiratory distress present.  ?   Breath sounds: No wheezing, rhonchi or rales.  ?   Comments: In moderate respiratory distress but able to speak in complete sentences with some hesitation on 15L O2 ?Chest:  ?   Chest wall: No tenderness.  ?Abdominal:  ?   General: Bowel sounds are normal. There is distension.  ?   Palpations: Abdomen is soft.  ?   Tenderness: There is no abdominal tenderness.  ?Musculoskeletal:     ?   General: No swelling or tenderness.  ?   Cervical back: Normal range of motion.  ?   Right lower leg: No edema.  ?   Left lower leg: No edema.  ?Lymphadenopathy:  ?   Cervical: No cervical adenopathy.  ?Skin: ?   General: Skin is warm and dry.  ?   Capillary Refill: Capillary refill takes less than 2 seconds.  ?   Coloration: Skin is not pale.  ?   Findings: No erythema or rash.  ?Neurological:  ?   General: No focal deficit present.  ?   Mental Status: He is alert and oriented to person, place, and time. Mental status is at baseline.  ?Psychiatric:     ?   Mood and Affect: Mood normal.     ?   Behavior: Behavior normal.  ? ? ?ED Results / Procedures / Treatments   ?Labs ?(all labs ordered are listed, but only abnormal results are displayed) ?Labs Reviewed  ?BASIC METABOLIC PANEL - Abnormal; Notable for the following components:  ?    Result Value  ? Potassium 2.7 (*)   ? CO2 20 (*)   ? Glucose, Bld 162 (*)   ?  Calcium 8.2 (*)   ? All other components within normal limits  ?CBC WITH DIFFERENTIAL/PLATELET - Abnormal; Notable for the following components:  ? WBC 16.8 (*)   ? HCT 38.7 (*)   ? Neutro Abs 14.7 (*)   ? Abs Immature Granulocytes 0.08 (*)   ? All other components within  normal limits  ?BRAIN NATRIURETIC PEPTIDE - Abnormal; Notable for the following components:  ? B Natriuretic Peptide 237.4 (*)   ? All other components within normal limits  ?D-DIMER, QUANTITATIVE - Abnormal; Notable for the following components:  ? D-Dimer, Quant 1.13 (*)   ? All other components within normal limits  ?TROPONIN I (HIGH SENSITIVITY) - Abnormal; Notable for the following components:  ? Troponin I (High Sensitivity) 42 (*)   ? All other components within normal limits  ?TROPONIN I (HIGH SENSITIVITY) - Abnormal; Notable for the following components:  ? Troponin I (High Sensitivity) 52 (*)   ? All other components within normal limits  ?RESP PANEL BY RT-PCR (FLU A&B, COVID) [id number]  ?CULTURE, BLOOD (ROUTINE X 2)  ?CULTURE, BLOOD (ROUTINE X 2)  ?LACTIC ACID, PLASMA  ?LACTIC ACID, PLASMA  ?PROTIME-INR  ?APTT  ?MAGNESIUM  ?BLOOD GAS, VENOUS  ?URINALYSIS, ROUTINE W REFLEX MICROSCOPIC  ?PROCALCITONIN  ? ? ?EKG ?EKG Interpretation ? ?Date/Time:  Tuesday Jul 16 2021 07:59:58 EDT ?Ventricular Rate:  102 ?PR Interval:  179 ?QRS Duration: 99 ?QT Interval:  349 ?QTC Calculation: 455 ?R Axis:   -50 ?Text Interpretation: Sinus tachycardia Multiple ventricular premature complexes Left atrial enlargement Left anterior fascicular block Left ventricular hypertrophy Anterior infarct, old similar to prior tracing Confirmed by Wynona Dove ([id number]) on 07/16/2021 8:22:43 AM ? ?Radiology ?CT Angio Chest Pulmonary Embolism (PE) W or WO Contrast ? ?Result Date: 07/16/2021 ?CLINICAL DATA:  Rule out pulmonary embolism. Shortness of breath and cough for 2 days. Abdominal distension. EXAM: CT ANGIOGRAPHY CHEST CT ABDOMEN AND PELVIS WITH CONTRAST TECHNIQUE: Multidetector  CT imaging of the chest was performed using the standard protocol during bolus administration of intravenous contrast. Multiplanar CT image reconstructions and MIPs were obtained to evaluate the vascular anatomy. Mult

## 2021-07-16 NOTE — Progress Notes (Signed)
MND at bedside talked to pt about wearing Bipap. Pt agreed.  RN called to place pt back on Bipap. Pt tolerating well  ?

## 2021-07-16 NOTE — Progress Notes (Signed)
Pt taken to CT on bipap and back to room 49P without complications. RT will monitor. ?

## 2021-07-16 NOTE — Progress Notes (Signed)
Call to assess pt due to Sp02 dropped to low 80% on 35L 100% HHFNC.  Sp02 82-84%, Placed NRB over HHNC, [id number]%.  Spoke to pt about going back on Bipap for WOB and low 02%.  Bipap machine at bedside, Pt refusing to wear Bipap, stated he can handle wearing NRB mask over [place].  RN made aware. Will continue to monitor.  ?

## 2021-07-17 ENCOUNTER — Inpatient Hospital Stay (HOSPITAL_COMMUNITY): Payer: Medicare Other

## 2021-07-17 ENCOUNTER — Encounter (HOSPITAL_COMMUNITY): Payer: Self-pay | Admitting: Internal Medicine

## 2021-07-17 DIAGNOSIS — I350 Nonrheumatic aortic (valve) stenosis: Secondary | ICD-10-CM

## 2021-07-17 DIAGNOSIS — J9601 Acute respiratory failure with hypoxia: Secondary | ICD-10-CM | POA: Diagnosis not present

## 2021-07-17 DIAGNOSIS — I5030 Unspecified diastolic (congestive) heart failure: Secondary | ICD-10-CM

## 2021-07-17 DIAGNOSIS — J189 Pneumonia, unspecified organism: Secondary | ICD-10-CM | POA: Diagnosis not present

## 2021-07-17 DIAGNOSIS — R590 Localized enlarged lymph nodes: Secondary | ICD-10-CM | POA: Diagnosis not present

## 2021-07-17 LAB — CBC
HCT: 39.2 % (ref 39.0–52.0)
Hemoglobin: 13.5 g/dL (ref 13.0–17.0)
MCH: 27.8 pg (ref 26.0–34.0)
MCHC: 34.4 g/dL (ref 30.0–36.0)
MCV: 80.8 fL (ref 80.0–100.0)
Platelets: 241 10*3/uL (ref 150–400)
RBC: 4.85 MIL/uL (ref 4.22–5.81)
RDW: 14.8 % (ref 11.5–15.5)
WBC: 21.8 10*3/uL — ABNORMAL HIGH (ref 4.0–10.5)
nRBC: 0 % (ref 0.0–0.2)

## 2021-07-17 LAB — BASIC METABOLIC PANEL
Anion gap: 10 (ref 5–15)
BUN: 19 mg/dL (ref 8–23)
CO2: 22 mmol/L (ref 22–32)
Calcium: 8.7 mg/dL — ABNORMAL LOW (ref 8.9–10.3)
Chloride: 105 mmol/L (ref 98–111)
Creatinine, Ser: 1.07 mg/dL (ref 0.61–1.24)
GFR, Estimated: >60 mL/min (ref >60)
Glucose, Bld: 211 mg/dL — ABNORMAL HIGH (ref 70–99)
Potassium: 3.5 mmol/L (ref 3.5–5.1)
Sodium: 137 mmol/L (ref 135–145)

## 2021-07-17 LAB — GLUCOSE, CAPILLARY
Glucose-Capillary: 180 mg/dL — ABNORMAL HIGH (ref 70–99)
Glucose-Capillary: 208 mg/dL — ABNORMAL HIGH (ref 70–99)
Glucose-Capillary: 230 mg/dL — ABNORMAL HIGH (ref 70–99)
Glucose-Capillary: 195 mg/dL — ABNORMAL HIGH (ref 70–99)
Glucose-Capillary: 230 mg/dL — ABNORMAL HIGH (ref 70–99)
Glucose-Capillary: 204 mg/dL — ABNORMAL HIGH (ref 70–99)
Glucose-Capillary: 204 mg/dL — ABNORMAL HIGH (ref 70–99)

## 2021-07-17 LAB — BLOOD GAS, ARTERIAL
Acid-Base Excess: 1.8 mmol/L (ref 0.0–2.0)
Bicarbonate: 25.7 mmol/L (ref 20.0–28.0)
Drawn by: 244086
O2 Saturation: 96.7 %
Patient temperature: 36.5
pCO2 arterial: 36 mmHg (ref 32–48)
pH, Arterial: 7.46 — ABNORMAL HIGH (ref 7.35–7.45)
pO2, Arterial: 71 mmHg — ABNORMAL LOW (ref 83–108)

## 2021-07-17 LAB — ECHOCARDIOGRAM COMPLETE
AR max vel: 0.85 cm2
AV Area VTI: 0.92 cm2
AV Area mean vel: 0.87 cm2
AV Mean grad: 27 mmHg
AV Peak grad: 44.1 mmHg
Ao pk vel: 3.32 m/s
Area-P 1/2: 4.46 cm2
Calc EF: 48.7 %
Height: 66 in
S' Lateral: 3.8 cm
Single Plane A2C EF: 49.7 %
Single Plane A4C EF: 48.3 %
Weight: 3128.77 oz

## 2021-07-17 LAB — RESPIRATORY PANEL BY PCR

## 2021-07-17 LAB — MAGNESIUM: Magnesium: 2.3 mg/dL (ref 1.7–2.4)

## 2021-07-17 LAB — MRSA NEXT GEN BY PCR, NASAL: MRSA by PCR Next Gen: DETECTED — AB

## 2021-07-17 MED ORDER — CHLORHEXIDINE GLUCONATE CLOTH 2 % EX PADS
6.0000 | MEDICATED_PAD | Freq: Every day | CUTANEOUS | Status: DC
  Administered 2021-07-17 (×2): 6 via TOPICAL

## 2021-07-17 MED ORDER — ETOMIDATE 2 MG/ML IV SOLN
INTRAVENOUS | Status: AC
Start: 2021-07-17 — End: 2021-07-17
  Filled 2021-07-17: qty 20

## 2021-07-17 MED ORDER — SERTRALINE HCL 50 MG PO TABS
50.0000 mg | ORAL_TABLET | Freq: Every day | ORAL | Status: DC
  Administered 2021-07-18: 50 mg via ORAL
  Filled 2021-07-17: qty 1

## 2021-07-17 MED ORDER — CHLORHEXIDINE GLUCONATE CLOTH 2 % EX PADS
6.0000 | MEDICATED_PAD | Freq: Every day | CUTANEOUS | Status: DC
  Administered 2021-07-18 – 2021-07-30 (×13): 6 via TOPICAL

## 2021-07-17 MED ORDER — POTASSIUM CHLORIDE 10 MEQ/100ML IV SOLN
10.0000 meq | INTRAVENOUS | Status: AC
  Administered 2021-07-17 (×4): 10 meq via INTRAVENOUS
  Filled 2021-07-17 (×4): qty 100

## 2021-07-17 MED ORDER — FUROSEMIDE 10 MG/ML IJ SOLN
INTRAMUSCULAR | Status: AC
  Administered 2021-07-17: 20 mg via INTRAVENOUS
  Filled 2021-07-17: qty 2

## 2021-07-17 MED ORDER — METHYLPREDNISOLONE SODIUM SUCC 125 MG IJ SOLR
60.0000 mg | Freq: Two times a day (BID) | INTRAMUSCULAR | Status: DC
  Administered 2021-07-17 – 2021-07-19 (×6): 60 mg via INTRAVENOUS
  Filled 2021-07-17 (×7): qty 2

## 2021-07-17 MED ORDER — FUROSEMIDE 10 MG/ML IJ SOLN: 20.0000 mg | Freq: Once | INTRAMUSCULAR | Status: AC

## 2021-07-17 MED ORDER — LINEZOLID 600 MG/300ML IV SOLN
600.0000 mg | Freq: Two times a day (BID) | INTRAVENOUS | Status: AC
  Administered 2021-07-17 – 2021-07-23 (×14): 600 mg via INTRAVENOUS
  Filled 2021-07-17 (×14): qty 300

## 2021-07-17 MED ORDER — HYDROXYZINE HCL 50 MG/ML IM SOLN
25.0000 mg | Freq: Four times a day (QID) | INTRAMUSCULAR | Status: DC | PRN
  Administered 2021-07-17 (×2): 25 mg via INTRAMUSCULAR
  Filled 2021-07-17 (×3): qty 0.5

## 2021-07-17 MED ORDER — METHYLPREDNISOLONE SODIUM SUCC 125 MG IJ SOLR
125.0000 mg | Freq: Once | INTRAMUSCULAR | Status: AC
  Administered 2021-07-17: 125 mg via INTRAVENOUS
  Filled 2021-07-17: qty 2

## 2021-07-17 MED ORDER — FENTANYL CITRATE PF 50 MCG/ML IJ SOSY
PREFILLED_SYRINGE | INTRAMUSCULAR | Status: AC
  Filled 2021-07-17: qty 2

## 2021-07-17 MED ORDER — FUROSEMIDE 10 MG/ML IJ SOLN
60.0000 mg | Freq: Once | INTRAMUSCULAR | Status: AC
  Administered 2021-07-17: 60 mg via INTRAVENOUS
  Filled 2021-07-17: qty 6

## 2021-07-17 MED ORDER — ROCURONIUM BROMIDE 10 MG/ML (PF) SYRINGE
PREFILLED_SYRINGE | INTRAVENOUS | Status: AC
Start: 2021-07-17 — End: 2021-07-17
  Filled 2021-07-17: qty 10

## 2021-07-17 MED ORDER — SUCCINYLCHOLINE CHLORIDE 200 MG/10ML IV SOSY
PREFILLED_SYRINGE | INTRAVENOUS | Status: AC
  Filled 2021-07-17: qty 10

## 2021-07-17 MED ORDER — FENTANYL CITRATE (PF) 100 MCG/2ML IJ SOLN: 12.5000 ug | INTRAMUSCULAR | Status: DC | PRN

## 2021-07-17 MED ORDER — DEXMEDETOMIDINE HCL IN NACL 400 MCG/100ML IV SOLN
0.4000 ug/kg/h | INTRAVENOUS | Status: DC
  Administered 2021-07-17: 0.4 ug/kg/h via INTRAVENOUS
  Administered 2021-07-17 – 2021-07-18 (×3): 0.7 ug/kg/h via INTRAVENOUS
  Filled 2021-07-17: qty 100
  Filled 2021-07-17: qty 200
  Filled 2021-07-17: qty 100

## 2021-07-17 MED ORDER — MIDAZOLAM HCL 2 MG/2ML IJ SOLN
INTRAMUSCULAR | Status: AC
  Filled 2021-07-17: qty 2

## 2021-07-17 MED ORDER — PHENYLEPHRINE 80 MCG/ML (10ML) SYRINGE FOR IV PUSH (FOR BLOOD PRESSURE SUPPORT)
PREFILLED_SYRINGE | INTRAVENOUS | Status: AC
  Filled 2021-07-17: qty 20

## 2021-07-17 MED ORDER — INSULIN ASPART 100 UNIT/ML IJ SOLN
0.0000 [IU] | INTRAMUSCULAR | Status: DC
  Administered 2021-07-18 (×2): 7 [IU] via SUBCUTANEOUS
  Administered 2021-07-18 (×2): 4 [IU] via SUBCUTANEOUS
  Administered 2021-07-18: 3 [IU] via SUBCUTANEOUS
  Administered 2021-07-18 – 2021-07-19 (×3): 4 [IU] via SUBCUTANEOUS
  Administered 2021-07-19 (×2): 7 [IU] via SUBCUTANEOUS
  Administered 2021-07-19: 3 [IU] via SUBCUTANEOUS
  Administered 2021-07-19: 7 [IU] via SUBCUTANEOUS
  Administered 2021-07-20 (×2): 4 [IU] via SUBCUTANEOUS
  Administered 2021-07-20: 3 [IU] via SUBCUTANEOUS
  Administered 2021-07-21: 4 [IU] via SUBCUTANEOUS
  Administered 2021-07-21: 11 [IU] via SUBCUTANEOUS
  Administered 2021-07-21: 4 [IU] via SUBCUTANEOUS
  Administered 2021-07-21: 7 [IU] via SUBCUTANEOUS
  Administered 2021-07-21 – 2021-07-22 (×3): 4 [IU] via SUBCUTANEOUS
  Administered 2021-07-22: 3 [IU] via SUBCUTANEOUS
  Administered 2021-07-22 (×3): 4 [IU] via SUBCUTANEOUS
  Administered 2021-07-22: 7 [IU] via SUBCUTANEOUS
  Administered 2021-07-23 (×4): 4 [IU] via SUBCUTANEOUS
  Administered 2021-07-24: 3 [IU] via SUBCUTANEOUS
  Administered 2021-07-24: 7 [IU] via SUBCUTANEOUS
  Administered 2021-07-24: 3 [IU] via SUBCUTANEOUS
  Administered 2021-07-24: 4 [IU] via SUBCUTANEOUS
  Administered 2021-07-25: 3 [IU] via SUBCUTANEOUS
  Administered 2021-07-25: 4 [IU] via SUBCUTANEOUS
  Administered 2021-07-25 (×2): 7 [IU] via SUBCUTANEOUS
  Administered 2021-07-25: 3 [IU] via SUBCUTANEOUS
  Administered 2021-07-26 (×2): 4 [IU] via SUBCUTANEOUS
  Administered 2021-07-26 (×2): 7 [IU] via SUBCUTANEOUS
  Administered 2021-07-27: 3 [IU] via SUBCUTANEOUS
  Administered 2021-07-27: 4 [IU] via SUBCUTANEOUS
  Administered 2021-07-27 (×2): 3 [IU] via SUBCUTANEOUS
  Administered 2021-07-27: 4 [IU] via SUBCUTANEOUS
  Administered 2021-07-27: 3 [IU] via SUBCUTANEOUS
  Administered 2021-07-28: 4 [IU] via SUBCUTANEOUS
  Administered 2021-07-28 (×2): 3 [IU] via SUBCUTANEOUS
  Administered 2021-07-28 – 2021-07-29 (×3): 4 [IU] via SUBCUTANEOUS
  Administered 2021-07-29 (×4): 3 [IU] via SUBCUTANEOUS
  Administered 2021-07-30: 11 [IU] via SUBCUTANEOUS
  Administered 2021-07-30 (×3): 4 [IU] via SUBCUTANEOUS
  Administered 2021-07-31 (×2): 3 [IU] via SUBCUTANEOUS
  Administered 2021-07-31 (×2): 4 [IU] via SUBCUTANEOUS

## 2021-07-17 MED ORDER — INSULIN ASPART 100 UNIT/ML IJ SOLN
0.0000 [IU] | Freq: Three times a day (TID) | INTRAMUSCULAR | Status: DC
  Administered 2021-07-17: 7 [IU] via SUBCUTANEOUS

## 2021-07-17 NOTE — Progress Notes (Signed)
Messaged Dr. Marlou Sa requesting medication for anxiety. Pts heart rate 110 and respirations labored. Dr. Marlou Sa at bedside. See new orders.  ?

## 2021-07-17 NOTE — Progress Notes (Signed)
Patient transferred from [place] to 2M03 via BIPAP. ?

## 2021-07-17 NOTE — Progress Notes (Signed)
Paged Dr Marlou Sa related pt pulling BIPAP off and refusing to keep in on. Dr Marlou Sa came to bedside. See new orders.  ?

## 2021-07-17 NOTE — Progress Notes (Addendum)
?   07/17/21 0443  ?Assess: MEWS Score  ?Temp (!) 100.6 ?F (38.1 ?C)  ?BP (!) 151/93  ?Pulse Rate (!) 106  ?ECG Heart Rate (!) 106  ?Resp (!) 40  ?Level of Consciousness New agitation confusion  ?Assess: MEWS Score  ?MEWS Temp 1  ?MEWS Systolic 0  ?MEWS Pulse 1  ?MEWS RR 3  ?MEWS LOC 1  ?MEWS Score 6  ?MEWS Score Color Red  ?Assess: if the MEWS score is Yellow or Red  ?Were vital signs taken at a resting state? Yes  ?Focused Assessment Change from prior assessment (see assessment flowsheet)  ?Early Detection of Sepsis Score *See Row Information* Low  ?MEWS guidelines implemented *See Row Information* Yes  ?Treat  ?MEWS Interventions Consulted Respiratory Therapy;Escalated (See documentation below);Other (Comment) ?(Dr Marlou Sa at bedside)  ?Pain Scale 0-10  ?Pain Score 0  ?Take Vital Signs  ?Increase Vital Sign Frequency  Red: Q 1hr X 4 then Q 4hr X 4, if remains red, continue Q 4hrs  ?Escalate  ?MEWS: Escalate Red: discuss with charge nurse/RN and provider, consider discussing with RRT  ?Notify: Charge Nurse/RN  ?Name of Charge Nurse/RN Notified Margreta Journey, RN at bedside  ?Date Charge Nurse/RN Notified 07/17/21  ?Time Charge Nurse/RN Notified 0321  ?Notify: Provider  ?Provider Name/Title Dr Marlou Sa  ?Date Provider Notified 07/17/21  ?Time Provider Notified [phone]  ?Method of Notification Page  ?Notification Reason Change in status  ?Provider response At bedside  ?Date of Provider Response 07/17/21  ?Time of Provider Response [phone]  ?Notify: Rapid Response  ?Name of Rapid Response RN Notified Jarrett Soho, RN  ?Date Rapid Response Notified 07/17/21  ?Time Rapid Response Notified 0503  ? ?See progress note ?Transferred to ICU bedside report given to ICU nurse.  ?

## 2021-07-17 NOTE — Significant Event (Signed)
Rapid Response Event Note  ? ?Reason for Call :  ?Respiratory distress with desaturation on BiPAP ? ?Initial Focused Assessment:  ?Patient in obvious distress on my arrival despite BiPAP. He is tachypneic and labored and requires repeated verbal stimulation on my arrival to elicit a response from him, then easily falls back asleep.  ? ?Sats were 71% on 100% BiPAP on my arrival. Diffuse rhonchus throughout all lung fields.  ? ?VS: HR 114, BP 177/102(125), RR 43, Sats 71% ? ?Interventions:  ?IM consulted PCCM ?IV Lasix ? ?Plan of Care:  ?Tx to ICU ? ?Event Summary:  ? ?MD Notified: Farrel Gordon, Georgann Housekeeper, Marchelle Gearing ?Call Time: 1448 ?Arrival Time: 96 ?End Time: 0608 ? ?Monna Fam, RN ?

## 2021-07-17 NOTE — Progress Notes (Signed)
Messaged Dr. Marlou Sa to return to bedside related to pt pt decline and pulling BIPAP off. Pt anxious and oxygen sat in the 60's. Rapid response called. See new orders.  ?

## 2021-07-17 NOTE — Progress Notes (Signed)
Patient transferred to 2M03 from 6E01 with the following belongings:  ? ?1 pair of black [employer] slides ?1 cell phone ?2 pairs of pants ?1 shirt  ?2 pairs of boxers  ?

## 2021-07-17 NOTE — Plan of Care (Signed)

## 2021-07-17 NOTE — H&P (Signed)
? ?NAME:  Zachary Briggs, MRN:  [id number], DOB:  [birth year], LOS: 1 ?ADMISSION DATE:  07/16/2021, CONSULTATION DATE:  07/17/21 ?REFERRING MD:  IMTS, CHIEF COMPLAINT:  Shortness of breath  ? ?History of Present Illness:  ?66 yo man with hx of COPD, shortness of breath and productive cough x 2 days.   ?Admitted 5/9 AM.  Occasional chest pain.   ? Cough with yellow sputum, orthopnea.  ?Uses a walker, not terribly active at baseline.   ?Current long term smoker.   ? ?Was initially on bipap and tolerating well.  ?Trial off bipap early am, to HFNC, did not tolerate well.   ?Placed back on bipap at about 445. Anxiety concurrent with mask.  Sat dropped to 70s. Bipap titrated up to 12 over 10, gradually sat came up to high 90s.   ?Initial abg just with hypoxemia, CO2 36.   ?Started looking slightly better with higher bipap settings.   ?Trial of lasix.   ?Transported to 6E at 1130pm from ED ? ?Pertinent  Medical History  ?COPD ?HTN ?Aortic valve stenosis ?NSVT/PSVT with loop recorder ?Prostate carcinoma ?BPH  ?PTSD ?MDD ? ?Coreg, asa, lipitor, hydrodiuil, nortriptyline, protonix, zoloft, flomax  ? ?Significant Hospital Events: ?Including procedures, antibiotic start and stop dates in addition to other pertinent events   ? ? ?Interim History / Subjective:  ? ? ?Objective   ?Blood pressure 128/70, pulse (!) 114, temperature 97.7 ?F (36.5 ?C), temperature source Axillary, resp. rate (!) 40, height '5\' 6"'$  (1.676 m), weight 83.9 kg, SpO2 92 %. ?   ?FiO2 (%):  [40 %-100 %] 100 %  ?No intake or output data in the 24 hours ending 07/17/21 0551 ?Filed Weights  ? 07/16/21 0749  ?Weight: 83.9 kg  ? ? ?Examination: ?General: Tachypnea, awake speaking but has bipap  ?HENT: NCAT  ?Lungs: Crackles  ?Cardiovascular:Tachycardia ?Abdomen: distended  ?Extremities: n oedema  ?Neuro: awake alert   ?GU: Condom cath  ? ?Resolved Hospital Problem list   ? ? ?Assessment & Plan:  ?Hypoxemic resp failure:  ?CT chest showed "multifocal PNA", cxr and CT  could be c/w CHF.  Reported hx of COPD as well.  ?Already started on CAP drugs.  WBC 16 but procal only .53.  BNP 237.  NO hx of CHF, but last echo was 6629, diastolic dysfunction only.  ?Recheck echo in day.  ?ON my exam, looks like pulmonary edema.  Patient is hypertensive, already urinated out about 700cc since arrived upstairs.   ?Sat up to 90s with good seal and 12/10.   ?On 100% fio2.   ?Trial of lasix, I think may be able to avoid intubation.  Foley.   ?Cont bipap.   ?Received solumedrol 60 then '125mg'$ .  Prednisone daily ordered.  ? ? ?HTN : on coreg norvasc ? ?Depression: Zoloft ? ?BPH; flomax  ? ? ?Best Practice (right click and "Reselect all SmartList Selections" daily)  ? ?Diet/type: NPO ?DVT prophylaxis: systemic dose LMWH ?GI prophylaxis: N/A ?Lines: N/A ?Foley:  N/A ?Code Status:  full code ?Last date of multidisciplinary goals of care discussion '[]'$  ? ?Labs   ?CBC: ?Recent Labs  ?Lab 07/16/21 ?0805 07/16/21 ?0814 07/16/21 ?2003  ?WBC 16.8*  --   --   ?NEUTROABS 14.7*  --   --   ?HGB 13.2 14.3 13.6  ?HCT 38.7* 42.0 40.0  ?MCV 81.1  --   --   ?PLT 203  --   --   ? ? ?Basic Metabolic Panel: ?Recent Labs  ?Lab  07/16/21 ?0805 07/16/21 ?0174 07/16/21 ?0915 07/16/21 ?1612 07/16/21 ?2003  ?NA 135 136  --  136 138  ?K 2.7* 2.7*  --  3.5 3.3*  ?CL 102  --   --  104  --   ?CO2 20*  --   --  18*  --   ?GLUCOSE 162*  --   --  179*  --   ?BUN 13  --   --  13  --   ?CREATININE 1.12  --   --  0.97  --   ?CALCIUM 8.2*  --   --  8.4*  --   ?MG  --   --  1.9  --   --   ? ?GFR: ?Estimated Creatinine Clearance: 77.1 mL/min (by C-G formula based on SCr of 0.97 mg/dL). ?Recent Labs  ?Lab 07/16/21 ?0805 07/16/21 ?0915 07/16/21 ?9449  ?PROCALCITON  --  0.53  --   ?WBC 16.8*  --   --   ?LATICACIDVEN 0.9  --  1.0  ? ? ?Liver Function Tests: ?No results for input(s): AST, ALT, ALKPHOS, BILITOT, PROT, ALBUMIN in the last 168 hours. ?No results for input(s): LIPASE, AMYLASE in the last 168 hours. ?No results for input(s): AMMONIA in  the last 168 hours. ? ?ABG ?   ?Component Value Date/Time  ? PHART 7.46 (H) 07/17/2021 6759  ? PCO2ART 36 07/17/2021 0212  ? PO2ART 71 (L) 07/17/2021 1638  ? HCO3 25.7 07/17/2021 0212  ? TCO2 22 07/16/2021 2003  ? ACIDBASEDEF 2.0 07/16/2021 2003  ? O2SAT 96.7 07/17/2021 0212  ?  ? ?Coagulation Profile: ?Recent Labs  ?Lab 07/16/21 ?0915  ?INR 1.1  ? ? ?Cardiac Enzymes: ?No results for input(s): CKTOTAL, CKMB, CKMBINDEX, TROPONINI in the last 168 hours. ? ?HbA1C: ?Hgb A1c MFr Bld  ?Date/Time Value Ref Range Status  ?07/16/2021 11:49 AM 6.6 (H) 4.8 - 5.6 % Final  ?  Comment:  ?  (NOTE) ?Pre diabetes:          5.7%-6.4% ? ?Diabetes:              >6.4% ? ?Glycemic control for   <7.0% ?adults with diabetes ?  ?12/28/2015 08:35 PM 5.8 (H) 4.8 - 5.6 % Final  ?  Comment:  ?  (NOTE) ?        Pre-diabetes: 5.7 - 6.4 ?        Diabetes: >6.4 ?        Glycemic control for adults with diabetes: <7.0 ?  ? ? ?CBG: ?Recent Labs  ?Lab 07/16/21 ?1612 07/16/21 ?2146 07/17/21 ?0139  ?GLUCAP 195* 220* 180*  ? ? ?Review of Systems:   ?Unable  ? ?Past Medical History:  ?He,  has a past medical history of Aortic atherosclerosis ([place]) (12/29/2015), Aortic stenosis (12/30/2015), Back pain, [place] of lung ([place]) (12/29/2015), Heart murmur, Heart valve disease, Hepatitis C, Hypertension, Prediabetes (12/29/2015), Prostate cancer ([place]), and Tobacco abuse.  ? ?Surgical History:  ? ?Past Surgical History:  ?Procedure Laterality Date  ? BACK SURGERY    ?  ? ?Social History:  ? reports that he has been smoking cigars. He has never used smokeless tobacco. He reports current alcohol use of about 4.0 standard drinks per week. He reports current drug use. Drug: Marijuana.  ? ?Family History:  ?His family history includes Cancer in his father, mother, and sister; Diabetes Mellitus II in his brother.  ? ?Allergies ?No Known Allergies  ? ?Home Medications  ?Prior to Admission medications   ?Medication Sig  Start Date End Date Taking? Authorizing Provider   ?acetaminophen (TYLENOL) 325 MG tablet Take 650 mg by mouth every 4 (four) hours as needed for mild pain or fever.   Yes [provider]  ?amLODipine (NORVASC) 10 MG tablet Take 10 mg by mouth daily.   Yes [provider]  ?atorvastatin (LIPITOR) 20 MG tablet Take 20 mg by mouth daily.    Yes [provider]  ?carvedilol (COREG) 6.25 MG tablet Take 1 tablet (6.25 mg total) by mouth 2 (two) times daily with a meal. 04/27/19  Yes Dorie Rank, MD  ?omeprazole (PRILOSEC) 20 MG capsule Take 20 mg by mouth daily.   Yes [provider]  ?pregabalin (LYRICA) 200 MG capsule Take 200 mg by mouth 2 (two) times daily.   Yes [provider]  ?sertraline (ZOLOFT) 100 MG tablet Take 1.5 tablets (150 mg total) by mouth daily. 04/27/19  Yes Dorie Rank, MD  ?tamsulosin (FLOMAX) 0.4 MG CAPS capsule Take 0.4 mg by mouth at bedtime.   Yes [provider]  ?valsartan (DIOVAN) 80 MG tablet Take 80 mg by mouth in the morning, at noon, and at bedtime.   Yes [provider]  ?aspirin EC 81 MG tablet Take 81 mg by mouth daily. ?Patient not taking: Reported on 07/16/2021    [provider]  ?docusate sodium (COLACE) 50 MG capsule Take 50 mg by mouth 2 (two) times daily as needed for mild constipation. ?Patient not taking: Reported on 07/16/2021    [provider]  ?hydrochlorothiazide (HYDRODIURIL) 25 MG tablet Take 1 tablet (25 mg total) by mouth daily. ?Patient not taking: Reported on 07/16/2021 04/27/19   Dorie Rank, MD  ?HYDROcodone-acetaminophen (NORCO/VICODIN) 5-325 MG tablet Take 1 tablet by mouth every 4 (four) hours as needed for pain. ?Patient not taking: Reported on 07/16/2021 04/18/21   [provider]  ?nortriptyline (PAMELOR) 10 MG capsule Take 2 capsules (20 mg total) by mouth at bedtime. ?Patient not taking: Reported on 07/16/2021 04/27/19   Dorie Rank, MD  ?pantoprazole (PROTONIX) 20 MG tablet Take 1 tablet (20 mg total) by mouth 2 (two) times  daily. ?Patient not taking: Reported on 04/27/2019 05/16/17   Isla Pence, MD  ?  ? ?Critical care time: 75 min ?  ? ? ? ? ? ?

## 2021-07-17 NOTE — Progress Notes (Signed)
Doing well on HHFNC all day with precedex. Saturations remaining in the 90s with RR in 76s. ? ?Keep NPO (sips & chips ok) overnight, reassess in AM. ?BiPAP overnight ? ? ?[name], DO 07/17/21 6:03 PM ?[facility] ? ?

## 2021-07-17 NOTE — Progress Notes (Signed)
Patient taken off Bipap.  Patient kept threatening that he was going to take mask off, MD called by RN and RN called this RT.  Placed patient on [place] at 35L at 100 and a NRB mask (15L).   Sat is varying at times,but when it has a good pleth, sat is 88% or greater.  Will continue to monitor. ?

## 2021-07-17 NOTE — IPAL (Signed)
?  Interdisciplinary Goals of Care Family Meeting ? ? ?Date carried out:: 07/17/2021 ? ?Location of the meeting: Bedside ? ?Member's involved: Physician, Bedside Registered Nurse, and Family Member or next of kin ? ?Durable Power of Tour manager: Patient making his own decisions. Also present: wife, daughter and another family member present, multiple family members on speaker phone.   ? ?Discussion: We discussed goals of care for Zachary Briggs .  He understands he has acute and chronic respiratory disease from chronic tobacco use and likely has acute pneumonia. He understands that bipap and HHFNC may not be enough to support him and he is at high risk for progressing to needing intubation. Although he was resistant to the idea of intubation, he would be ok with that if it was the only option. He wants to be full code. Family agrees with his decisions. All questions answered.  ? ?Code status: Full Code ? ?Disposition: Continue current acute care ? ? ?Time spent for the meeting: 15 min. ? ?[name] ?07/17/2021, 8:41 AM ? ?

## 2021-07-17 NOTE — Progress Notes (Signed)
? ?NAME:  Zachary Briggs, MRN:  [id number], DOB:  April 18, 1955, LOS: 1 ?ADMISSION DATE:  07/16/2021, CONSULTATION DATE:  07/17/21 ?REFERRING MD:  IMTS, CHIEF COMPLAINT:  Shortness of breath  ? ?History of Present Illness:  ?66 yo man with hx of COPD, shortness of breath and productive cough x 2 days.   ?Admitted 5/9 AM.  Occasional chest pain.   ? Cough with yellow sputum, orthopnea.  ?Uses a walker, not terribly active at baseline.   ?Current long term smoker.   ? ?Was initially on bipap and tolerating well.  ?Trial off bipap early am, to HFNC, did not tolerate well.   ?Placed back on bipap at about 445. Anxiety concurrent with mask.  Sat dropped to 70s. Bipap titrated up to 12 over 10, gradually sat came up to high 90s.   ?Initial abg just with hypoxemia, CO2 36.   ?Started looking slightly better with higher bipap settings.   ?Trial of lasix.   ?Transported to 6E at 1130pm from ED ? ?Pertinent  Medical History  ?COPD ?HTN ?Aortic valve stenosis ?NSVT/PSVT with loop recorder ?Prostate carcinoma ?BPH  ?PTSD ?MDD ? ?Coreg, asa, lipitor, hydrodiuil, nortriptyline, protonix, zoloft, flomax  ? ?Significant Hospital Events: ?Including procedures, antibiotic start and stop dates in addition to other pertinent events   ? ? ?Interim History / Subjective:  ?Overnight had worsening respiratory distress and was transferred to ICU. ? ?Objective   ?Blood pressure (!) 158/93, pulse (!) 104, temperature (!) 100.6 ?F (38.1 ?C), temperature source Axillary, resp. rate (!) 26, height '5\' 6"'$  (1.676 m), weight 88.7 kg, SpO2 95 %. ?   ?Vent Mode: PCV;CPAP ?FiO2 (%):  [40 %-100 %] 100 % ?Set Rate:  [14 bmp] 14 bmp ?PEEP:  [10 cmH20] 10 cmH20  ? ?Intake/Output Summary (Last 24 hours) at 07/17/2021 2979 ?Last data filed at 07/17/2021 [phone] ?Gross per 24 hour  ?Intake 193.94 ml  ?Output 1225 ml  ?Net -1031.06 ml  ? ?Filed Weights  ? 07/16/21 0749 07/17/21 0601  ?Weight: 83.9 kg 88.7 kg  ? ?Examination: ?General: Resting in bed in mild distress on  BiPAP ?HENT: Normocephalic, atraumatic, dry mucous membranes ?Lungs: Tachypnea. No use accessory muscles. Clear to auscultation anterior lung fields. ?Cardiovascular: Regular rate, rhythm. No murmurs appreciated. ?Abdomen: Soft, non-tender, non-distended. Normoactive bowel sounds. ?Extremities: Warm, dry. No peripheral edema appreciated. ?Neuro: Awake, alert, conversing appropriately. Grossly non-focal. ? ?Assessment & Plan:  ?#Acute hypoxic respiratory failure ?#Multi-focal community acquired pneumonia ?Presentation most consistent with infectious process with low-grade fevers, leukocytosis. Overnight hemodynamically stable, no pressor requirements. No wheezing appreciated, does not appear hypervolemic on exam. Cultures negative thus far. This morning, trial off of BiPAP. If he does not tolerate this, will need to pursue intubation. With MRSA swab positive will expand antibiotic coverage. Patient is full code.  ?- Azithromycin, ceftriaxone, add linezolid ?- Solumedrol '60mg'$  every 12 hours ?- Trial off BiPAP, escalate to intubation if needed ?- Follow-up cultures ? ?#Type II diabetes mellitus ?A1c 6.6%. Sugars mildly elevated, likely due to high-dose steroids. Escalated SSI to resistant today. Will consider adding long-acting if sugars remain uncontrolled. ?- SSI  ? ?#Hypertension ?BP stable on home medications. ? ?#Hypokalemia ?Goal K>4, Mg>2, replete as needed ? ?Best Practice (right click and "Reselect all SmartList Selections" daily)  ? ?Diet/type: NPO ?DVT prophylaxis: LMWH ?GI prophylaxis: N/A ?Lines: N/A ?Foley:  N/A ?Code Status:  full code ?Last date of multidisciplinary goals of care discussion '[]'$  ? ?Labs   ?CBC: ?Recent Labs  ?Lab  07/16/21 ?0805 07/16/21 ?4196 07/16/21 ?2003  ?WBC 16.8*  --   --   ?NEUTROABS 14.7*  --   --   ?HGB 13.2 14.3 13.6  ?HCT 38.7* 42.0 40.0  ?MCV 81.1  --   --   ?PLT 203  --   --   ? ? ? ?Basic Metabolic Panel: ?Recent Labs  ?Lab 07/16/21 ?0805 07/16/21 ?2229 07/16/21 ?0915  07/16/21 ?1612 07/16/21 ?2003  ?NA 135 136  --  136 138  ?K 2.7* 2.7*  --  3.5 3.3*  ?CL 102  --   --  104  --   ?CO2 20*  --   --  18*  --   ?GLUCOSE 162*  --   --  179*  --   ?BUN 13  --   --  13  --   ?CREATININE 1.12  --   --  0.97  --   ?CALCIUM 8.2*  --   --  8.4*  --   ?MG  --   --  1.9  --   --   ? ? ?GFR: ?Estimated Creatinine Clearance: 79.3 mL/min (by C-G formula based on SCr of 0.97 mg/dL). ?Recent Labs  ?Lab 07/16/21 ?0805 07/16/21 ?0915 07/16/21 ?7989  ?PROCALCITON  --  0.53  --   ?WBC 16.8*  --   --   ?LATICACIDVEN 0.9  --  1.0  ? ? ? ?Liver Function Tests: ?No results for input(s): AST, ALT, ALKPHOS, BILITOT, PROT, ALBUMIN in the last 168 hours. ?No results for input(s): LIPASE, AMYLASE in the last 168 hours. ?No results for input(s): AMMONIA in the last 168 hours. ? ?ABG ?   ?Component Value Date/Time  ? PHART 7.46 (H) 07/17/2021 2119  ? PCO2ART 36 07/17/2021 0212  ? PO2ART 71 (L) 07/17/2021 4174  ? HCO3 25.7 07/17/2021 0212  ? TCO2 22 07/16/2021 2003  ? ACIDBASEDEF 2.0 07/16/2021 2003  ? O2SAT 96.7 07/17/2021 0212  ? ?  ? ?Coagulation Profile: ?Recent Labs  ?Lab 07/16/21 ?0915  ?INR 1.1  ? ? ? ?Cardiac Enzymes: ?No results for input(s): CKTOTAL, CKMB, CKMBINDEX, TROPONINI in the last 168 hours. ? ?HbA1C: ?Hgb A1c MFr Bld  ?Date/Time Value Ref Range Status  ?07/16/2021 11:49 AM 6.6 (H) 4.8 - 5.6 % Final  ?  Comment:  ?  (NOTE) ?Pre diabetes:          5.7%-6.4% ? ?Diabetes:              >6.4% ? ?Glycemic control for   <7.0% ?adults with diabetes ?  ?12/28/2015 08:35 PM 5.8 (H) 4.8 - 5.6 % Final  ?  Comment:  ?  (NOTE) ?        Pre-diabetes: 5.7 - 6.4 ?        Diabetes: >6.4 ?        Glycemic control for adults with diabetes: <7.0 ?  ? ? ?CBG: ?Recent Labs  ?Lab 07/16/21 ?1612 07/16/21 ?2146 07/17/21 ?0139 07/17/21 ?0814  ?GLUCAP 195* 220* 180* 208*  ? ? ?Critical care time: 45 min ?  ? ?Sanjuan Dame, MD ?Internal Medicine PGY-2 ?Pager: [phone] ? ? ? ? ?

## 2021-07-17 NOTE — Progress Notes (Signed)
Transported patient from 6E01 to 69M03 without incident.  69M RT at bedside upon arrival to ICU. ?

## 2021-07-17 NOTE — Progress Notes (Signed)
eLink Physician-Brief Progress Note ?Patient Name: Zachary Briggs ?DOB: April 25, 1955 ?MRN: [id number] ? ? ?Date of Service ? 07/17/2021  ?HPI/Events of Note ? Patient admitted with acute hypoxemic respiratory failure secondary to bilateral multi-focal pneumonia.  ?eICU Interventions ? New Patient Evaluation completed.  ? ? ? ?  ? ?Frederik Pear ?07/17/2021, 6:25 AM ?

## 2021-07-17 NOTE — Progress Notes (Signed)
RT NOTE: RT took patient off of bipap in order to change mask size from a medium to a large.  During the time that patient was off bipap, patient's work of breathing increased, patient started doing pursed lip breathing, and was ready for the mask to go back on.  Patient placed back on bipap and is tolerating well at this time.  Will continue to monitor.  ?

## 2021-07-17 NOTE — Progress Notes (Signed)
eLink Physician-Brief Progress Note ?Patient Name: Zachary Briggs ?DOB: August 13, 1955 ?MRN: [id number] ? ? ?Date of Service ? 07/17/2021  ?HPI/Events of Note ? Glucose uptrending  ?eICU Interventions ? Increased frequency of accuchecks and sliding scale insulin to Q4 hours.   ? ? ? ?  ? ?[place] ?07/17/2021, 10:26 PM ?

## 2021-07-17 NOTE — Progress Notes (Signed)
Had to put patient back on Bipap.  RR increased with WOB and HR.  Sats dropped into lower 80s and 70s.   ?

## 2021-07-18 ENCOUNTER — Inpatient Hospital Stay (HOSPITAL_COMMUNITY): Payer: Medicare Other

## 2021-07-18 DIAGNOSIS — Z9911 Dependence on respirator [ventilator] status: Secondary | ICD-10-CM | POA: Diagnosis not present

## 2021-07-18 DIAGNOSIS — J9601 Acute respiratory failure with hypoxia: Secondary | ICD-10-CM | POA: Diagnosis not present

## 2021-07-18 DIAGNOSIS — J80 Acute respiratory distress syndrome: Secondary | ICD-10-CM

## 2021-07-18 LAB — BODY FLUID CELL COUNT WITH DIFFERENTIAL
Eos, Fluid: 0 %
Lymphs, Fluid: 11 %
Monocyte-Macrophage-Serous Fluid: 10 % — ABNORMAL LOW (ref 50–90)
Neutrophil Count, Fluid: 79 % — ABNORMAL HIGH (ref 0–25)
Total Nucleated Cell Count, Fluid: 110 cu mm (ref 0–1000)

## 2021-07-18 LAB — KOH PREP: KOH Prep: NONE SEEN

## 2021-07-18 LAB — POCT I-STAT 7, (LYTES, BLD GAS, ICA,H+H)
Acid-base deficit: 2 mmol/L (ref 0.0–2.0)
Bicarbonate: 28.4 mmol/L — ABNORMAL HIGH (ref 20.0–28.0)
Calcium, Ion: 1.14 mmol/L — ABNORMAL LOW (ref 1.15–1.40)
HCT: 37 % — ABNORMAL LOW (ref 39.0–52.0)
Hemoglobin: 12.6 g/dL — ABNORMAL LOW (ref 13.0–17.0)
O2 Saturation: 100 %
Patient temperature: 96.3
Potassium: 4.2 mmol/L (ref 3.5–5.1)
Sodium: 138 mmol/L (ref 135–145)
TCO2: 31 mmol/L (ref 22–32)
pCO2 arterial: 73.7 mmHg (ref 32–48)
pH, Arterial: 7.187 — CL (ref 7.35–7.45)
pO2, Arterial: 261 mmHg — ABNORMAL HIGH (ref 83–108)

## 2021-07-18 LAB — CBC
HCT: 35.1 % — ABNORMAL LOW (ref 39.0–52.0)
Hemoglobin: 11.8 g/dL — ABNORMAL LOW (ref 13.0–17.0)
MCH: 27 pg (ref 26.0–34.0)
MCHC: 33.6 g/dL (ref 30.0–36.0)
MCV: 80.3 fL (ref 80.0–100.0)
Platelets: 236 10*3/uL (ref 150–400)
RBC: 4.37 MIL/uL (ref 4.22–5.81)
RDW: 14.6 % (ref 11.5–15.5)
WBC: 18.6 10*3/uL — ABNORMAL HIGH (ref 4.0–10.5)
nRBC: 0 % (ref 0.0–0.2)

## 2021-07-18 LAB — BLOOD GAS, ARTERIAL
Acid-base deficit: 2.1 mmol/L — ABNORMAL HIGH (ref 0.0–2.0)
Bicarbonate: 26 mmol/L (ref 20.0–28.0)
Drawn by: 39899
O2 Saturation: 100 %
Patient temperature: 35.7
pCO2 arterial: 55 mmHg — ABNORMAL HIGH (ref 32–48)
pH, Arterial: 7.28 — ABNORMAL LOW (ref 7.35–7.45)
pO2, Arterial: 133 mmHg — ABNORMAL HIGH (ref 83–108)

## 2021-07-18 LAB — BASIC METABOLIC PANEL
Anion gap: 14 (ref 5–15)
BUN: 25 mg/dL — ABNORMAL HIGH (ref 8–23)
CO2: 21 mmol/L — ABNORMAL LOW (ref 22–32)
Calcium: 8.4 mg/dL — ABNORMAL LOW (ref 8.9–10.3)
Chloride: 101 mmol/L (ref 98–111)
Creatinine, Ser: 0.92 mg/dL (ref 0.61–1.24)
GFR, Estimated: >60 mL/min (ref >60)
Glucose, Bld: 176 mg/dL — ABNORMAL HIGH (ref 70–99)
Potassium: 3.9 mmol/L (ref 3.5–5.1)
Sodium: 136 mmol/L (ref 135–145)

## 2021-07-18 LAB — GLUCOSE, CAPILLARY
Glucose-Capillary: 161 mg/dL — ABNORMAL HIGH (ref 70–99)
Glucose-Capillary: 150 mg/dL — ABNORMAL HIGH (ref 70–99)
Glucose-Capillary: 218 mg/dL — ABNORMAL HIGH (ref 70–99)
Glucose-Capillary: 182 mg/dL — ABNORMAL HIGH (ref 70–99)
Glucose-Capillary: 180 mg/dL — ABNORMAL HIGH (ref 70–99)
Glucose-Capillary: 226 mg/dL — ABNORMAL HIGH (ref 70–99)

## 2021-07-18 LAB — LEGIONELLA PNEUMOPHILA SEROGP 1 UR AG: L. pneumophila Serogp 1 Ur Ag: NEGATIVE

## 2021-07-18 LAB — MAGNESIUM: Magnesium: 2.4 mg/dL (ref 1.7–2.4)

## 2021-07-18 MED ORDER — ROCURONIUM BROMIDE 10 MG/ML (PF) SYRINGE
PREFILLED_SYRINGE | INTRAVENOUS | Status: AC
  Administered 2021-07-18: 86.2 mg via INTRAVENOUS
  Filled 2021-07-18: qty 10

## 2021-07-18 MED ORDER — IPRATROPIUM-ALBUTEROL 0.5-2.5 (3) MG/3ML IN SOLN
3.0000 mL | RESPIRATORY_TRACT | Status: DC
Start: 2021-07-18 — End: 2021-07-24
  Administered 2021-07-18 – 2021-07-24 (×37): 3 mL via RESPIRATORY_TRACT
  Filled 2021-07-18 (×37): qty 3

## 2021-07-18 MED ORDER — ETOMIDATE 2 MG/ML IV SOLN: 20.0000 mg | Freq: Once | INTRAVENOUS | Status: AC

## 2021-07-18 MED ORDER — MIDAZOLAM HCL 2 MG/2ML IJ SOLN
INTRAMUSCULAR | Status: AC
  Administered 2021-07-18: 4 mg via INTRAVENOUS
  Filled 2021-07-18: qty 4

## 2021-07-18 MED ORDER — PANTOPRAZOLE SODIUM 40 MG IV SOLR
40.0000 mg | Freq: Two times a day (BID) | INTRAVENOUS | Status: DC
  Administered 2021-07-18 – 2021-07-22 (×9): 40 mg via INTRAVENOUS
  Filled 2021-07-18 (×9): qty 10

## 2021-07-18 MED ORDER — ARTIFICIAL TEARS OPHTHALMIC OINT
1.0000 "application " | TOPICAL_OINTMENT | Freq: Three times a day (TID) | OPHTHALMIC | Status: DC
  Administered 2021-07-18 – 2021-07-23 (×11): 1 via OPHTHALMIC
  Filled 2021-07-18 (×2): qty 3.5

## 2021-07-18 MED ORDER — POLYETHYLENE GLYCOL 3350 17 G PO PACK
17.0000 g | PACK | Freq: Every day | ORAL | Status: DC
  Administered 2021-07-18 – 2021-07-27 (×8): 17 g
  Filled 2021-07-18 (×7): qty 1

## 2021-07-18 MED ORDER — MIDAZOLAM HCL 2 MG/2ML IJ SOLN: 1.0000 mg | INTRAMUSCULAR | Status: DC | PRN

## 2021-07-18 MED ORDER — ORAL CARE MOUTH RINSE
15.0000 mL | OROMUCOSAL | Status: DC
  Administered 2021-07-19 – 2021-07-28 (×91): 15 mL via OROMUCOSAL

## 2021-07-18 MED ORDER — MIDAZOLAM HCL 2 MG/2ML IJ SOLN
1.0000 mg | INTRAMUSCULAR | Status: DC | PRN
  Administered 2021-07-18: 1 mg via INTRAVENOUS
  Filled 2021-07-18: qty 2

## 2021-07-18 MED ORDER — MIDAZOLAM BOLUS VIA INFUSION
0.0000 mg | INTRAVENOUS | Status: DC | PRN
  Administered 2021-07-20: 2 mg via INTRAVENOUS
  Administered 2021-07-20: 1 mg via INTRAVENOUS
  Administered 2021-07-21 (×3): 2 mg via INTRAVENOUS
  Administered 2021-07-22 (×2): 3 mg via INTRAVENOUS
  Filled 2021-07-18: qty 5

## 2021-07-18 MED ORDER — SODIUM CHLORIDE 0.9 % IV SOLN: INTRAVENOUS | Status: DC | PRN

## 2021-07-18 MED ORDER — FENTANYL CITRATE (PF) 100 MCG/2ML IJ SOLN: 100.0000 ug | Freq: Once | INTRAMUSCULAR | Status: AC

## 2021-07-18 MED ORDER — INSULIN DETEMIR 100 UNIT/ML ~~LOC~~ SOLN
10.0000 [IU] | Freq: Two times a day (BID) | SUBCUTANEOUS | Status: DC
  Administered 2021-07-18 – 2021-07-21 (×7): 10 [IU] via SUBCUTANEOUS
  Filled 2021-07-18 (×9): qty 0.1

## 2021-07-18 MED ORDER — ROCURONIUM BROMIDE 10 MG/ML (PF) SYRINGE
PREFILLED_SYRINGE | INTRAVENOUS | Status: AC
  Administered 2021-07-18: 100 mg via INTRAVENOUS
  Filled 2021-07-18: qty 10

## 2021-07-18 MED ORDER — FENTANYL CITRATE (PF) 100 MCG/2ML IJ SOLN: 25.0000 ug | Freq: Once | INTRAMUSCULAR | Status: AC

## 2021-07-18 MED ORDER — FENTANYL BOLUS VIA INFUSION
25.0000 ug | INTRAVENOUS | Status: DC | PRN
  Administered 2021-07-18 – 2021-07-19 (×3): 100 ug via INTRAVENOUS
  Administered 2021-07-19: 50 ug via INTRAVENOUS
  Administered 2021-07-20 (×2): 75 ug via INTRAVENOUS
  Administered 2021-07-20 – 2021-07-22 (×7): 100 ug via INTRAVENOUS
  Administered 2021-07-22: 50 ug via INTRAVENOUS
  Administered 2021-07-22: 100 ug via INTRAVENOUS
  Administered 2021-07-22: 50 ug via INTRAVENOUS
  Administered 2021-07-22: 100 ug via INTRAVENOUS
  Administered 2021-07-22: 50 ug via INTRAVENOUS
  Administered 2021-07-22 – 2021-07-24 (×14): 100 ug via INTRAVENOUS
  Administered 2021-07-25: 75 ug via INTRAVENOUS
  Administered 2021-07-25 (×2): 100 ug via INTRAVENOUS
  Administered 2021-07-25: 75 ug via INTRAVENOUS
  Administered 2021-07-25: 100 ug via INTRAVENOUS
  Filled 2021-07-18: qty 100

## 2021-07-18 MED ORDER — PROPOFOL 1000 MG/100ML IV EMUL
0.0000 ug/kg/min | INTRAVENOUS | Status: DC
  Administered 2021-07-18: 20 ug/kg/min via INTRAVENOUS
  Administered 2021-07-18 – 2021-07-19 (×3): 30 ug/kg/min via INTRAVENOUS
  Filled 2021-07-18 (×4): qty 100

## 2021-07-18 MED ORDER — VITAL 1.5 CAL PO LIQD
1000.0000 mL | ORAL | Status: DC
Start: 2021-07-18 — End: 2021-07-18

## 2021-07-18 MED ORDER — FENTANYL 2500MCG IN NS 250ML (10MCG/ML) PREMIX INFUSION
0.0000 ug/h | INTRAVENOUS | Status: DC
  Administered 2021-07-18: 100 ug/h via INTRAVENOUS
  Administered 2021-07-19: 175 ug/h via INTRAVENOUS
  Administered 2021-07-19 (×2): 100 ug/h via INTRAVENOUS
  Administered 2021-07-20 – 2021-07-21 (×2): 200 ug/h via INTRAVENOUS
  Administered 2021-07-21: 300 ug/h via INTRAVENOUS
  Administered 2021-07-22: 250 ug/h via INTRAVENOUS
  Administered 2021-07-22 – 2021-07-23 (×4): 300 ug/h via INTRAVENOUS
  Administered 2021-07-23: 275 ug/h via INTRAVENOUS
  Administered 2021-07-24 – 2021-07-25 (×4): 300 ug/h via INTRAVENOUS
  Administered 2021-07-25: 75 ug/h via INTRAVENOUS
  Filled 2021-07-18 (×18): qty 250

## 2021-07-18 MED ORDER — MIDAZOLAM-SODIUM CHLORIDE 100-0.9 MG/100ML-% IV SOLN
0.0000 mg/h | INTRAVENOUS | Status: DC
  Administered 2021-07-19: 4 mg/h via INTRAVENOUS
  Administered 2021-07-20: 8 mg/h via INTRAVENOUS
  Administered 2021-07-20: 5 mg/h via INTRAVENOUS
  Administered 2021-07-21: 7 mg/h via INTRAVENOUS
  Administered 2021-07-22: 10 mg/h via INTRAVENOUS
  Filled 2021-07-18 (×5): qty 100

## 2021-07-18 MED ORDER — CHLORHEXIDINE GLUCONATE 0.12% ORAL RINSE (MEDLINE KIT)
15.0000 mL | Freq: Two times a day (BID) | OROMUCOSAL | Status: DC
  Administered 2021-07-19 – 2021-07-27 (×19): 15 mL via OROMUCOSAL

## 2021-07-18 MED ORDER — NOREPINEPHRINE 4 MG/250ML-% IV SOLN
INTRAVENOUS | Status: AC
  Administered 2021-07-18: 5 ug/min via INTRAVENOUS
  Filled 2021-07-18: qty 250

## 2021-07-18 MED ORDER — NOREPINEPHRINE 4 MG/250ML-% IV SOLN: 2.0000 ug/min | INTRAVENOUS | Status: DC

## 2021-07-18 MED ORDER — VITAL 1.5 CAL PO LIQD
1000.0000 mL | ORAL | Status: DC
  Administered 2021-07-18 – 2021-07-19 (×2): 1000 mL

## 2021-07-18 MED ORDER — SODIUM CHLORIDE 0.9 % IV SOLN
250.0000 mL | INTRAVENOUS | Status: DC
Start: 2021-07-18 — End: 2021-07-31
  Administered 2021-07-18 – 2021-07-19 (×2): 250 mL via INTRAVENOUS

## 2021-07-18 MED ORDER — MIDAZOLAM HCL 2 MG/2ML IJ SOLN: 4.0000 mg | Freq: Once | INTRAMUSCULAR | Status: AC

## 2021-07-18 MED ORDER — VECURONIUM BROMIDE 10 MG IV SOLR
0.0000 ug/kg/min | Status: DC
  Administered 2021-07-18: 1 ug/kg/min via INTRAVENOUS
  Administered 2021-07-19: 0.5 ug/kg/min via INTRAVENOUS
  Filled 2021-07-18 (×2): qty 100

## 2021-07-18 MED ORDER — ETOMIDATE 2 MG/ML IV SOLN
INTRAVENOUS | Status: AC
  Administered 2021-07-18: 20 mg via INTRAVENOUS
  Filled 2021-07-18: qty 10

## 2021-07-18 MED ORDER — FUROSEMIDE 10 MG/ML IJ SOLN
20.0000 mg | Freq: Two times a day (BID) | INTRAMUSCULAR | Status: AC
  Administered 2021-07-18 – 2021-07-19 (×2): 20 mg via INTRAVENOUS
  Filled 2021-07-18 (×2): qty 2

## 2021-07-18 MED ORDER — ROCURONIUM BROMIDE 50 MG/5ML IV SOLN
100.0000 mg | Freq: Once | INTRAVENOUS | Status: AC
  Filled 2021-07-18: qty 10

## 2021-07-18 MED ORDER — POLYETHYLENE GLYCOL 3350 17 G PO PACK
17.0000 g | PACK | Freq: Every day | ORAL | Status: DC
Start: 2021-07-18 — End: 2021-07-20
  Filled 2021-07-18: qty 1

## 2021-07-18 MED ORDER — DOCUSATE SODIUM 50 MG/5ML PO LIQD
100.0000 mg | Freq: Two times a day (BID) | ORAL | Status: DC
  Administered 2021-07-18 – 2021-07-27 (×14): 100 mg
  Filled 2021-07-18 (×15): qty 10

## 2021-07-18 MED ORDER — FENTANYL CITRATE (PF) 100 MCG/2ML IJ SOLN
INTRAMUSCULAR | Status: AC
  Administered 2021-07-18: 100 ug via INTRAVENOUS
  Filled 2021-07-18: qty 2

## 2021-07-18 MED ORDER — VECURONIUM BOLUS VIA INFUSION
0.0800 mg/kg | Freq: Once | INTRAVENOUS | Status: AC
  Administered 2021-07-18: 6.9 mg via INTRAVENOUS
  Filled 2021-07-18: qty 7

## 2021-07-18 MED ORDER — ROCURONIUM BROMIDE 10 MG/ML (PF) SYRINGE: 1.0000 mg/kg | PREFILLED_SYRINGE | Freq: Once | INTRAVENOUS | Status: AC

## 2021-07-18 NOTE — Procedures (Signed)
Arterial Catheter Insertion Procedure Note ? ?Zachary Briggs  ?[id number]  ?June 07, 1955 ? ?Date:07/18/21  ?Time:11:58 AM  ? ? ?Provider Performing: Judith Part  ? ? ?Procedure: Insertion of Arterial Line [phone]) without US guidance ? ?Indication(s) ?Blood pressure monitoring and/or need for frequent ABGs ? ?Consent ?Risks of the procedure as well as the alternatives and risks of each were explained to the patient and/or caregiver.  Consent for the procedure was obtained and is signed in the bedside chart ? ?Anesthesia ?None ? ? ?Time Out ?Verified patient identification, verified procedure, site/side was marked, verified correct patient position, special equipment/implants available, medications/allergies/relevant history reviewed, required imaging and test results available. ? ? ?Sterile Technique ?Maximal sterile technique including full sterile barrier drape, hand hygiene, sterile gown, sterile gloves, mask, hair covering, sterile ultrasound probe cover (if used). ? ? ?Procedure Description ?Area of catheter insertion was cleaned with chlorhexidine and draped in sterile fashion. Without real-time ultrasound guidance an arterial catheter was placed into the left radial artery.  Appropriate arterial tracings confirmed on monitor.   ? ? ?Complications/Tolerance ?None; patient tolerated the procedure well. ? ? ?EBL ?Minimal ? ? ?Specimen(s) ?None ? ?

## 2021-07-18 NOTE — Progress Notes (Signed)
An USGPIV (ultrasound guided PIV) has been placed for short-term vasopressor infusion. A correctly placed ivWatch must be used when administering Vasopressors. Should this treatment be needed beyond 72 hours, central line access should be obtained.  It will be the responsibility of the bedside nurse to follow best practice to prevent extravasations.  RN made aware to remove other PIV sites. ?

## 2021-07-18 NOTE — Procedures (Signed)
Bronchoscopy Procedure Note ? ?Zachary Briggs  ?[id number]  ?1955/08/06 ? ?Date:07/18/21  ?Time:2:42 PM  ? ?Provider Performing:Mounir Skipper Naomie Dean  ? ?Procedure(s):  Flexible bronchoscopy with bronchial alveolar lavage (01779) ? ?Indication(s) ?ARDS of unknown etiology, acute anemia ? ?Consent ?Risks of the procedure as well as the alternatives and risks of each were explained to the patient and/or caregiver.  Consent for the procedure was obtained and is signed in the bedside chart- consent provided by wife ? ?Anesthesia ?Per MAR ? ? ?Time Out ?Verified patient identification, verified procedure, site/side was marked, verified correct patient position, special equipment/implants available, medications/allergies/relevant history reviewed, required imaging and test results available. ? ? ?Sterile Technique ?Usual hand hygiene, masks, gowns, and gloves were used ? ? ?Procedure Description ?Bronchoscope advanced through endotracheal tube and into airway.  Airways were examined down to subsegmental level with findings noted below.   ?Following diagnostic evaluation, sequential BAL(s) performed in RML with normal saline and return of 30cc fluid ? ?Findings: Thick secretions, somewhat purulent. Minimally bloody BAL samples ? ? ? ? ? ?Complications/Tolerance ?None; patient tolerated the procedure well. ?Chest X-ray is not needed post procedure. ? ? ?EBL ?Minimal ? ? ?Specimen(s) ?Culture, cell count with diff, fungal culture & KOH prep ? ?[name], DO 07/18/21 2:45 PM ?[facility] ? ? ?

## 2021-07-18 NOTE — Progress Notes (Signed)
Inpatient Diabetes Program Recommendations ? ?AACE/ADA: New Consensus Statement on Inpatient Glycemic Control  ? ?Target Ranges:  Prepandial:   less than 140 mg/dL ?     Peak postprandial:   less than 180 mg/dL (1-2 hours) ?     Critically ill patients:  140 - 180 mg/dL  ? ? Latest Reference Range & Units 07/18/21 03:16 07/18/21 07:29  ?Glucose-Capillary 70 - 99 mg/dL 161 (H) 150 (H)  ? ? Latest Reference Range & Units 07/17/21 07:13 07/17/21 11:29 07/17/21 16:14 07/17/21 21:32 07/17/21 23:10  ?Glucose-Capillary 70 - 99 mg/dL 230 (H) 195 (H) 230 (H) 204 (H) 204 (H)  ? ?Review of Glycemic Control ? ? ?Current orders for Inpatient glycemic control: Novolog 0-20 units Q4H; Solumedrol 60 mg Q12H ? ?Inpatient Diabetes Program Recommendations:   ? ?Insulin: If steroids are continued as ordered, please consider ordering Semglee 9 units Q24H (based on 86.2 kg x 0.1 units). ? ?Thanks, ?Barnie Alderman, RN, MSN, CDE ?Diabetes Coordinator ?Inpatient Diabetes Program ?[phone] (Team Pager from 8am to 5pm) ? ?

## 2021-07-18 NOTE — Progress Notes (Signed)
P:F >200 after bronch. ?Down to 70% FiO2, PEEP still 15. Wean PEEP next. ?Pplat 29 ?DP 14 ?Con't NMB, heavy sedation, permissive hypercapnia. ? ?[name], DO 07/18/21 6:37 PM ?[facility] ? ?

## 2021-07-18 NOTE — Progress Notes (Signed)
Patient O2 saturations fluctuating between 86-89% on HHFNC '@40L'$  100%FIO2. ?Encouraged BIPAP however patient refuses. ?Added NRB for oxygenation. Spo2 improved to 97%.  ?

## 2021-07-18 NOTE — Progress Notes (Addendum)
Initial Nutrition Assessment ? ?DOCUMENTATION CODES:  ? ?Not applicable ? ?INTERVENTION:  ? ?Initiate tube feeding via OG tube: ?Vital 1.5 at 20 ml/h [720 kcal (992 kcal total with propofol), 32 gm protein, 367 ml free water daily]. ?If tolerating tube feeding well tomorrow, recommend advance to goal rate of 55 ml/h (1320 ml per day) with Prosource TF 45 ml TID. ? ?At goal rate, TF regimen provides 2100 kcal (2372 kcal total with current propofol), 122 gm protein, 1008 ml free water daily. ? ?NUTRITION DIAGNOSIS:  ? ?Inadequate oral intake related to inability to eat as evidenced by NPO status. ? ?GOAL:  ? ?Patient will meet greater than or equal to 90% of their needs ? ?MONITOR:  ? ?Vent status, TF tolerance, Labs ? ?REASON FOR ASSESSMENT:  ? ?Ventilator, Consult ?Enteral/tube feeding initiation and management ? ?ASSESSMENT:  ? ?66 yo male admitted with SOB, CAP, ARDS. PMH includes COPD, smoker, HTN, aortic valve stenosis, hepatitis C, prostate cancer, BPH, PTSD, MDD. ? ?Discussed patient in ICU rounds and with RN today. ?Patient required intubation this morning. ?Plans to prone patient later today, then start trickle tube feedings via OG tube. ?RD to order trickle TF. Discussed with RN and physician. ?Cortrak has been ordered for tomorrow.  ? ?Patient is currently intubated on ventilator support ?MV: 10.9 L/min ?Temp (24hrs), Avg:97.5 ?F (36.4 ?C), Min:96.3 ?F (35.7 ?C), Max:98 ?F (36.7 ?C) ? ?Propofol: 10.3 ml/hr providing 272 kcal from lipid. ? ?Labs reviewed.  ?CBG: 161-150-218 ? ?Medications reviewed and include Colace, Novolog, Solumedrol, Miralax, fentanyl, propofol. ? ?Weight history reviewed.  ?Most recent weight is from 2 years ago, 77.1 kg. ?Current weight 86.2 kg.  ? ?NUTRITION - FOCUSED PHYSICAL EXAM: ? ?Flowsheet Row Most Recent Value  ?Orbital Region No depletion  ?Upper Arm Region No depletion  ?Thoracic and Lumbar Region No depletion  ?Buccal Region Unable to assess  ?Temple Region Mild depletion   ?Clavicle Bone Region No depletion  ?Clavicle and Acromion Bone Region No depletion  ?Scapular Bone Region No depletion  ?Dorsal Hand No depletion  ?Patellar Region Mild depletion  ?Anterior Thigh Region Mild depletion  ?Posterior Calf Region Mild depletion  ?Edema (RD Assessment) None  ?Hair Reviewed  ?Eyes Unable to assess  ?Mouth Reviewed  ?Skin Reviewed  ?Nails Reviewed  ? ?  ? ? ?Diet Order:   ?Diet Order   ? ?       ?  Diet NPO time specified  Diet effective now       ?  ? ?  ?  ? ?  ? ? ?EDUCATION NEEDS:  ? ?Not appropriate for education at this time ? ?Skin:  Skin Assessment: Reviewed RN Assessment ? ?Last BM:  5/10 type 2 ? ?Height:  ? ?Ht Readings from Last 1 Encounters:  ?07/16/21 '5\' 6"'$  (1.676 m)  ? ? ?Weight:  ? ?Wt Readings from Last 1 Encounters:  ?07/18/21 86.2 kg  ? ? ? ?BMI:  Body mass index is 30.67 kg/m?. ? ?Estimated Nutritional Needs:  ? ?Kcal:  1950-2150 ? ?Protein:  110-130 gm ? ?Fluid:  >/= 2 L ? ? ? ?Zachary Briggs RD, LDN, CNSC ?Please refer to Amion for contact information.                                                       ? ?

## 2021-07-18 NOTE — Progress Notes (Addendum)
? ?NAME:  Zachary Briggs, MRN:  [id number], DOB:  01-Dec-1955, LOS: 2 ?ADMISSION DATE:  07/16/2021, CONSULTATION DATE:  07/17/21 ?REFERRING MD:  IMTS, CHIEF COMPLAINT:  Shortness of breath  ? ?History of Present Illness:  ?66 yo man with hx of COPD, shortness of breath and productive cough x 2 days.   ?Admitted 5/9 AM.  Occasional chest pain.   ? Cough with yellow sputum, orthopnea.  ?Uses a walker, not terribly active at baseline.   ?Current long term smoker.   ? ?Was initially on bipap and tolerating well.  ?Trial off bipap early am, to HFNC, did not tolerate well.   ?Placed back on bipap at about 445. Anxiety concurrent with mask.  Sat dropped to 70s. Bipap titrated up to 12 over 10, gradually sat came up to high 90s.   ?Initial abg just with hypoxemia, CO2 36.   ?Started looking slightly better with higher bipap settings.   ?Trial of lasix.   ?Transported to 6E at 1130pm from ED ? ?Pertinent  Medical History  ?COPD ?HTN ?Aortic valve stenosis ?NSVT/PSVT with loop recorder ?Prostate carcinoma ?BPH  ?PTSD ?MDD ? ?Coreg, asa, lipitor, hydrodiuil, nortriptyline, protonix, zoloft, flomax  ? ?Significant Hospital Events: ?Including procedures, antibiotic start and stop dates in addition to other pertinent events   ? ? ?Interim History / Subjective:  ?No acute events overnight. This morning feels okay, ready to try food. Denies abdominal pain, had last BM yesterday. Does take constipation meds at home.  ? ?Objective   ?Blood pressure 131/78, pulse 66, temperature 98 ?F (36.7 ?C), temperature source Oral, resp. rate (!) 27, height '5\' 6"'$  (1.676 m), weight 86.2 kg, SpO2 90 %. ?   ?Vent Mode: BIPAP ?FiO2 (%):  [80 %-100 %] 100 % ?Set Rate:  [14 bmp] 14 bmp ?PEEP:  [10 cmH20] 10 cmH20  ? ?Intake/Output Summary (Last 24 hours) at 07/18/2021 0731 ?Last data filed at 07/18/2021 9476 ?Gross per 24 hour  ?Intake 1809.87 ml  ?Output 2140 ml  ?Net -330.13 ml  ? ? ?Filed Weights  ? 07/16/21 0749 07/17/21 0601 07/18/21 0338  ?Weight:  83.9 kg 88.7 kg 86.2 kg  ? ?Examination: ?General: Resting in bed in mild distress on heated high flow ?HENT: Normocephalic, atraumatic, dry mucous membranes ?Lungs: Tachypnea. No use accessory muscles. Clear to auscultation anterior lung fields. ?Cardiovascular: Regular rate, rhythm. No murmurs appreciated. ?Abdomen: Soft, non-tender, mildly distended. Hypoactive bowel sounds. ?Extremities: Warm, dry. No peripheral edema appreciated. ?Neuro: Awake, alert, conversing appropriately. Grossly non-focal. ? ?Assessment & Plan:  ?#Acute hypoxic respiratory failure 2/2 ARDS ?#Multi-focal community acquired pneumonia, likely atypical vs viral ?#History of tobacco use disorder, suspicion for obstructive lung disease ?Overnight patient refused BiPAP, continues on high flow nasal cannula, currently 40L/min w/ 100% FiO2. He has remained afebrile since yesterday. Blood cultures NGTD, RVP negative. Covering MRSA w/ positive MRSA swab. Leukocytosis improving. Continuing with stress dose steroids. This morning he was not maintaining adequate oxygenation on high flow. Patient was subsequently intubated this morning. ?- Azithromycin, ceftriaxone, linezolid ?- Solumedrol '60mg'$  every 12 hours ?- Lung protective ventilation ?- VAP bundle ?- Stress ulcer prophylaxis ?- Prone daily ?- Follow-up cultures ?- Duonebs q4h ?- Start DYS3 diet ? ?#Type II diabetes mellitus ?A1c 6.6%. Sugars mildly elevated, likely due to high-dose steroids. This AM glucose 160. Will continue with current regimen.  ?- SSI  ? ?#Abdominal distention ?This AM patient denies abdominal pain, nausea, vomiting. Had last BM yesterday. Does take medications for constipation at home,  will add daily Miralax. ?- Start daily Miralax ? ?#Hypertension ?BP stable on home medications. ? ?#Hypokalemia ?Goal K>4, Mg>2, replete as needed ? ?Best Practice (right click and "Reselect all SmartList Selections" daily)  ? ?Diet/type: dysphagia diet (see orders) ?DVT prophylaxis: LMWH ?GI  prophylaxis: PPI ?Lines: N/A ?Foley:  N/A ?Code Status:  full code ?Last date of multidisciplinary goals of care discussion '[]'$  ? ?Labs   ?CBC: ?Recent Labs  ?Lab 07/16/21 ?0805 07/16/21 ?3149 07/16/21 ?2003 07/17/21 ?7026 07/18/21 ?0127  ?WBC 16.8*  --   --  21.8* 18.6*  ?NEUTROABS 14.7*  --   --   --   --   ?HGB 13.2 14.3 13.6 13.5 11.8*  ?HCT 38.7* 42.0 40.0 39.2 35.1*  ?MCV 81.1  --   --  80.8 80.3  ?PLT 203  --   --  241 236  ? ? ? ?Basic Metabolic Panel: ?Recent Labs  ?Lab 07/16/21 ?0805 07/16/21 ?3785 07/16/21 ?0915 07/16/21 ?1612 07/16/21 ?2003 07/17/21 ?8850 07/18/21 ?0127  ?NA 135 136  --  136 138 137 136  ?K 2.7* 2.7*  --  3.5 3.3* 3.5 3.9  ?CL 102  --   --  104  --  105 101  ?CO2 20*  --   --  18*  --  22 21*  ?GLUCOSE 162*  --   --  179*  --  211* 176*  ?BUN 13  --   --  13  --  19 25*  ?CREATININE 1.12  --   --  0.97  --  1.07 0.92  ?CALCIUM 8.2*  --   --  8.4*  --  8.7* 8.4*  ?MG  --   --  1.9  --   --  2.3 2.4  ? ? ?GFR: ?Estimated Creatinine Clearance: 82.4 mL/min (by C-G formula based on SCr of 0.92 mg/dL). ?Recent Labs  ?Lab 07/16/21 ?0805 07/16/21 ?0915 07/16/21 ?2774 07/17/21 ?1287 07/18/21 ?0127  ?PROCALCITON  --  0.53  --   --   --   ?WBC 16.8*  --   --  21.8* 18.6*  ?LATICACIDVEN 0.9  --  1.0  --   --   ? ? ? ?Liver Function Tests: ?No results for input(s): AST, ALT, ALKPHOS, BILITOT, PROT, ALBUMIN in the last 168 hours. ?No results for input(s): LIPASE, AMYLASE in the last 168 hours. ?No results for input(s): AMMONIA in the last 168 hours. ? ?ABG ?   ?Component Value Date/Time  ? PHART 7.46 (H) 07/17/2021 8676  ? PCO2ART 36 07/17/2021 0212  ? PO2ART 71 (L) 07/17/2021 7209  ? HCO3 25.7 07/17/2021 0212  ? TCO2 22 07/16/2021 2003  ? ACIDBASEDEF 2.0 07/16/2021 2003  ? O2SAT 96.7 07/17/2021 0212  ? ?  ? ?Coagulation Profile: ?Recent Labs  ?Lab 07/16/21 ?0915  ?INR 1.1  ? ? ? ?Cardiac Enzymes: ?No results for input(s): CKTOTAL, CKMB, CKMBINDEX, TROPONINI in the last 168 hours. ? ?HbA1C: ?Hgb A1c  MFr Bld  ?Date/Time Value Ref Range Status  ?07/16/2021 11:49 AM 6.6 (H) 4.8 - 5.6 % Final  ?  Comment:  ?  (NOTE) ?Pre diabetes:          5.7%-6.4% ? ?Diabetes:              >6.4% ? ?Glycemic control for   <7.0% ?adults with diabetes ?  ?12/28/2015 08:35 PM 5.8 (H) 4.8 - 5.6 % Final  ?  Comment:  ?  (NOTE) ?  Pre-diabetes: 5.7 - 6.4 ?        Diabetes: >6.4 ?        Glycemic control for adults with diabetes: <7.0 ?  ? ? ?CBG: ?Recent Labs  ?Lab 07/17/21 ?1129 07/17/21 ?1614 07/17/21 ?2132 07/17/21 ?2310 07/18/21 ?0316  ?GLUCAP 195* 230* 204* 204* 161*  ? ? ?Critical care time: 30 min ?  ? ?Sanjuan Dame, MD ?Internal Medicine PGY-2 ?Pager: [phone] ? ? ? ? ?

## 2021-07-18 NOTE — Progress Notes (Signed)
RT NOTE: patient currently sleeping at this time with HFNC and NRB with sats of 88%.  Sat goal is >88%.  Will continue to monitor.  ?

## 2021-07-18 NOTE — Procedures (Signed)
Intubation Procedure Note ? ?Wilman Tucker Molock  ?[id number]  ?1956/01/20 ? ?Date:07/18/21  ?Time:10:44 AM  ? ?Provider Performing:Norbert Malkin Naomie Dean  ? ? ?Procedure: Intubation (72620) ? ?Indication(s) ?Respiratory Failure ? ?Consent ?Risks of the procedure as well as the alternatives and risks of each were explained to the patient and/or caregiver.  Consent for the procedure was obtained and is signed in the bedside chart ? ? ?Anesthesia ?Etomidate and Rocuronium ? ? ?Time Out ?Verified patient identification, verified procedure, site/side was marked, verified correct patient position, special equipment/implants available, medications/allergies/relevant history reviewed, required imaging and test results available. ? ? ?Sterile Technique ?Usual hand hygeine, masks, and gloves were used ? ? ?Procedure Description ?Patient positioned in bed supine.  Sedation given as noted above.  Patient was intubated with endotracheal tube using Glidescope.  View was Grade 1 full glottis .  Number of attempts was 1.  Colorimetric CO2 detector was consistent with tracheal placement. ? ? ?Complications/Tolerance ?Desaturation during BVM & intubation, saturations recovered quickly ?Chest X-ray is ordered to verify placement. ? ? ?EBL ?Minimal ? ? ?Specimen(s) ?None ? ?[name], DO 07/18/21 10:45 AM ?[facility] ? ?

## 2021-07-18 NOTE — Progress Notes (Signed)
This morning not maintaining saturations. Now in low to mid 80s on NRB & HHFNC. Attempts to optimize positioning were not successful. I spoke to his wife to let her know that his breathing was worse. We discussed that we need to proceed with intubation at this time and she agrees. ? ?[name], DO 07/18/21 10:22 AM ?[facility] ? ?

## 2021-07-19 ENCOUNTER — Inpatient Hospital Stay (HOSPITAL_COMMUNITY): Payer: Medicare Other

## 2021-07-19 DIAGNOSIS — J80 Acute respiratory distress syndrome: Secondary | ICD-10-CM | POA: Diagnosis not present

## 2021-07-19 DIAGNOSIS — J189 Pneumonia, unspecified organism: Secondary | ICD-10-CM | POA: Diagnosis not present

## 2021-07-19 DIAGNOSIS — J9601 Acute respiratory failure with hypoxia: Secondary | ICD-10-CM | POA: Diagnosis not present

## 2021-07-19 LAB — CBC
HCT: 37.2 % — ABNORMAL LOW (ref 39.0–52.0)
Hemoglobin: 12 g/dL — ABNORMAL LOW (ref 13.0–17.0)
MCH: 27.4 pg (ref 26.0–34.0)
MCHC: 32.3 g/dL (ref 30.0–36.0)
MCV: 84.9 fL (ref 80.0–100.0)
Platelets: 265 10*3/uL (ref 150–400)
RBC: 4.38 MIL/uL (ref 4.22–5.81)
RDW: 16.1 % — ABNORMAL HIGH (ref 11.5–15.5)
WBC: 18.2 10*3/uL — ABNORMAL HIGH (ref 4.0–10.5)
nRBC: 0 % (ref 0.0–0.2)

## 2021-07-19 LAB — BLOOD GAS, ARTERIAL
Acid-base deficit: 1.7 mmol/L (ref 0.0–2.0)
Bicarbonate: 28.7 mmol/L — ABNORMAL HIGH (ref 20.0–28.0)
O2 Saturation: 97 %
Patient temperature: 37
pCO2 arterial: 77 mmHg (ref 32–48)
pH, Arterial: 7.18 — CL (ref 7.35–7.45)
pO2, Arterial: 92 mmHg (ref 83–108)

## 2021-07-19 LAB — BASIC METABOLIC PANEL
Anion gap: 9 (ref 5–15)
BUN: 36 mg/dL — ABNORMAL HIGH (ref 8–23)
CO2: 29 mmol/L (ref 22–32)
Calcium: 7.9 mg/dL — ABNORMAL LOW (ref 8.9–10.3)
Chloride: 99 mmol/L (ref 98–111)
Creatinine, Ser: 1.11 mg/dL (ref 0.61–1.24)
GFR, Estimated: >60 mL/min (ref >60)
Glucose, Bld: 187 mg/dL — ABNORMAL HIGH (ref 70–99)
Potassium: 4.7 mmol/L (ref 3.5–5.1)
Sodium: 137 mmol/L (ref 135–145)

## 2021-07-19 LAB — GLUCOSE, CAPILLARY
Glucose-Capillary: 205 mg/dL — ABNORMAL HIGH (ref 70–99)
Glucose-Capillary: 184 mg/dL — ABNORMAL HIGH (ref 70–99)
Glucose-Capillary: 229 mg/dL — ABNORMAL HIGH (ref 70–99)
Glucose-Capillary: 182 mg/dL — ABNORMAL HIGH (ref 70–99)
Glucose-Capillary: 150 mg/dL — ABNORMAL HIGH (ref 70–99)
Glucose-Capillary: 120 mg/dL — ABNORMAL HIGH (ref 70–99)

## 2021-07-19 LAB — TRIGLYCERIDES: Triglycerides: 443 mg/dL — ABNORMAL HIGH (ref <150)

## 2021-07-19 MED ORDER — VITAL 1.5 CAL PO LIQD
1000.0000 mL | ORAL | Status: DC
  Administered 2021-07-19 – 2021-07-25 (×8): 1000 mL

## 2021-07-19 MED ORDER — MUPIROCIN 2 % EX OINT
1.0000 "application " | TOPICAL_OINTMENT | Freq: Two times a day (BID) | CUTANEOUS | Status: AC
  Administered 2021-07-19 – 2021-07-23 (×10): 1 via NASAL
  Filled 2021-07-19 (×2): qty 22

## 2021-07-19 MED ORDER — FUROSEMIDE 10 MG/ML IJ SOLN
20.0000 mg | Freq: Two times a day (BID) | INTRAMUSCULAR | Status: AC
  Administered 2021-07-19 (×2): 20 mg via INTRAVENOUS
  Filled 2021-07-19 (×2): qty 2

## 2021-07-19 MED ORDER — INSULIN ASPART 100 UNIT/ML IJ SOLN
3.0000 [IU] | INTRAMUSCULAR | Status: DC
  Administered 2021-07-19 – 2021-07-21 (×11): 3 [IU] via SUBCUTANEOUS

## 2021-07-19 MED ORDER — PROSOURCE TF PO LIQD
45.0000 mL | Freq: Three times a day (TID) | ORAL | Status: DC
  Administered 2021-07-19 – 2021-07-30 (×33): 45 mL
  Filled 2021-07-19 (×33): qty 45

## 2021-07-19 MED ORDER — INSULIN ASPART 100 UNIT/ML IJ SOLN
3.0000 [IU] | INTRAMUSCULAR | Status: DC
Start: 2021-07-19 — End: 2021-07-19

## 2021-07-19 NOTE — Progress Notes (Signed)
RT NOTE: ? ?Pt currently in prone position. RT assisted with turning pt's head to the right and rotating arms. ETT secure throughout.  ?

## 2021-07-19 NOTE — Progress Notes (Addendum)
Nutrition Follow-up ? ?DOCUMENTATION CODES:  ? ?Not applicable ? ?INTERVENTION:  ? ?Tube feeding via Cortrak tube: ?Vital 1.5 at 20 ml/h advance by 10 ml every 4 hours to goal rate of 50 ml/h (1200 ml per day) with Prosource TF 45 ml TID. ? ?At goal rate, TF regimen provides 1920 kcal (2329 kcal total with current propofol), 114 gm protein, 917 ml free water daily. ? ?NUTRITION DIAGNOSIS:  ? ?Inadequate oral intake related to inability to eat as evidenced by NPO status. ? ?GOAL:  ? ?Patient will meet greater than or equal to 90% of their needs ? ?MONITOR:  ? ?Vent status, TF tolerance, Labs ? ?REASON FOR ASSESSMENT:  ? ?Ventilator, Consult ?Enteral/tube feeding initiation and management ? ?ASSESSMENT:  ? ?66 yo male admitted with SOB, CAP, ARDS. PMH includes COPD, smoker, HTN, aortic valve stenosis, hepatitis C, prostate cancer, BPH, PTSD, MDD. ? ?Discussed patient in ICU rounds and with RN today. ?Patient was proned yesterday. Currently supine with plans to re-prone this afternoon.  ?Patient has been tolerating TF at 20 ml/h via OG tube.  ?Cortrak placed today with tip in the distal stomach per X-ray. ?Okay to advance tube feeding today.  ? ?Patient remains intubated on ventilator support. ?MV: 11.2 L/min ?Temp (24hrs), Avg:97.7 ?F (36.5 ?C), Min:97.6 ?F (36.4 ?C), Max:98 ?F (36.7 ?C) ? ?Propofol: 15.5 ml/hr providing 409 kcal from lipid. ? ?Labs reviewed.  ?CBG: [phone] ? ?Medications reviewed and include Colace, Lasix, Novolog, Levemir, Solumedrol, Miralax, fentanyl, versed, propofol. ? ?Diet Order:   ?Diet Order   ? ?       ?  Diet NPO time specified  Diet effective now       ?  ? ?  ?  ? ?  ? ? ?EDUCATION NEEDS:  ? ?Not appropriate for education at this time ? ?Skin:  Skin Assessment: Reviewed RN Assessment ? ?Last BM:  5/10 type 2 ? ?Height:  ? ?Ht Readings from Last 1 Encounters:  ?07/16/21 '5\' 6"'$  (1.676 m)  ? ? ?Weight:  ? ?Wt Readings from Last 1 Encounters:  ?07/18/21 86.2 kg  ? ? ? ?BMI:  Body mass  index is 30.67 kg/m?. ? ?Estimated Nutritional Needs:  ? ?Kcal:  1950-2150 ? ?Protein:  110-130 gm ? ?Fluid:  >/= 2 L ? ? ? ?Lucas Mallow RD, LDN, CNSC ?Please refer to Amion for contact information.                                                       ? ?

## 2021-07-19 NOTE — Procedures (Signed)
Cortrak ? ?Person Inserting Tube:  Asencion Islam, RD ?Tube Type:  Cortrak - 43 inches ?Tube Size:  10 ?Tube Location:  Left nare ?Secured by: Dorann Lodge ?Technique Used to Measure Tube Placement:  Marking at nare/corner of mouth ?Cortrak Secured At:  85 cm ? ?Cortrak Tube Team Note: ? ?Consult received to place a Cortrak feeding tube.  ? ?X-ray is required, abdominal x-ray has been ordered by the Cortrak team. Please confirm tube placement before using the Cortrak tube.  ? ?If the tube becomes dislodged please keep the tube and contact the Cortrak team at [email or web address] (password [email or web address]) for replacement.  ?If after hours and replacement cannot be delayed, place a NG tube and confirm placement with an abdominal x-ray.  ? ?Lockie Pares., RD, LDN, CNSC ?See AMiON for contact information  ? ? ?

## 2021-07-19 NOTE — Progress Notes (Signed)
RT NOTE: ?  ?Pt currently in prone position. RT assisted with turning pt's head to the left and rotating arms. ETT secure throughout ?

## 2021-07-19 NOTE — Progress Notes (Signed)
? ?NAME:  Zachary Briggs, MRN:  [id number], DOB:  Mar 15, 1955, LOS: 3 ?ADMISSION DATE:  07/16/2021, CONSULTATION DATE:  07/17/21 ?REFERRING MD:  IMTS, CHIEF COMPLAINT:  Shortness of breath  ? ?History of Present Illness:  ?66 yo man with hx of COPD, shortness of breath and productive cough x 2 days.   ?Admitted 5/9 AM.  Occasional chest pain.   ? Cough with yellow sputum, orthopnea.  ?Uses a walker, not terribly active at baseline.   ?Current long term smoker.   ? ?Was initially on bipap and tolerating well.  ?Trial off bipap early am, to HFNC, did not tolerate well.   ?Placed back on bipap at about 445. Anxiety concurrent with mask.  Sat dropped to 70s. Bipap titrated up to 12 over 10, gradually sat came up to high 90s.   ?Initial abg just with hypoxemia, CO2 36.   ?Started looking slightly better with higher bipap settings.   ?Trial of lasix.   ?Transported to 6E at 1130pm from ED ? ?Pertinent  Medical History  ?COPD ?HTN ?Aortic valve stenosis ?NSVT/PSVT with loop recorder ?Prostate carcinoma ?BPH  ?PTSD ?MDD ? ?Coreg, asa, lipitor, hydrodiuil, nortriptyline, protonix, zoloft, flomax  ? ?Significant Hospital Events: ?Including procedures, antibiotic start and stop dates in addition to other pertinent events   ? ? ?Interim History / Subjective:  ?No acute events overnight. Intubated and sedated. ? ?Objective   ?Blood pressure 93/64, pulse 90, temperature 97.6 ?F (36.4 ?C), temperature source Axillary, resp. rate (!) 30, height _0  (1.676 m), weight 86.2 kg, SpO2 96 %. ?   ?Vent Mode: PRVC ?FiO2 (%):  [70 %-100 %] 70 % ?Set Rate:  [30 bmp] 30 bmp ?Vt Set:  [380 mL] 380 mL ?PEEP:  [10 cmH20-15 cmH20] 12 cmH20 ?Plateau Pressure:  [23 cmH20-30 cmH20] 27 cmH20  ? ?Intake/Output Summary (Last 24 hours) at 07/19/2021 0825 ?Last data filed at 07/19/2021 0800 ?Gross per 24 hour  ?Intake 2153.22 ml  ?Output 2035 ml  ?Net 118.22 ml  ? ? ?Filed Weights  ? 07/16/21 0749 07/17/21 0601 07/18/21 0338  ?Weight: 83.9 kg 88.7 kg 86.2  kg  ? ?Examination: ?General: Resting in bed, no acute distress, intubated ?HENT: Normocephalic, atraumatic, dry mucous membranes ?Lungs: Intubated, sedated. Clear to auscultation anterior lung fields. ?Cardiovascular: Regular rate, rhythm. No murmurs appreciated. ?Abdomen: Soft, non-tender, mildly distended. Hypoactive bowel sounds. ?Extremities: Warm, dry. No peripheral edema appreciated. ?Neuro: Sedated, non-focal.  ? ?Assessment & Plan:  ?#Acute hypoxic respiratory failure 2/2 ARDS ?#Multi-focal community acquired pneumonia, likely atypical vs viral ?#History of tobacco use disorder, suspicion for obstructive lung disease ?Yesterday patient intubated given he was not maintaining adequate oxygen saturation on high flow. Bcx NGTD thus far. BAL w/ few WBC, no organisms. Continues to be intubated, sedated, with neuromuscular blockade. Will continue with ARDS protocol, steroids, and antibiotics. Bronchoscopy yesterday w/ minimal blood, Hgb stable today - less likely DAH. ?- Azithromycin, ceftriaxone, linezolid ?- Solumedrol 59m every 12 hours (day 3) ?- Lung protective ventilation ?- VAP bundle ?- Stress ulcer prophylaxis ?- Daily proning ?- Follow-up cultures  ?- Duonebs q4h ? ?#Type II diabetes mellitus ?A1c 6.6%. Sugars still mildly elevated, likely due to high-dose steroids. Did start long-acting insulin last night, AM CBG 187. Will continue same regimen today, adjust as needed tomorrow. ?- Semglee 10u twice daily ?- SSI  ? ?#Abdominal distention ?Abdomen still mildly distended, did have BM yesterday. Will continue with daily Miralax. ?- Daily Miralax ? ?#Propofol-induced hypertriglyceridemia ?Triglycerides elevated to  443 today after initiating propofol yesterday. Will plan to switch to versed today.  ?- D/c propofol, start versed ?- Fentanyl gtt ?- Consider addition of Seroquel  ? ?#Hypertension ?BP stable on home medications. ? ?Best Practice (right click and "Reselect all SmartList Selections" daily)   ? ?Diet/type: tubefeeds ?DVT prophylaxis: LMWH ?GI prophylaxis: PPI ?Lines: Arterial Line ?Foley:  Yes, and it is still needed ?Code Status:  full code ?Last date of multidisciplinary goals of care discussion _0  ? ?Labs   ?CBC: ?Recent Labs  ?Lab 07/16/21 ?0805 07/16/21 ?0814 07/16/21 ?2003 07/17/21 ?3545 07/18/21 ?0127 07/18/21 ?1528 07/19/21 ?0723  ?WBC 16.8*  --   --  21.8* 18.6*  --  18.2*  ?NEUTROABS 14.7*  --   --   --   --   --   --   ?HGB 13.2   < > 13.6 13.5 11.8* 12.6* 12.0*  ?HCT 38.7*   < > 40.0 39.2 35.1* 37.0* 37.2*  ?MCV 81.1  --   --  80.8 80.3  --  84.9  ?PLT 203  --   --  241 236  --  265  ? < > = values in this interval not displayed.  ? ? ? ?Basic Metabolic Panel: ?Recent Labs  ?Lab 07/16/21 ?0805 07/16/21 ?6256 07/16/21 ?0915 07/16/21 ?1612 07/16/21 ?2003 07/17/21 ?3893 07/18/21 ?0127 07/18/21 ?1528 07/19/21 ?0723  ?NA 135   < >  --  136 138 137 136 138 137  ?K 2.7*   < >  --  3.5 3.3* 3.5 3.9 4.2 4.7  ?CL 102  --   --  104  --  105 101  --  99  ?CO2 20*  --   --  18*  --  22 21*  --  29  ?GLUCOSE 162*  --   --  179*  --  211* 176*  --  187*  ?BUN 13  --   --  13  --  19 25*  --  36*  ?CREATININE 1.12  --   --  0.97  --  1.07 0.92  --  1.11  ?CALCIUM 8.2*  --   --  8.4*  --  8.7* 8.4*  --  7.9*  ?MG  --   --  1.9  --   --  2.3 2.4  --   --   ? < > = values in this interval not displayed.  ? ? ?GFR: ?Estimated Creatinine Clearance: 68.3 mL/min (by C-G formula based on SCr of 1.11 mg/dL). ?Recent Labs  ?Lab 07/16/21 ?0805 07/16/21 ?0915 07/16/21 ?7342 07/17/21 ?8768 07/18/21 ?0127 07/19/21 ?1157  ?PROCALCITON  --  0.53  --   --   --   --   ?WBC 16.8*  --   --  21.8* 18.6* 18.2*  ?LATICACIDVEN 0.9  --  1.0  --   --   --   ? ? ? ?Liver Function Tests: ?No results for input(s): AST, ALT, ALKPHOS, BILITOT, PROT, ALBUMIN in the last 168 hours. ?No results for input(s): LIPASE, AMYLASE in the last 168 hours. ?No results for input(s): AMMONIA in the last 168 hours. ? ?ABG ?   ?Component Value Date/Time   ? PHART 7.18 (LL) 07/19/2021 0735  ? PCO2ART 77 ([place]) 07/19/2021 0735  ? PO2ART 92 07/19/2021 0735  ? HCO3 28.7 (H) 07/19/2021 0735  ? TCO2 31 07/18/2021 1528  ? ACIDBASEDEF 1.7 07/19/2021 0735  ? O2SAT 97 07/19/2021 0735  ? ?  ? ?Coagulation Profile: ?Recent  Labs  ?Lab 07/16/21 ?0915  ?INR 1.1  ? ? ? ?Cardiac Enzymes: ?No results for input(s): CKTOTAL, CKMB, CKMBINDEX, TROPONINI in the last 168 hours. ? ?HbA1C: ?Hgb A1c MFr Bld  ?Date/Time Value Ref Range Status  ?07/16/2021 11:49 AM 6.6 (H) 4.8 - 5.6 % Final  ?  Comment:  ?  (NOTE) ?Pre diabetes:          5.7%-6.4% ? ?Diabetes:              >6.4% ? ?Glycemic control for   <7.0% ?adults with diabetes ?  ?12/28/2015 08:35 PM 5.8 (H) 4.8 - 5.6 % Final  ?  Comment:  ?  (NOTE) ?        Pre-diabetes: 5.7 - 6.4 ?        Diabetes: >6.4 ?        Glycemic control for adults with diabetes: <7.0 ?  ? ? ?CBG: ?Recent Labs  ?Lab 07/18/21 ?1115 07/18/21 ?1529 07/18/21 ?1918 07/18/21 ?2316 07/19/21 ?0324  ?GLUCAP 218* 182* 180* 226* 205*  ? ? ?Critical care time: 35 min ?  ? ?Sanjuan Dame, MD ?Internal Medicine PGY-2 ?Pager: [phone] ? ? ? ? ?

## 2021-07-19 NOTE — Progress Notes (Signed)
RT NOTE: patient did not meet SBT criteria this AM due to PEEP and FIO2 requirements.  Tolerating current ventilator settings well at this time.  Will continue to monitor.  ?

## 2021-07-20 ENCOUNTER — Inpatient Hospital Stay (HOSPITAL_COMMUNITY): Payer: Medicare Other

## 2021-07-20 DIAGNOSIS — J80 Acute respiratory distress syndrome: Secondary | ICD-10-CM | POA: Diagnosis present

## 2021-07-20 DIAGNOSIS — J9601 Acute respiratory failure with hypoxia: Secondary | ICD-10-CM | POA: Diagnosis not present

## 2021-07-20 DIAGNOSIS — J189 Pneumonia, unspecified organism: Secondary | ICD-10-CM | POA: Diagnosis not present

## 2021-07-20 DIAGNOSIS — Z9911 Dependence on respirator [ventilator] status: Secondary | ICD-10-CM | POA: Diagnosis not present

## 2021-07-20 DIAGNOSIS — J9621 Acute and chronic respiratory failure with hypoxia: Secondary | ICD-10-CM | POA: Insufficient documentation

## 2021-07-20 DIAGNOSIS — E1165 Type 2 diabetes mellitus with hyperglycemia: Secondary | ICD-10-CM

## 2021-07-20 DIAGNOSIS — E669 Obesity, unspecified: Secondary | ICD-10-CM | POA: Insufficient documentation

## 2021-07-20 LAB — CBC
HCT: 38.6 % — ABNORMAL LOW (ref 39.0–52.0)
Hemoglobin: 11.8 g/dL — ABNORMAL LOW (ref 13.0–17.0)
MCH: 26.8 pg (ref 26.0–34.0)
MCHC: 30.6 g/dL (ref 30.0–36.0)
MCV: 87.5 fL (ref 80.0–100.0)
Platelets: 272 10*3/uL (ref 150–400)
RBC: 4.41 MIL/uL (ref 4.22–5.81)
RDW: 16.6 % — ABNORMAL HIGH (ref 11.5–15.5)
WBC: 14 10*3/uL — ABNORMAL HIGH (ref 4.0–10.5)
nRBC: 0 % (ref 0.0–0.2)

## 2021-07-20 LAB — POCT I-STAT 7, (LYTES, BLD GAS, ICA,H+H)
Acid-Base Excess: 4 mmol/L — ABNORMAL HIGH (ref 0.0–2.0)
Bicarbonate: 32.6 mmol/L — ABNORMAL HIGH (ref 20.0–28.0)
Calcium, Ion: 1.14 mmol/L — ABNORMAL LOW (ref 1.15–1.40)
HCT: 35 % — ABNORMAL LOW (ref 39.0–52.0)
Hemoglobin: 11.9 g/dL — ABNORMAL LOW (ref 13.0–17.0)
O2 Saturation: 92 %
Patient temperature: 97.3
Potassium: 4.5 mmol/L (ref 3.5–5.1)
Sodium: 144 mmol/L (ref 135–145)
TCO2: 35 mmol/L — ABNORMAL HIGH (ref 22–32)
pCO2 arterial: 71 mmHg (ref 32–48)
pH, Arterial: 7.266 — ABNORMAL LOW (ref 7.35–7.45)
pO2, Arterial: 73 mmHg — ABNORMAL LOW (ref 83–108)

## 2021-07-20 LAB — COMPREHENSIVE METABOLIC PANEL
ALT: 90 U/L — ABNORMAL HIGH (ref 0–44)
AST: 41 U/L (ref 15–41)
Albumin: 2.3 g/dL — ABNORMAL LOW (ref 3.5–5.0)
Alkaline Phosphatase: 123 U/L (ref 38–126)
Anion gap: 6 (ref 5–15)
BUN: 43 mg/dL — ABNORMAL HIGH (ref 8–23)
CO2: 32 mmol/L (ref 22–32)
Calcium: 8.2 mg/dL — ABNORMAL LOW (ref 8.9–10.3)
Chloride: 104 mmol/L (ref 98–111)
Creatinine, Ser: 1.17 mg/dL (ref 0.61–1.24)
GFR, Estimated: >60 mL/min (ref >60)
Glucose, Bld: 205 mg/dL — ABNORMAL HIGH (ref 70–99)
Potassium: 4.9 mmol/L (ref 3.5–5.1)
Sodium: 142 mmol/L (ref 135–145)
Total Bilirubin: 0.3 mg/dL (ref 0.3–1.2)
Total Protein: 6.3 g/dL — ABNORMAL LOW (ref 6.5–8.1)

## 2021-07-20 LAB — GLUCOSE, CAPILLARY
Glucose-Capillary: 131 mg/dL — ABNORMAL HIGH (ref 70–99)
Glucose-Capillary: 161 mg/dL — ABNORMAL HIGH (ref 70–99)
Glucose-Capillary: 158 mg/dL — ABNORMAL HIGH (ref 70–99)
Glucose-Capillary: 96 mg/dL (ref 70–99)
Glucose-Capillary: 112 mg/dL — ABNORMAL HIGH (ref 70–99)

## 2021-07-20 LAB — CULTURE, RESPIRATORY W GRAM STAIN: Culture: NO GROWTH

## 2021-07-20 LAB — BRAIN NATRIURETIC PEPTIDE: B Natriuretic Peptide: 88.8 pg/mL (ref 0.0–100.0)

## 2021-07-20 LAB — MAGNESIUM: Magnesium: 3.3 mg/dL — ABNORMAL HIGH (ref 1.7–2.4)

## 2021-07-20 MED ORDER — METHYLPREDNISOLONE SODIUM SUCC 125 MG IJ SOLR
60.0000 mg | INTRAMUSCULAR | Status: DC
  Administered 2021-07-20 – 2021-07-22 (×3): 60 mg via INTRAVENOUS
  Filled 2021-07-20 (×3): qty 2

## 2021-07-20 MED ORDER — NICARDIPINE HCL IN NACL 20-0.86 MG/200ML-% IV SOLN
3.0000 mg/h | INTRAVENOUS | Status: DC
  Administered 2021-07-20: 5 mg/h via INTRAVENOUS
  Administered 2021-07-20 – 2021-07-21 (×2): 10 mg/h via INTRAVENOUS
  Administered 2021-07-21: 7.5 mg/h via INTRAVENOUS
  Administered 2021-07-21: 10 mg/h via INTRAVENOUS
  Administered 2021-07-21: 7.5 mg/h via INTRAVENOUS
  Administered 2021-07-21: 10 mg/h via INTRAVENOUS
  Administered 2021-07-21 (×2): 7.5 mg/h via INTRAVENOUS
  Filled 2021-07-20 (×11): qty 200

## 2021-07-20 MED ORDER — LABETALOL HCL 5 MG/ML IV SOLN
10.0000 mg | INTRAVENOUS | Status: DC | PRN
  Administered 2021-07-20: 10 mg via INTRAVENOUS
  Filled 2021-07-20 (×2): qty 4

## 2021-07-20 MED ORDER — LABETALOL HCL 5 MG/ML IV SOLN
10.0000 mg | INTRAVENOUS | Status: DC | PRN
  Administered 2021-07-20 – 2021-07-24 (×15): 10 mg via INTRAVENOUS
  Filled 2021-07-20 (×14): qty 4

## 2021-07-20 MED ORDER — HEPARIN SOD (PORK) LOCK FLUSH 100 UNIT/ML IV SOLN
500.0000 [IU] | Freq: Once | INTRAVENOUS | Status: DC
  Filled 2021-07-20: qty 5

## 2021-07-20 MED ORDER — FUROSEMIDE 10 MG/ML IJ SOLN
40.0000 mg | Freq: Once | INTRAMUSCULAR | Status: AC
  Administered 2021-07-20: 40 mg via INTRAVENOUS
  Filled 2021-07-20: qty 4

## 2021-07-20 NOTE — Assessment & Plan Note (Signed)
Etiology of ARDS as yet unclear.  Cultures negative from BAL.  Suspect undefined viral illness. ? ?-Taper steroids and provide 10-day course ?-Continue linezolid, ceftriaxone, azithromycin for 10-day course. ?

## 2021-07-20 NOTE — Assessment & Plan Note (Signed)
Baseline hemoglobin A1c 6.6 ?Component of stress hyperglycemia. ? ?-Continue current insulin regimen. ?

## 2021-07-20 NOTE — Progress Notes (Signed)
RT NOTE: ?  ?Pt currently in prone position. RT assisted with turning pt's head to the left and rotating arms. ETT secure throughout ?

## 2021-07-20 NOTE — Progress Notes (Signed)
RT NOTE: ?  ?Pt currently in prone position. RT assisted with turning pt's head to the right and rotating arms. ETT secure throughout.  ?

## 2021-07-20 NOTE — Progress Notes (Addendum)
? ?NAME:  Zachary Briggs, MRN:  [id number], DOB:  May 01, 1955, LOS: 4 ?ADMISSION DATE:  07/16/2021, CONSULTATION DATE:  07/17/21 ?REFERRING MD:  IMTS, CHIEF COMPLAINT:  Shortness of breath  ? ?History of Present Illness:  ?66 yo man with hx of COPD, shortness of breath and productive cough x 2 days.   ?Admitted 5/9 AM.  Occasional chest pain.   ? Cough with yellow sputum, orthopnea.  ?Uses a walker, not terribly active at baseline.   ?Current long term smoker.   ? ?Was initially on bipap and tolerating well.  ?Trial off bipap early am, to HFNC, did not tolerate well.   ?Placed back on bipap at about 445. Anxiety concurrent with mask.  Sat dropped to 70s. Bipap titrated up to 12 over 10, gradually sat came up to high 90s.   ?Initial abg just with hypoxemia, CO2 36.   ?Started looking slightly better with higher bipap settings.   ?Trial of lasix.   ?Transported to 6E at 1130pm from ED ? ?Pertinent  Medical History  ?COPD ?HTN ?Aortic valve stenosis ?NSVT/PSVT with loop recorder ?Prostate carcinoma ?BPH  ?PTSD ?MDD ? ?Coreg, asa, lipitor, hydrodiuil, nortriptyline, protonix, zoloft, flomax  ? ?Significant Hospital Events: ?Including procedures, antibiotic start and stop dates in addition to other pertinent events   ?5/12 intubated started on neuromuscular blockade and placed in prone position ?5/14 now tolerating supine ventilation. ? ?Interim History / Subjective:  ?Tolerating supine ventilation this morning.  Improving oxygenation.  Still minimally responsive. ? ?Objective   ?Blood pressure 98/63, pulse 71, temperature 97.6 ?F (36.4 ?C), temperature source Axillary, resp. rate (!) 30, height _0  (1.676 m), weight 84.7 kg, SpO2 100 %. ?   ?Vent Mode: PRVC ?FiO2 (%):  [40 %-70 %] 40 % ?Set Rate:  [30 bmp] 30 bmp ?Vt Set:  [380 mL] 380 mL ?PEEP:  [10 cmH20-12 cmH20] 12 cmH20 ?Plateau Pressure:  [22 cmH20-26 cmH20] 26 cmH20  ? ?Intake/Output Summary (Last 24 hours) at 07/20/2021 1139 ?Last data filed at 07/20/2021  1000 ?Gross per 24 hour  ?Intake 3001.77 ml  ?Output 1675 ml  ?Net 1326.77 ml  ? ? ?Filed Weights  ? 07/17/21 0601 07/18/21 0338 07/20/21 0500  ?Weight: 88.7 kg 86.2 kg 84.7 kg  ? ?Examination: ?General: Resting in bed, no acute distress, intubated.  Sedated on neuromuscular blockade. ?HENT: O ET tube and OG tube in place.  Tube feeds running. ?Lungs: Intubated, sedated. Clear to auscultation anterior lung fields.  Bronchial breath sounds with crackles at left base have improved ?Cardiovascular: Regular rate, rhythm. No murmurs appreciated.  Extremities warm and well-perfused ?Abdomen: Soft, non-tender, mildly distended. Hypoactive bowel sounds. ?Extremities: Warm, dry. No peripheral edema appreciated. ?Neuro: Sedated, non-focal.  ? ?Assessment & Plan:  ?ARDS (adult respiratory distress syndrome) ([place]) ?Etiology of ARDS as yet unclear.  Cultures negative from BAL.  Suspect undefined viral illness. ? ?-Taper steroids and provide 10-day course ?-Continue linezolid, ceftriaxone, azithromycin for 10-day course. ? ?Acute on chronic respiratory failure with hypoxia and hypercapnia (HCC) ?Patient has morbid obesity suspect has component chronic obesity hypoventilation. ?Patient presented with acute shortness of breath and cough. ?Severe hypoxia and hypercapnia, initially required neuromuscular blockade and prone ventilation. ?Tolerating supine ventilation now.  Off neuromuscular blockade does better in right lateral decubitus.  Airway pressures are acceptable ? ?-Continue supine ventilation ?-Reduce PEEP to 10 and wean sequentially to 5 as tolerated. ?-Begin weaning sedation, now targeting RASS -1, -2 ?-May be in position for SBT tomorrow ? ?Hypertension ?  On amlodipine, HCTZ, carvedilol and valsartan at home. ?Blood pressure elevated with systolics above 595. ?Required initiation of nicardipine infusion. ? ?-Restart home amlodipine and carvedilol today. ?-Attempt diuresis as well. ? ?Obesity (BMI 30-39.9) ?Likely has  obesity hypoventilation. ? ?Type 2 diabetes mellitus with hyperglycemia ([place]) ?Baseline hemoglobin A1c 6.6 ?Component of stress hyperglycemia. ? ?-Continue current insulin regimen. ? ?Hypernatremia ?Due to insensitive losses ? ?-Start free water. ? ?Best Practice (right click and "Reselect all SmartList Selections" daily)  ? ?Diet/type: tubefeeds ?DVT prophylaxis: LMWH ?GI prophylaxis: PPI ?Lines: Arterial Line ?Foley:  Yes, and it is still needed ?Code Status:  full code ?Last date of multidisciplinary goals of care discussion _0  ? ?Labs   ?CBC: ?Recent Labs  ?Lab 07/16/21 ?0805 07/16/21 ?6387 07/17/21 ?5643 07/18/21 ?0127 07/18/21 ?1528 07/19/21 ?0723 07/20/21 ?3295 07/20/21 ?1884  ?WBC 16.8*  --  21.8* 18.6*  --  18.2* 14.0*  --   ?NEUTROABS 14.7*  --   --   --   --   --   --   --   ?HGB 13.2   < > 13.5 11.8* 12.6* 12.0* 11.8* 11.9*  ?HCT 38.7*   < > 39.2 35.1* 37.0* 37.2* 38.6* 35.0*  ?MCV 81.1  --  80.8 80.3  --  84.9 87.5  --   ?PLT 203  --  241 236  --  265 272  --   ? < > = values in this interval not displayed.  ? ? ? ?Basic Metabolic Panel: ?Recent Labs  ?Lab 07/16/21 ?0915 07/16/21 ?1612 07/16/21 ?2003 07/17/21 ?1660 07/18/21 ?0127 07/18/21 ?1528 07/19/21 ?0723 07/20/21 ?6301 07/20/21 ?6010  ?NA  --  136   < > 137 136 138 137 142 144  ?K  --  3.5   < > 3.5 3.9 4.2 4.7 4.9 4.5  ?CL  --  104  --  105 101  --  99 104  --   ?CO2  --  18*  --  22 21*  --  29 32  --   ?GLUCOSE  --  179*  --  211* 176*  --  187* 205*  --   ?BUN  --  13  --  19 25*  --  36* 43*  --   ?CREATININE  --  0.97  --  1.07 0.92  --  1.11 1.17  --   ?CALCIUM  --  8.4*  --  8.7* 8.4*  --  7.9* 8.2*  --   ?MG 1.9  --   --  2.3 2.4  --   --  3.3*  --   ? < > = values in this interval not displayed.  ? ? ?GFR: ?Estimated Creatinine Clearance: 64.3 mL/min (by C-G formula based on SCr of 1.17 mg/dL). ?Recent Labs  ?Lab 07/16/21 ?0805 07/16/21 ?0915 07/16/21 ?9323 07/17/21 ?5573 07/18/21 ?0127 07/19/21 ?2202 07/20/21 ?5427  ?PROCALCITON  --   0.53  --   --   --   --   --   ?WBC 16.8*  --   --  21.8* 18.6* 18.2* 14.0*  ?LATICACIDVEN 0.9  --  1.0  --   --   --   --   ? ? ? ?Liver Function Tests: ?Recent Labs  ?Lab 07/20/21 ?0623  ?AST 41  ?ALT 90*  ?ALKPHOS 123  ?BILITOT 0.3  ?PROT 6.3*  ?ALBUMIN 2.3*  ? ?No results for input(s): LIPASE, AMYLASE in the last 168 hours. ?No results for input(s): AMMONIA in the last  168 hours. ? ?ABG ?   ?Component Value Date/Time  ? PHART 7.266 (L) 07/20/2021 0926  ? PCO2ART 71.0 (HH) 07/20/2021 0926  ? PO2ART 73 (L) 07/20/2021 0926  ? HCO3 32.6 (H) 07/20/2021 0926  ? TCO2 35 (H) 07/20/2021 0926  ? ACIDBASEDEF 1.7 07/19/2021 0735  ? O2SAT 92 07/20/2021 0926  ? ?  ? ?Coagulation Profile: ?Recent Labs  ?Lab 07/16/21 ?0915  ?INR 1.1  ? ? ? ?Cardiac Enzymes: ?No results for input(s): CKTOTAL, CKMB, CKMBINDEX, TROPONINI in the last 168 hours. ? ?HbA1C: ?Hgb A1c MFr Bld  ?Date/Time Value Ref Range Status  ?07/16/2021 11:49 AM 6.6 (H) 4.8 - 5.6 % Final  ?  Comment:  ?  (NOTE) ?Pre diabetes:          5.7%-6.4% ? ?Diabetes:              >6.4% ? ?Glycemic control for   <7.0% ?adults with diabetes ?  ?12/28/2015 08:35 PM 5.8 (H) 4.8 - 5.6 % Final  ?  Comment:  ?  (NOTE) ?        Pre-diabetes: 5.7 - 6.4 ?        Diabetes: >6.4 ?        Glycemic control for adults with diabetes: <7.0 ?  ? ? ?CBG: ?Recent Labs  ?Lab 07/19/21 ?1508 07/19/21 ?1927 07/19/21 ?2324 07/20/21 ?7001 07/20/21 ?0732  ?GLUCAP 182* 150* [place]  ? ?CRITICAL CARE ?Performed by: Kipp Brood ? ? ?Total critical care time: 40 minutes ? ?Critical care time was exclusive of separately billable procedures and treating other patients. ? ?Critical care was necessary to treat or prevent imminent or life-threatening deterioration. ? ?Critical care was time spent personally by me on the following activities: development of treatment plan with patient and/or surrogate as well as nursing, discussions with consultants, evaluation of patient's response to treatment,  examination of patient, obtaining history from patient or surrogate, ordering and performing treatments and interventions, ordering and review of laboratory studies, ordering and review of radiographic studies, pulse oximetr

## 2021-07-20 NOTE — Assessment & Plan Note (Signed)
Likely has obesity hypoventilation. ?

## 2021-07-20 NOTE — Assessment & Plan Note (Addendum)
On amlodipine, HCTZ, carvedilol and valsartan at home. ?Blood pressure elevated with systolics above 789. ?Required initiation of nicardipine infusion. ? ?-Restart home amlodipine and carvedilol today. ?-Attempt diuresis as well. ?

## 2021-07-20 NOTE — Assessment & Plan Note (Addendum)
Patient has morbid obesity suspect has component chronic obesity hypoventilation. ?Patient presented with acute shortness of breath and cough. ?Severe hypoxia and hypercapnia, initially required neuromuscular blockade and prone ventilation. ?Tolerating supine ventilation now.  Off neuromuscular blockade does better in right lateral decubitus.  Airway pressures are acceptable ? ?-Continue supine ventilation ?-Reduce PEEP to 10 and wean sequentially to 5 as tolerated. ?-Begin weaning sedation, now targeting RASS -1, -2 ?-May be in position for SBT tomorrow ?

## 2021-07-20 NOTE — Progress Notes (Signed)
Patent placed in supine position. RT and RNx4 assist. ETT secure. Patient tolerated well and vitals are stable on this time. ?

## 2021-07-21 DIAGNOSIS — J80 Acute respiratory distress syndrome: Secondary | ICD-10-CM | POA: Diagnosis not present

## 2021-07-21 DIAGNOSIS — E870 Hyperosmolality and hypernatremia: Secondary | ICD-10-CM | POA: Insufficient documentation

## 2021-07-21 DIAGNOSIS — J189 Pneumonia, unspecified organism: Secondary | ICD-10-CM | POA: Diagnosis not present

## 2021-07-21 DIAGNOSIS — Z9911 Dependence on respirator [ventilator] status: Secondary | ICD-10-CM | POA: Diagnosis not present

## 2021-07-21 DIAGNOSIS — J9601 Acute respiratory failure with hypoxia: Secondary | ICD-10-CM | POA: Diagnosis not present

## 2021-07-21 LAB — POCT I-STAT 7, (LYTES, BLD GAS, ICA,H+H)
Acid-Base Excess: 3 mmol/L — ABNORMAL HIGH (ref 0.0–2.0)
Acid-Base Excess: 6 mmol/L — ABNORMAL HIGH (ref 0.0–2.0)
Bicarbonate: 31.8 mmol/L — ABNORMAL HIGH (ref 20.0–28.0)
Bicarbonate: 32.6 mmol/L — ABNORMAL HIGH (ref 20.0–28.0)
Calcium, Ion: 1.22 mmol/L (ref 1.15–1.40)
Calcium, Ion: 1.17 mmol/L (ref 1.15–1.40)
HCT: 37 % — ABNORMAL LOW (ref 39.0–52.0)
HCT: 35 % — ABNORMAL LOW (ref 39.0–52.0)
Hemoglobin: 12.6 g/dL — ABNORMAL LOW (ref 13.0–17.0)
Hemoglobin: 11.9 g/dL — ABNORMAL LOW (ref 13.0–17.0)
O2 Saturation: 68 %
O2 Saturation: 95 %
Patient temperature: 99.5
Patient temperature: 99.5
Potassium: 4.3 mmol/L (ref 3.5–5.1)
Potassium: 4.1 mmol/L (ref 3.5–5.1)
Sodium: 150 mmol/L — ABNORMAL HIGH (ref 135–145)
Sodium: 149 mmol/L — ABNORMAL HIGH (ref 135–145)
TCO2: 34 mmol/L — ABNORMAL HIGH (ref 22–32)
TCO2: 34 mmol/L — ABNORMAL HIGH (ref 22–32)
pCO2 arterial: 71.3 mmHg (ref 32–48)
pCO2 arterial: 56.8 mmHg — ABNORMAL HIGH (ref 32–48)
pH, Arterial: 7.26 — ABNORMAL LOW (ref 7.35–7.45)
pH, Arterial: 7.369 (ref 7.35–7.45)
pO2, Arterial: 44 mmHg — ABNORMAL LOW (ref 83–108)
pO2, Arterial: 84 mmHg (ref 83–108)

## 2021-07-21 LAB — BLOOD GAS, ARTERIAL
Acid-Base Excess: 3.5 mmol/L — ABNORMAL HIGH (ref 0.0–2.0)
Bicarbonate: 30.8 mmol/L — ABNORMAL HIGH (ref 20.0–28.0)
Drawn by: 53547
O2 Saturation: 99.2 %
Patient temperature: 37.5
pCO2 arterial: 58 mmHg — ABNORMAL HIGH (ref 32–48)
pH, Arterial: 7.33 — ABNORMAL LOW (ref 7.35–7.45)
pO2, Arterial: 179 mmHg — ABNORMAL HIGH (ref 83–108)

## 2021-07-21 LAB — CULTURE, BLOOD (ROUTINE X 2)
Culture: NO GROWTH
Culture: NO GROWTH
Special Requests: ADEQUATE

## 2021-07-21 LAB — GLUCOSE, CAPILLARY
Glucose-Capillary: 245 mg/dL — ABNORMAL HIGH (ref 70–99)
Glucose-Capillary: 256 mg/dL — ABNORMAL HIGH (ref 70–99)
Glucose-Capillary: 179 mg/dL — ABNORMAL HIGH (ref 70–99)
Glucose-Capillary: 107 mg/dL — ABNORMAL HIGH (ref 70–99)
Glucose-Capillary: 161 mg/dL — ABNORMAL HIGH (ref 70–99)
Glucose-Capillary: 108 mg/dL — ABNORMAL HIGH (ref 70–99)
Glucose-Capillary: 165 mg/dL — ABNORMAL HIGH (ref 70–99)

## 2021-07-21 MED ORDER — CARVEDILOL 25 MG PO TABS
25.0000 mg | ORAL_TABLET | Freq: Once | ORAL | Status: AC
  Administered 2021-07-21: 25 mg
  Filled 2021-07-21: qty 1

## 2021-07-21 MED ORDER — AMLODIPINE BESYLATE 10 MG PO TABS
10.0000 mg | ORAL_TABLET | Freq: Every day | ORAL | Status: DC
  Administered 2021-07-21 – 2021-07-31 (×11): 10 mg
  Filled 2021-07-21 (×11): qty 1

## 2021-07-21 MED ORDER — INSULIN ASPART 100 UNIT/ML IJ SOLN
6.0000 [IU] | INTRAMUSCULAR | Status: DC
  Administered 2021-07-21 – 2021-07-23 (×10): 6 [IU] via SUBCUTANEOUS

## 2021-07-21 MED ORDER — FREE WATER
200.0000 mL | Status: DC
  Administered 2021-07-21 – 2021-07-27 (×35): 200 mL

## 2021-07-21 MED ORDER — ATORVASTATIN CALCIUM 40 MG PO TABS
40.0000 mg | ORAL_TABLET | Freq: Every day | ORAL | Status: DC
  Administered 2021-07-22 – 2021-07-31 (×10): 40 mg
  Filled 2021-07-21 (×10): qty 1

## 2021-07-21 MED ORDER — CARVEDILOL 25 MG PO TABS
25.0000 mg | ORAL_TABLET | Freq: Two times a day (BID) | ORAL | Status: DC
  Administered 2021-07-21 – 2021-07-31 (×20): 25 mg
  Filled 2021-07-21 (×20): qty 1

## 2021-07-21 MED ORDER — SODIUM CHLORIDE 0.9 % IV SOLN
2.0000 g | INTRAVENOUS | Status: AC
  Administered 2021-07-22 – 2021-07-23 (×2): 2 g via INTRAVENOUS
  Filled 2021-07-21 (×2): qty 20

## 2021-07-21 MED ORDER — FUROSEMIDE 10 MG/ML IJ SOLN
60.0000 mg | Freq: Two times a day (BID) | INTRAMUSCULAR | Status: DC
  Administered 2021-07-21 (×2): 60 mg via INTRAVENOUS
  Filled 2021-07-21 (×2): qty 6

## 2021-07-21 NOTE — Assessment & Plan Note (Addendum)
Due to insensitive losses ? ?-Start free water. ?

## 2021-07-21 NOTE — Progress Notes (Addendum)
eLink Physician-Brief Progress Note ?Patient Name: Zachary Briggs ?DOB: Nov 19, 1955 ?MRN: [id number] ? ? ?Date of Service ? 07/21/2021  ?HPI/Events of Note ? Notified that patient was bucking the vent while on fentanyl at 273mg and fentanyl bolus and versed at '6mg'$  which was titrated to '7mg'$ .   ? ?On camera assessment, pt is now synchronous with the vet and appears comfortable.  ? ?BP 129/74, HR 85, RR 15, O2 sats 94%.   ?eICU Interventions ? Increase max of fentanyl gtt up to 3026m.    ? ? ? ?  ? ?VaElsie Lincoln5/14/2023, 9:21 PM ?

## 2021-07-22 DIAGNOSIS — J80 Acute respiratory distress syndrome: Secondary | ICD-10-CM | POA: Diagnosis not present

## 2021-07-22 DIAGNOSIS — J189 Pneumonia, unspecified organism: Secondary | ICD-10-CM | POA: Diagnosis not present

## 2021-07-22 LAB — BLOOD GAS, ARTERIAL
Acid-Base Excess: 8 mmol/L — ABNORMAL HIGH (ref 0.0–2.0)
Bicarbonate: 32.8 mmol/L — ABNORMAL HIGH (ref 20.0–28.0)
Drawn by: 36469
FIO2: 40 %
O2 Saturation: 93.8 %
Patient temperature: 37.4
pCO2 arterial: 46 mmHg (ref 32–48)
pH, Arterial: 7.46 — ABNORMAL HIGH (ref 7.35–7.45)
pO2, Arterial: 64 mmHg — ABNORMAL LOW (ref 83–108)

## 2021-07-22 LAB — GLUCOSE, CAPILLARY
Glucose-Capillary: 198 mg/dL — ABNORMAL HIGH (ref 70–99)
Glucose-Capillary: 178 mg/dL — ABNORMAL HIGH (ref 70–99)
Glucose-Capillary: 161 mg/dL — ABNORMAL HIGH (ref 70–99)
Glucose-Capillary: 125 mg/dL — ABNORMAL HIGH (ref 70–99)
Glucose-Capillary: 151 mg/dL — ABNORMAL HIGH (ref 70–99)
Glucose-Capillary: 237 mg/dL — ABNORMAL HIGH (ref 70–99)

## 2021-07-22 LAB — POCT I-STAT 7, (LYTES, BLD GAS, ICA,H+H)
Acid-Base Excess: 8 mmol/L — ABNORMAL HIGH (ref 0.0–2.0)
Bicarbonate: 33.1 mmol/L — ABNORMAL HIGH (ref 20.0–28.0)
Calcium, Ion: 1.17 mmol/L (ref 1.15–1.40)
HCT: 33 % — ABNORMAL LOW (ref 39.0–52.0)
Hemoglobin: 11.2 g/dL — ABNORMAL LOW (ref 13.0–17.0)
O2 Saturation: 84 %
Patient temperature: 99.4
Potassium: 4.1 mmol/L (ref 3.5–5.1)
Sodium: 148 mmol/L — ABNORMAL HIGH (ref 135–145)
TCO2: 34 mmol/L — ABNORMAL HIGH (ref 22–32)
pCO2 arterial: 47.2 mmHg (ref 32–48)
pH, Arterial: 7.456 — ABNORMAL HIGH (ref 7.35–7.45)
pO2, Arterial: 48 mmHg — ABNORMAL LOW (ref 83–108)

## 2021-07-22 LAB — CBC WITH DIFFERENTIAL/PLATELET
Abs Immature Granulocytes: 0.22 10*3/uL — ABNORMAL HIGH (ref 0.00–0.07)
Basophils Absolute: 0 10*3/uL (ref 0.0–0.1)
Basophils Relative: 0 %
Eosinophils Absolute: 0 10*3/uL (ref 0.0–0.5)
Eosinophils Relative: 0 %
HCT: 33.1 % — ABNORMAL LOW (ref 39.0–52.0)
Hemoglobin: 10.9 g/dL — ABNORMAL LOW (ref 13.0–17.0)
Immature Granulocytes: 1 %
Lymphocytes Relative: 8 %
Lymphs Abs: 1.2 10*3/uL (ref 0.7–4.0)
MCH: 27.1 pg (ref 26.0–34.0)
MCHC: 32.9 g/dL (ref 30.0–36.0)
MCV: 82.3 fL (ref 80.0–100.0)
Monocytes Absolute: 0.4 10*3/uL (ref 0.1–1.0)
Monocytes Relative: 2 %
Neutro Abs: 13.7 10*3/uL — ABNORMAL HIGH (ref 1.7–7.7)
Neutrophils Relative %: 89 %
Platelets: 290 10*3/uL (ref 150–400)
RBC: 4.02 MIL/uL — ABNORMAL LOW (ref 4.22–5.81)
RDW: 15.9 % — ABNORMAL HIGH (ref 11.5–15.5)
WBC: 15.4 10*3/uL — ABNORMAL HIGH (ref 4.0–10.5)
nRBC: 0 % (ref 0.0–0.2)

## 2021-07-22 LAB — BASIC METABOLIC PANEL
Anion gap: 9 (ref 5–15)
BUN: 48 mg/dL — ABNORMAL HIGH (ref 8–23)
CO2: 30 mmol/L (ref 22–32)
Calcium: 8.5 mg/dL — ABNORMAL LOW (ref 8.9–10.3)
Chloride: 107 mmol/L (ref 98–111)
Creatinine, Ser: 1.08 mg/dL (ref 0.61–1.24)
GFR, Estimated: >60 mL/min (ref >60)
Glucose, Bld: 207 mg/dL — ABNORMAL HIGH (ref 70–99)
Potassium: 4.1 mmol/L (ref 3.5–5.1)
Sodium: 146 mmol/L — ABNORMAL HIGH (ref 135–145)

## 2021-07-22 MED ORDER — OXYCODONE HCL 5 MG PO TABS
5.0000 mg | ORAL_TABLET | Freq: Four times a day (QID) | ORAL | Status: DC
  Administered 2021-07-22 – 2021-07-23 (×3): 5 mg
  Filled 2021-07-22 (×3): qty 1

## 2021-07-22 MED ORDER — FUROSEMIDE 10 MG/ML IJ SOLN
60.0000 mg | Freq: Four times a day (QID) | INTRAMUSCULAR | Status: AC
  Administered 2021-07-22 – 2021-07-23 (×2): 60 mg via INTRAVENOUS
  Filled 2021-07-22 (×2): qty 6

## 2021-07-22 MED ORDER — INSULIN DETEMIR 100 UNIT/ML ~~LOC~~ SOLN
15.0000 [IU] | Freq: Two times a day (BID) | SUBCUTANEOUS | Status: DC
  Administered 2021-07-22 (×2): 15 [IU] via SUBCUTANEOUS
  Filled 2021-07-22 (×4): qty 0.15

## 2021-07-22 MED ORDER — HALOPERIDOL LACTATE 5 MG/ML IJ SOLN
5.0000 mg | Freq: Four times a day (QID) | INTRAMUSCULAR | Status: DC | PRN
  Administered 2021-07-22 – 2021-07-24 (×2): 5 mg via INTRAVENOUS
  Filled 2021-07-22 (×2): qty 1

## 2021-07-22 MED ORDER — PANTOPRAZOLE SODIUM 40 MG IV SOLR
40.0000 mg | INTRAVENOUS | Status: DC
Start: 2021-07-23 — End: 2021-07-24
  Administered 2021-07-23: 40 mg via INTRAVENOUS
  Filled 2021-07-22: qty 10

## 2021-07-22 MED ORDER — DEXMEDETOMIDINE HCL IN NACL 400 MCG/100ML IV SOLN
0.0000 ug/kg/h | INTRAVENOUS | Status: AC
  Administered 2021-07-22: 1.2 ug/kg/h via INTRAVENOUS
  Administered 2021-07-22 (×2): 1 ug/kg/h via INTRAVENOUS
  Administered 2021-07-22: 0.4 ug/kg/h via INTRAVENOUS
  Administered 2021-07-23: 1.2 ug/kg/h via INTRAVENOUS
  Administered 2021-07-23: 1 ug/kg/h via INTRAVENOUS
  Administered 2021-07-23: 1.2 ug/kg/h via INTRAVENOUS
  Administered 2021-07-23: 1 ug/kg/h via INTRAVENOUS
  Administered 2021-07-23: 1.2 ug/kg/h via INTRAVENOUS
  Administered 2021-07-23 – 2021-07-24 (×2): 1 ug/kg/h via INTRAVENOUS
  Administered 2021-07-24 (×3): 1.2 ug/kg/h via INTRAVENOUS
  Administered 2021-07-24: 1 ug/kg/h via INTRAVENOUS
  Administered 2021-07-25: 0.4 ug/kg/h via INTRAVENOUS
  Administered 2021-07-25: 1.2 ug/kg/h via INTRAVENOUS
  Filled 2021-07-22: qty 200
  Filled 2021-07-22 (×2): qty 100
  Filled 2021-07-22 (×2): qty 200
  Filled 2021-07-22 (×2): qty 100
  Filled 2021-07-22 (×2): qty 200
  Filled 2021-07-22 (×3): qty 100

## 2021-07-22 MED ORDER — MIDAZOLAM HCL 2 MG/2ML IJ SOLN
1.0000 mg | INTRAMUSCULAR | Status: DC | PRN
  Administered 2021-07-22 – 2021-07-25 (×16): 2 mg via INTRAVENOUS
  Administered 2021-07-25: 1 mg via INTRAVENOUS
  Administered 2021-07-26: 2 mg via INTRAVENOUS
  Filled 2021-07-22 (×19): qty 2

## 2021-07-22 MED ORDER — ALBUMIN HUMAN 25 % IV SOLN
INTRAVENOUS | Status: AC
  Filled 2021-07-22: qty 50

## 2021-07-22 MED ORDER — QUETIAPINE FUMARATE 50 MG PO TABS
50.0000 mg | ORAL_TABLET | Freq: Two times a day (BID) | ORAL | Status: DC
  Administered 2021-07-22 (×2): 50 mg
  Filled 2021-07-22 (×2): qty 1

## 2021-07-22 MED ORDER — POTASSIUM CHLORIDE 20 MEQ PO PACK
20.0000 meq | PACK | Freq: Once | ORAL | Status: AC
  Administered 2021-07-22: 20 meq
  Filled 2021-07-22: qty 1

## 2021-07-22 MED ORDER — FUROSEMIDE 10 MG/ML IJ SOLN
60.0000 mg | Freq: Three times a day (TID) | INTRAMUSCULAR | Status: DC
  Administered 2021-07-22: 60 mg via INTRAVENOUS
  Filled 2021-07-22: qty 6

## 2021-07-22 MED ORDER — ARFORMOTEROL TARTRATE 15 MCG/2ML IN NEBU
15.0000 ug | INHALATION_SOLUTION | Freq: Two times a day (BID) | RESPIRATORY_TRACT | Status: DC
  Administered 2021-07-22 – 2021-07-31 (×17): 15 ug via RESPIRATORY_TRACT
  Filled 2021-07-22 (×19): qty 2

## 2021-07-22 NOTE — Progress Notes (Signed)
After weaning sedation and having pt trial PS, pt woke up agitated and bucking the vent. Pt was gagging on the ET tube. Respiratory called. Pt was also biting down on the ET tube and was uncomfortable. This RN attempted to reorient the patient and verbally calm him. Pt was still uncomfortable and was experiencing severe vent dyssynchrony. Dr. Elsworth Soho notified. Sedation increased and PRN versed push orders obtained.  ?

## 2021-07-22 NOTE — Progress Notes (Addendum)
eLink Physician-Brief Progress Note ?Patient Name: Zachary Briggs ?DOB: Oct 01, 1955 ?MRN: [id number] ? ? ?Date of Service ? 07/22/2021  ?HPI/Events of Note ? Notified of agitation.  Pt would wake up, shoots his arms up, becomes hypertensive.  Pt was taken off versed gtt today.  He is on precedex gtt and fentanyl gtts.  Pt getting versed PRN which RN says has not helped. Seroquel was started earlier today. ? ?Qtc 455  ?eICU Interventions ? Give haldol PRN.   ? ? ? ?Intervention Category ?Intermediate Interventions: Other: ? ?Elsie Lincoln ?07/22/2021, 8:16 PM ? ?10:34 PM ?Pt reportedly is able to reach for ETT.  ? ?Plan> ?Wrist restraints ordered. ? ?12:25 AM ?Pt diaphoretic, biting ET tube.  Pt also hypertensive with BP 210/85. Pt given versed, haldol and on my camera assessment, pt was already calm and sleeping.  ? ?Plan> ?Add hydralazine IV PRN.  ? ?12:49 AM ?Pt is awake, hypertensive with SBP >200s. Propofol discontinued due to high Tgs.  Pt given versed '5mg'$  which improved his BP into the 150s-160s.  Pt also settled down for a few minutes.  ? ?Plan> ?Give geodon '20mg'$  IM now.  ?If he remains agitated, will have to restart versed gtt.  ? ?1:08 AM ?ABG 7.42/54/56 on 40% FiO2.  ? ?Plan> ?Increase FiO2 to 50%.  ?Repeat CXR for ETT placement.  ?

## 2021-07-22 NOTE — Progress Notes (Signed)
Dr. Elsworth Soho and Resident physician Sanjuan Dame aware of pt's hypertension and my desire to keep patient off cardene if possible. Orders to switch sedation have been received. Will follow up if hypertension does not resolve after sedation changes. PRN labetalol given twice during my shift.  ?

## 2021-07-22 NOTE — Progress Notes (Signed)
? ?NAME:  Zachary Briggs, MRN:  [id number], DOB:  11-01-1955, LOS: 6 ?ADMISSION DATE:  07/16/2021, CONSULTATION DATE:  07/17/21 ?REFERRING MD:  IMTS, CHIEF COMPLAINT:  Shortness of breath  ? ?History of Present Illness:  ?66 yo man with hx of COPD, shortness of breath and productive cough x 2 days.   ?Admitted 5/9 AM.  Occasional chest pain.   ? Cough with yellow sputum, orthopnea.  ?Uses a walker, not terribly active at baseline.   ?Current long term smoker.   ? ?Was initially on bipap and tolerating well.  ?Trial off bipap early am, to HFNC, did not tolerate well.   ?Placed back on bipap at about 445. Anxiety concurrent with mask.  Sat dropped to 70s. Bipap titrated up to 12 over 10, gradually sat came up to high 90s.   ?Initial abg just with hypoxemia, CO2 36.   ?Started looking slightly better with higher bipap settings.   ?Trial of lasix.   ?Transported to 6E at 1130pm from ED ? ?Pertinent  Medical History  ?COPD ?HTN ?Aortic valve stenosis ?NSVT/PSVT with loop recorder ?Prostate carcinoma ?BPH  ?PTSD ?MDD ? ?Coreg, asa, lipitor, hydrodiuil, nortriptyline, protonix, zoloft, flomax  ? ?Significant Hospital Events: ?Including procedures, antibiotic start and stop dates in addition to other pertinent events   ?5/12 intubated started on neuromuscular blockade and placed in prone position ?5/14 now tolerating supine ventilation. ? ?Interim History / Subjective:  ?Needed increased sedation d/t dyssynchrony with the vent. Otherwise no acute issues. ? ?Objective   ?Blood pressure (!) 141/77, pulse 75, temperature 99.2 ?F (37.3 ?C), temperature source Oral, resp. rate (!) 25, height '5\' 6"'$  (1.676 m), weight 90.5 kg, SpO2 100 %. ?   ?Vent Mode: PRVC ?FiO2 (%):  [40 %-50 %] 40 % ?Set Rate:  [26 bmp-30 bmp] 26 bmp ?Vt Set:  [380 mL-510 mL] 510 mL ?PEEP:  [5 cmH20-12 cmH20] 5 cmH20 ?Plateau Pressure:  [17 cmH20-25 cmH20] 21 cmH20  ? ?Intake/Output Summary (Last 24 hours) at 07/22/2021 0716 ?Last data filed at 07/22/2021  0600 ?Gross per 24 hour  ?Intake 3356.02 ml  ?Output 5375 ml  ?Net -2018.98 ml  ? ? ?Filed Weights  ? 07/20/21 0500 07/21/21 0433 07/22/21 0500  ?Weight: 84.7 kg 89.7 kg 90.5 kg  ? ?Examination: ?General: Resting in bed, no acute distress, intubated.  ?HENT: NCAT. ETT, OG tube in place.  Tube feeds running. ?Lungs: Intubated, sedated. Mild bilateral raltes, no wheezing. ?Cardiovascular: Regular rate, rhythm. No murmurs appreciated.  Extremities warm and well-perfused ?Abdomen: Soft, non-tender, mildly distended. Hypoactive bowel sounds. ?Extremities: Warm, dry. No peripheral edema appreciated. ?Neuro: Sedated, non-focal.  ? ?Assessment & Plan:  ?#Acute on chronic hypoxic respiratory failure 2/2 ARDS ?#Multi-focal community acquired pneumonia, culture negative ?#Tobacco use disorder, suspicion for obstructive lung disease ?#Chronic obesity hypoventilation syndrome ?Overnight had dyssynchrony w/ vent, needed increased sedation. Off of neuromuscular blockade, now tolerating supine ventilation. Otherwise, afebrile, hemodynamically stable off pressors. P:F 160 this morning, down to PEEP 5. Respiratory and blood cultures negative. Still net positive fluids since admission - renal function stable. No clear cause of ARDS, most likely infectious, atypical vs viral.  Will wean off sedation, trial SBT today. Continue empiric steroids, antibiotics, diuretics, and bronchodilators.  ?- Linezolid, ceftriaxone (day 6), consider 7-10 day course pending clinical response ?- IV methylprednisolone '60mg'$  daily, continue slow taper down ?- Increase IV furosemide '60mg'$  three times daily ?- Daily Brovana ?- DuoNebs every 4 hours ?- Lung protective ventilation ?- VAP bundle ?-  Stress ulcer prophylaxis ?- Transition from versed to Precedex this morning, trial SBT ? ?#Hypertension ?On amlodipine, HCTZ, carvedilol, and valsartan at home. Over the weekend required nicardipine gtt. BP elevated today, SBP in 180's on rounds. Hopeful improvement  with addition of Precedex and additional dose of diuretics. If persistently elevated can add home ARB. ?- Amlodipine '10mg'$  daily ?- Carvedilol '25mg'$  twice daily ? ?#Type II diabetes mellitus. ?A1c 6.6% on admission. Likely hyperglycemic 2/2 stress, steroids. Continues to get tube feeds. Glucose mildly elevated, will adjust long-acting insulin.  ?- Increase lemevir 15 units twice daily ?- Novolog 6u every 4 hours ?- SSI ? ?#Hypernatremia ?Likely with insensible losses, high dose diuretics. Has improved to 146 this AM. Will continue with free water flushes.  ?- Free water 200cc every 4 hours  ? ?Best Practice (right click and "Reselect all SmartList Selections" daily)  ? ?Diet/type: tubefeeds ?DVT prophylaxis: LMWH ?GI prophylaxis: PPI ?Lines: Arterial Line ?Foley:  N/A ?Code Status:  full code ?Last date of multidisciplinary goals of care discussion [ n/a - will update family today ] ? ?Labs   ?CBC: ?Recent Labs  ?Lab 07/16/21 ?0805 07/16/21 ?6237 07/17/21 ?6283 07/18/21 ?0127 07/18/21 ?1528 07/19/21 ?0723 07/20/21 ?1517 07/20/21 ?6160 07/21/21 ?7371 07/21/21 ?1158 07/22/21 ?0626 07/22/21 ?0359  ?WBC 16.8*  --  21.8* 18.6*  --  18.2* 14.0*  --   --   --  15.4*  --   ?NEUTROABS 14.7*  --   --   --   --   --   --   --   --   --  13.7*  --   ?HGB 13.2   < > 13.5 11.8*   < > 12.0* 11.8* 11.9* 12.6* 11.9* 10.9* 11.2*  ?HCT 38.7*   < > 39.2 35.1*   < > 37.2* 38.6* 35.0* 37.0* 35.0* 33.1* 33.0*  ?MCV 81.1  --  80.8 80.3  --  84.9 87.5  --   --   --  82.3  --   ?PLT 203  --  241 236  --  265 272  --   --   --  290  --   ? < > = values in this interval not displayed.  ? ? ? ?Basic Metabolic Panel: ?Recent Labs  ?Lab 07/16/21 ?0915 07/16/21 ?1612 07/17/21 ?0734 07/18/21 ?0127 07/18/21 ?1528 07/19/21 ?0723 07/20/21 ?9485 07/20/21 ?4627 07/21/21 ?0350 07/21/21 ?1158 07/22/21 ?0938 07/22/21 ?0359  ?NA  --    < > 137 136   < > 137 142 144 150* 149* 146* 148*  ?K  --    < > 3.5 3.9   < > 4.7 4.9 4.5 4.3 4.1 4.1 4.1  ?CL  --    < >  105 101  --  99 104  --   --   --  107  --   ?CO2  --    < > 22 21*  --  29 32  --   --   --  30  --   ?GLUCOSE  --    < > 211* 176*  --  187* 205*  --   --   --  207*  --   ?BUN  --    < > 19 25*  --  36* 43*  --   --   --  48*  --   ?CREATININE  --    < > 1.07 0.92  --  1.11 1.17  --   --   --  1.08  --   ?CALCIUM  --    < > 8.7* 8.4*  --  7.9* 8.2*  --   --   --  8.5*  --   ?MG 1.9  --  2.3 2.4  --   --  3.3*  --   --   --   --   --   ? < > = values in this interval not displayed.  ? ? ?GFR: ?Estimated Creatinine Clearance: 71.9 mL/min (by C-G formula based on SCr of 1.08 mg/dL). ?Recent Labs  ?Lab 07/16/21 ?0805 07/16/21 ?0915 07/16/21 ?1916 07/17/21 ?6060 07/18/21 ?0127 07/19/21 ?0459 07/20/21 ?9774 07/22/21 ?1423  ?PROCALCITON  --  0.53  --   --   --   --   --   --   ?WBC 16.8*  --   --    < > 18.6* 18.2* 14.0* 15.4*  ?LATICACIDVEN 0.9  --  1.0  --   --   --   --   --   ? < > = values in this interval not displayed.  ? ? ? ?Liver Function Tests: ?Recent Labs  ?Lab 07/20/21 ?9532  ?AST 41  ?ALT 90*  ?ALKPHOS 123  ?BILITOT 0.3  ?PROT 6.3*  ?ALBUMIN 2.3*  ? ? ?No results for input(s): LIPASE, AMYLASE in the last 168 hours. ?No results for input(s): AMMONIA in the last 168 hours. ? ?ABG ?   ?Component Value Date/Time  ? PHART 7.46 (H) 07/22/2021 0233  ? PCO2ART 46 07/22/2021 0412  ? PO2ART 64 (L) 07/22/2021 0412  ? HCO3 32.8 (H) 07/22/2021 0412  ? TCO2 34 (H) 07/22/2021 0359  ? ACIDBASEDEF 1.7 07/19/2021 0735  ? O2SAT 93.8 07/22/2021 0412  ? ?  ? ?Coagulation Profile: ?Recent Labs  ?Lab 07/16/21 ?0915  ?INR 1.1  ? ? ? ?Cardiac Enzymes: ?No results for input(s): CKTOTAL, CKMB, CKMBINDEX, TROPONINI in the last 168 hours. ? ?HbA1C: ?Hgb A1c MFr Bld  ?Date/Time Value Ref Range Status  ?07/16/2021 11:49 AM 6.6 (H) 4.8 - 5.6 % Final  ?  Comment:  ?  (NOTE) ?Pre diabetes:          5.7%-6.4% ? ?Diabetes:              >6.4% ? ?Glycemic control for   <7.0% ?adults with diabetes ?  ?12/28/2015 08:35 PM 5.8 (H) 4.8 - 5.6 %  Final  ?  Comment:  ?  (NOTE) ?        Pre-diabetes: 5.7 - 6.4 ?        Diabetes: >6.4 ?        Glycemic control for adults with diabetes: <7.0 ?  ? ? ?CBG: ?Recent Labs  ?Lab 07/21/21 ?1144 07/21/21 ?1528 07/21/21

## 2021-07-23 ENCOUNTER — Inpatient Hospital Stay (HOSPITAL_COMMUNITY): Payer: Medicare Other

## 2021-07-23 DIAGNOSIS — J80 Acute respiratory distress syndrome: Secondary | ICD-10-CM | POA: Diagnosis not present

## 2021-07-23 DIAGNOSIS — J189 Pneumonia, unspecified organism: Secondary | ICD-10-CM | POA: Diagnosis not present

## 2021-07-23 LAB — CBC WITH DIFFERENTIAL/PLATELET
Abs Immature Granulocytes: 0.32 10*3/uL — ABNORMAL HIGH (ref 0.00–0.07)
Basophils Absolute: 0 10*3/uL (ref 0.0–0.1)
Basophils Relative: 0 %
Eosinophils Absolute: 0 10*3/uL (ref 0.0–0.5)
Eosinophils Relative: 0 %
HCT: 35.1 % — ABNORMAL LOW (ref 39.0–52.0)
Hemoglobin: 11.3 g/dL — ABNORMAL LOW (ref 13.0–17.0)
Immature Granulocytes: 2 %
Lymphocytes Relative: 9 %
Lymphs Abs: 1.5 10*3/uL (ref 0.7–4.0)
MCH: 27 pg (ref 26.0–34.0)
MCHC: 32.2 g/dL (ref 30.0–36.0)
MCV: 83.8 fL (ref 80.0–100.0)
Monocytes Absolute: 0.4 10*3/uL (ref 0.1–1.0)
Monocytes Relative: 3 %
Neutro Abs: 14.4 10*3/uL — ABNORMAL HIGH (ref 1.7–7.7)
Neutrophils Relative %: 86 %
Platelets: 279 10*3/uL (ref 150–400)
RBC: 4.19 MIL/uL — ABNORMAL LOW (ref 4.22–5.81)
RDW: 15.6 % — ABNORMAL HIGH (ref 11.5–15.5)
WBC: 16.8 10*3/uL — ABNORMAL HIGH (ref 4.0–10.5)
nRBC: 0 % (ref 0.0–0.2)

## 2021-07-23 LAB — BASIC METABOLIC PANEL
Anion gap: 7 (ref 5–15)
BUN: 39 mg/dL — ABNORMAL HIGH (ref 8–23)
CO2: 32 mmol/L (ref 22–32)
Calcium: 8.6 mg/dL — ABNORMAL LOW (ref 8.9–10.3)
Chloride: 105 mmol/L (ref 98–111)
Creatinine, Ser: 1.04 mg/dL (ref 0.61–1.24)
GFR, Estimated: >60 mL/min (ref >60)
Glucose, Bld: 236 mg/dL — ABNORMAL HIGH (ref 70–99)
Potassium: 3.8 mmol/L (ref 3.5–5.1)
Sodium: 144 mmol/L (ref 135–145)

## 2021-07-23 LAB — POCT I-STAT 7, (LYTES, BLD GAS, ICA,H+H)
Acid-Base Excess: 9 mmol/L — ABNORMAL HIGH (ref 0.0–2.0)
Acid-Base Excess: 12 mmol/L — ABNORMAL HIGH (ref 0.0–2.0)
Bicarbonate: 35 mmol/L — ABNORMAL HIGH (ref 20.0–28.0)
Bicarbonate: 37.4 mmol/L — ABNORMAL HIGH (ref 20.0–28.0)
Calcium, Ion: 1.18 mmol/L (ref 1.15–1.40)
Calcium, Ion: 1.16 mmol/L (ref 1.15–1.40)
HCT: 40 % (ref 39.0–52.0)
HCT: 35 % — ABNORMAL LOW (ref 39.0–52.0)
Hemoglobin: 13.6 g/dL (ref 13.0–17.0)
Hemoglobin: 11.9 g/dL — ABNORMAL LOW (ref 13.0–17.0)
O2 Saturation: 88 %
O2 Saturation: 99 %
Patient temperature: 98.5
Patient temperature: 99.5
Potassium: 4.3 mmol/L (ref 3.5–5.1)
Potassium: 3.8 mmol/L (ref 3.5–5.1)
Sodium: 147 mmol/L — ABNORMAL HIGH (ref 135–145)
Sodium: 147 mmol/L — ABNORMAL HIGH (ref 135–145)
TCO2: 37 mmol/L — ABNORMAL HIGH (ref 22–32)
TCO2: 39 mmol/L — ABNORMAL HIGH (ref 22–32)
pCO2 arterial: 54 mmHg — ABNORMAL HIGH (ref 32–48)
pCO2 arterial: 55.2 mmHg — ABNORMAL HIGH (ref 32–48)
pH, Arterial: 7.42 (ref 7.35–7.45)
pH, Arterial: 7.441 (ref 7.35–7.45)
pO2, Arterial: 56 mmHg — ABNORMAL LOW (ref 83–108)
pO2, Arterial: 154 mmHg — ABNORMAL HIGH (ref 83–108)

## 2021-07-23 LAB — GLUCOSE, CAPILLARY
Glucose-Capillary: 198 mg/dL — ABNORMAL HIGH (ref 70–99)
Glucose-Capillary: 190 mg/dL — ABNORMAL HIGH (ref 70–99)
Glucose-Capillary: 99 mg/dL (ref 70–99)
Glucose-Capillary: 158 mg/dL — ABNORMAL HIGH (ref 70–99)
Glucose-Capillary: 177 mg/dL — ABNORMAL HIGH (ref 70–99)
Glucose-Capillary: 115 mg/dL — ABNORMAL HIGH (ref 70–99)

## 2021-07-23 LAB — MAGNESIUM: Magnesium: 2.2 mg/dL (ref 1.7–2.4)

## 2021-07-23 LAB — TRIGLYCERIDES: Triglycerides: 96 mg/dL (ref <150)

## 2021-07-23 LAB — PROCALCITONIN: Procalcitonin: 0.1 ng/mL

## 2021-07-23 MED ORDER — STERILE WATER FOR INJECTION IJ SOLN
INTRAMUSCULAR | Status: AC
Start: 2021-07-23 — End: 2021-07-23
  Filled 2021-07-23: qty 10

## 2021-07-23 MED ORDER — MIDAZOLAM HCL 2 MG/2ML IJ SOLN
INTRAMUSCULAR | Status: AC
  Filled 2021-07-23: qty 6

## 2021-07-23 MED ORDER — HYDRALAZINE HCL 50 MG PO TABS: 25.0000 mg | ORAL_TABLET | Freq: Four times a day (QID) | ORAL | Status: DC

## 2021-07-23 MED ORDER — MIDAZOLAM HCL 2 MG/2ML IJ SOLN
5.0000 mg | Freq: Once | INTRAMUSCULAR | Status: AC
  Administered 2021-07-23: 5 mg via INTRAVENOUS

## 2021-07-23 MED ORDER — CLONIDINE HCL 0.1 MG PO TABS
0.1000 mg | ORAL_TABLET | Freq: Two times a day (BID) | ORAL | Status: DC
  Administered 2021-07-23 – 2021-07-28 (×12): 0.1 mg
  Filled 2021-07-23 (×12): qty 1

## 2021-07-23 MED ORDER — CLONAZEPAM 1 MG PO TABS
1.0000 mg | ORAL_TABLET | Freq: Two times a day (BID) | ORAL | Status: DC
  Filled 2021-07-23: qty 1

## 2021-07-23 MED ORDER — PREDNISONE 20 MG PO TABS
40.0000 mg | ORAL_TABLET | Freq: Every day | ORAL | Status: DC
  Filled 2021-07-23: qty 2

## 2021-07-23 MED ORDER — OXYCODONE HCL 5 MG PO TABS
10.0000 mg | ORAL_TABLET | Freq: Four times a day (QID) | ORAL | Status: DC
  Administered 2021-07-23 – 2021-07-24 (×4): 10 mg
  Filled 2021-07-23 (×5): qty 2

## 2021-07-23 MED ORDER — INSULIN ASPART 100 UNIT/ML IJ SOLN
8.0000 [IU] | INTRAMUSCULAR | Status: DC
  Administered 2021-07-23 – 2021-07-30 (×39): 8 [IU] via SUBCUTANEOUS

## 2021-07-23 MED ORDER — POTASSIUM CHLORIDE 20 MEQ PO PACK
40.0000 meq | PACK | Freq: Once | ORAL | Status: AC
  Administered 2021-07-23: 40 meq
  Filled 2021-07-23: qty 2

## 2021-07-23 MED ORDER — FUROSEMIDE 10 MG/ML IJ SOLN
60.0000 mg | Freq: Three times a day (TID) | INTRAMUSCULAR | Status: DC
  Administered 2021-07-23 – 2021-07-26 (×9): 60 mg via INTRAVENOUS
  Filled 2021-07-23 (×9): qty 6

## 2021-07-23 MED ORDER — PREDNISONE 20 MG PO TABS
40.0000 mg | ORAL_TABLET | Freq: Every day | ORAL | Status: DC
  Administered 2021-07-23 – 2021-07-24 (×2): 40 mg
  Filled 2021-07-23: qty 2

## 2021-07-23 MED ORDER — QUETIAPINE FUMARATE 100 MG PO TABS
100.0000 mg | ORAL_TABLET | Freq: Two times a day (BID) | ORAL | Status: DC
  Administered 2021-07-23 (×2): 100 mg
  Filled 2021-07-23 (×2): qty 1

## 2021-07-23 MED ORDER — HYDRALAZINE HCL 20 MG/ML IJ SOLN
20.0000 mg | Freq: Four times a day (QID) | INTRAMUSCULAR | Status: DC | PRN
  Administered 2021-07-23 (×2): 20 mg via INTRAVENOUS
  Filled 2021-07-23 (×2): qty 1

## 2021-07-23 MED ORDER — CLONAZEPAM 1 MG PO TABS
1.0000 mg | ORAL_TABLET | Freq: Two times a day (BID) | ORAL | Status: DC
  Administered 2021-07-23 (×2): 1 mg
  Filled 2021-07-23: qty 1

## 2021-07-23 MED ORDER — ZIPRASIDONE MESYLATE 20 MG IM SOLR
20.0000 mg | Freq: Once | INTRAMUSCULAR | Status: AC
  Administered 2021-07-23: 20 mg via INTRAMUSCULAR
  Filled 2021-07-23: qty 20

## 2021-07-23 MED ORDER — INSULIN DETEMIR 100 UNIT/ML ~~LOC~~ SOLN
18.0000 [IU] | Freq: Two times a day (BID) | SUBCUTANEOUS | Status: DC
  Administered 2021-07-23 – 2021-07-29 (×14): 18 [IU] via SUBCUTANEOUS
  Filled 2021-07-23 (×16): qty 0.18

## 2021-07-23 NOTE — TOC Initial Note (Addendum)
Transition of Care (TOC) - Initial/Assessment Note  ? ? ?Patient Details  ?Name: Zachary Briggs ?MRN: [id number] ?Date of Birth: [birth year] ? ?Transition of Care (TOC) CM/SW Contact:    ?Angelita Ingles, RN ?Phone [phone] ? ?07/23/2021, 11:58 AM ? ?Clinical Narrative:                 ? ?Transition of Care (TOC) Screening Note ? ? ?Patient Details  ?Name: Zachary Briggs ?Date of Birth: 05/18/1955 ? ? ?Transition of Care (TOC) CM/SW Contact:    ?Angelita Ingles, RN ?Phone Number: ?07/23/2021, 11:58 AM ? ? ? ?Transition of Care Department [facility]) has reviewed patient and no TOC needs have been identified at this time. We will continue to monitor patient advancement through interdisciplinary progression rounds. High risk for readmission. Patient currently on vent with no family at bedside. TOC will continue to follow.  ? ? ? ?  ?  ? ? ?Patient Goals and CMS Choice ?  ?  ?  ? ?Expected Discharge Plan and Services ?  ?  ?  ?  ?  ?                ?  ?  ?  ?  ?  ?  ?  ?  ?  ?  ? ?Prior Living Arrangements/Services ?  ?  ?  ?       ?  ?  ?  ?  ? ?Activities of Daily Living ?Home Assistive Devices/Equipment: None ?ADL Screening (condition at time of admission) ?Patient's cognitive ability adequate to safely complete daily activities?: Yes ?Is the patient deaf or have difficulty hearing?: No ?Does the patient have difficulty seeing, even when wearing glasses/contacts?: No ?Does the patient have difficulty concentrating, remembering, or making decisions?: No ?Patient able to express need for assistance with ADLs?: Yes ?Does the patient have difficulty dressing or bathing?: No ?Independently performs ADLs?: Yes (appropriate for developmental age) ?Does the patient have difficulty walking or climbing stairs?: No ?Weakness of Legs: Both ?Weakness of Arms/Hands: None ? ?Permission Sought/Granted ?  ?  ?   ?   ?   ?   ? ?Emotional Assessment ?  ?  ?  ?  ?  ?  ? ?Admission diagnosis:  Hypokalemia [E87.6] ?Acute respiratory  failure with hypoxia ([place]) [J96.01] ?Community acquired pneumonia, unspecified laterality [J18.9] ?Patient Active Problem List  ? Diagnosis Date Noted  ? Community acquired pneumonia   ? Hypernatremia 07/21/2021  ? ARDS (adult respiratory distress syndrome) ([place]) 07/20/2021  ? Acute on chronic respiratory failure with hypoxia and hypercapnia ([place]) 07/20/2021  ? Obesity (BMI 30-39.9) 07/20/2021  ? Type 2 diabetes mellitus with hyperglycemia ([place]) 07/20/2021  ? Aortic stenosis 12/30/2015  ? Aortic atherosclerosis ([place]) 12/29/2015  ? [place] of lung ([place]) 12/29/2015  ? Chest pain 12/28/2015  ? Back pain 12/28/2015  ? Hypertension   ? Tobacco abuse   ? ?PCP:  [place]:   ?[facility] [id number]?Phone: [phone]: [phone]? ?[place] [address]?Phone: [phone]: [phone]? ? ? ? ?Social Determinants of Health (SDOH) Interventions ?  ? ?Readmission Risk Interventions ?   ? View : No data to display.  ?  ?  ?  ? ? ? ?

## 2021-07-23 NOTE — Progress Notes (Signed)
66 year old smoker with COPD admitted with ARDS due to multifocal community-acquired pneumonia. ?Required neuromuscular blockade and prone positioning, hypoxia is now improving ? ?5/15 increase diuresis and switch sedation to Precedex, Versed drip was turned off. ?He developed increased agitation. ?Seroquel and oxycodone was added in general. ? ?Afebrile ?Remains with intermittent agitation requiring increasing fentanyl drip. ?Good urine output with increased dose of Lasix ?On exam -no accessory muscle use, eyes open and anxious appearing , faint bilateral expiratory rhonchi, minimal secretions, S1-S2 regular, no edema. ? ?Chest x-ray independently reviewed shows faint bilateral interstitial infiltrates. ? ?ABG shows compensated mild hypercarbia. ?Labs show hyperglycemia, normal electrolytes, mild leukocytosis ? ?Impression/plan ?ARDS -PF ratio has improved. ?Start spontaneous breathing trials, mental status and severe agitation main barrier to extubation ?-Continue diuresis with Lasix for negative balance ? ?Acute encephalopathy -increase Seroquel 200 twice daily, continue Precedex and fentanyl drips, use Versed for breakthrough, increase oxycodone to 10 every 6. ? ?Multifocal CAP -respiratory culture and urine strep antigen negative. ?Can discontinue ceftriaxone/linezolid after 7 days ?COPD exacerbation -bronchospasm appears to be resolving, can taper steroids, continue Pulmicort and Brovana ? ?Hypernatremia -improving with free water, monitor while on Lasix. ? ?Family updated. ? ?My independent critical care time was 34 minutes ? ?Leanna Sato Elsworth Soho MD ? ?

## 2021-07-23 NOTE — Progress Notes (Signed)
? ?NAME:  Zachary Briggs, MRN:  [id number], DOB:  1955/09/27, LOS: 7 ?ADMISSION DATE:  07/16/2021, CONSULTATION DATE:  07/17/21 ?REFERRING MD:  IMTS, CHIEF COMPLAINT:  Shortness of breath  ? ?History of Present Illness:  ?66 yo man with hx of COPD, shortness of breath and productive cough x 2 days.   ?Admitted 5/9 AM.  Occasional chest pain.   ? Cough with yellow sputum, orthopnea.  ?Uses a walker, not terribly active at baseline.   ?Current long term smoker.   ? ?Was initially on bipap and tolerating well.  ?Trial off bipap early am, to HFNC, did not tolerate well.   ?Placed back on bipap at about 445. Anxiety concurrent with mask.  Sat dropped to 70s. Bipap titrated up to 12 over 10, gradually sat came up to high 90s.   ?Initial abg just with hypoxemia, CO2 36.   ?Started looking slightly better with higher bipap settings.   ?Trial of lasix.   ?Transported to 6E at 1130pm from ED ? ?Pertinent  Medical History  ?COPD ?HTN ?Aortic valve stenosis ?NSVT/PSVT with loop recorder ?Prostate carcinoma ?BPH  ?PTSD ?MDD ? ?Coreg, asa, lipitor, hydrodiuil, nortriptyline, protonix, zoloft, flomax  ? ?Significant Hospital Events: ?Including procedures, antibiotic start and stop dates in addition to other pertinent events   ?5/12 intubated started on neuromuscular blockade and placed in prone position ?5/14 now tolerating supine ventilation. ? ?Interim History / Subjective:  ?Had more agitation yesterday, added oral sedation medications. Hypertensive overnight, gave IV hydralazine with improvement of BP. ? ?Objective   ?Blood pressure 113/70, pulse 65, temperature 99.5 ?F (37.5 ?C), temperature source Oral, resp. rate 18, height '5\' 6"'$  (1.676 m), weight 88.2 kg, SpO2 100 %. ?   ?Vent Mode: PRVC ?FiO2 (%):  [40 %-60 %] 60 % ?Set Rate:  [20 bmp-26 bmp] 20 bmp ?Vt Set:  [510 mL] 510 mL ?PEEP:  [5 cmH20] 5 cmH20 ?Pressure Support:  [10 cmH20] 10 cmH20 ?Plateau Pressure:  [18 cmH20-23 cmH20] 20 cmH20  ? ?Intake/Output Summary (Last 24  hours) at 07/23/2021 0656 ?Last data filed at 07/23/2021 0620 ?Gross per 24 hour  ?Intake 4377.32 ml  ?Output 5350 ml  ?Net -972.68 ml  ? ? ?Filed Weights  ? 07/21/21 0433 07/22/21 0500 07/23/21 0340  ?Weight: 89.7 kg 90.5 kg 88.2 kg  ? ?Examination: ?General: Resting in bed, no acute distress, intubated.  ?HENT: NCAT. ETT, OG tube in place.  Tube feeds running. ?Lungs: Intubated, sedated. Mild bilateral raltes, no wheezing. ?Cardiovascular: Regular rate, rhythm. No murmurs appreciated.  Extremities warm and well-perfused ?Abdomen: Soft, non-tender, mildly distended. Hypoactive bowel sounds. ?Extremities: Warm, dry. No peripheral edema appreciated. ?Neuro: Sedated, non-focal.  ? ?Assessment & Plan:  ?#Acute on chronic hypoxic respiratory failure 2/2 ARDS ?#Multi-focal community acquired pneumonia, culture negative ?#Tobacco use disorder, suspicion for obstructive lung disease ?#Chronic obesity hypoventilation syndrome ?When trying to wean off sedation yesterday, patient was significantly agitated. At that time added on oral medications for supplementation, will increase these today. P:F ratio holding ~150 this morning, PEEP 5. Now net negative fluids for admission. Will get procal in AM, today is day 7 of antibiotics.  ?- Linezolid, ceftriaxone (day 7) - will get procal in AM ?- Switch to prednisone '40mg'$  daily ?- Continue IV furosemide '60mg'$  three times daily ?- Bronchodilators ?- Increase Seroquel '100mg'$  twice daily (checking QTc today) ?- Increase oxycodone '10mg'$  every 6 hours ?- Lung protective ventilation ?- VAP bundle ?- Stress ulcer prophylaxis ? ?#Hypertension ?BP significantly elevated >  200 and was given IV hydralazine. Systolic BP dropped to 614. Would avoid IV hydralazine, given variability and significant drop. Will also avoid ARB given we are continuing to diurese. Plan to initiate clonidine today, can consider addition of po hydralazine as well. ?- Start clonidine 0.'1mg'$  twice daily ?- Amlodipine '10mg'$   daily ?- Carvedilol '25mg'$  twice daily ? ?#Type II diabetes mellitus. ?A1c 6.6% on admission. Likely hyperglycemic 2/2 stress, steroids. Continues to get tube feeds. Decreasing steroids today, which should help. Will also adjust insulin regimen.  ?- Increase lemevir 18 units twice daily ?- Increase novolog 8 units every 4 hours ?- SSI ? ?#Acute normocytic anemia ?No previous hx of anemia or renal disease. Most likely due to acute critical illness. No signs or symptoms of bleeding.  ? ?Best Practice (right click and "Reselect all SmartList Selections" daily)  ? ?Diet/type: tubefeeds ?DVT prophylaxis: LMWH ?GI prophylaxis: PPI ?Lines: Arterial Line ?Foley:  N/A ?Code Status:  full code ?Last date of multidisciplinary goals of care discussion [ n/a - will update family today ] ? ?Labs   ?CBC: ?Recent Labs  ?Lab 07/16/21 ?0805 07/16/21 ?4315 07/18/21 ?0127 07/18/21 ?1528 07/19/21 ?0723 07/20/21 ?4008 07/20/21 ?6761 07/22/21 ?9509 07/22/21 ?0359 07/23/21 ?3267 07/23/21 ?0343 07/23/21 ?1245  ?WBC 16.8*   < > 18.6*  --  18.2* 14.0*  --  15.4*  --   --  16.8*  --   ?NEUTROABS 14.7*  --   --   --   --   --   --  13.7*  --   --  14.4*  --   ?HGB 13.2   < > 11.8*   < > 12.0* 11.8*   < > 10.9* 11.2* 13.6 11.3* 11.9*  ?HCT 38.7*   < > 35.1*   < > 37.2* 38.6*   < > 33.1* 33.0* 40.0 35.1* 35.0*  ?MCV 81.1   < > 80.3  --  84.9 87.5  --  82.3  --   --  83.8  --   ?PLT 203   < > 236  --  265 272  --  290  --   --  279  --   ? < > = values in this interval not displayed.  ? ? ?Basic Metabolic Panel: ?Recent Labs  ?Lab 07/16/21 ?0915 07/16/21 ?1612 07/17/21 ?0734 07/18/21 ?0127 07/18/21 ?1528 07/19/21 ?0723 07/20/21 ?8099 07/20/21 ?8338 07/22/21 ?2505 07/22/21 ?0359 07/23/21 ?3976 07/23/21 ?0343 07/23/21 ?7341  ?NA  --    < > 137 136   < > 137 142   < > 146* 148* 147* 144 147*  ?K  --    < > 3.5 3.9   < > 4.7 4.9   < > 4.1 4.1 4.3 3.8 3.8  ?CL  --    < > 105 101  --  99 104  --  107  --   --  105  --   ?CO2  --    < > 22 21*  --  29 32   --  30  --   --  32  --   ?GLUCOSE  --    < > 211* 176*  --  187* 205*  --  207*  --   --  236*  --   ?BUN  --    < > 19 25*  --  36* 43*  --  48*  --   --  39*  --   ?CREATININE  --    < >  1.07 0.92  --  1.11 1.17  --  1.08  --   --  1.04  --   ?CALCIUM  --    < > 8.7* 8.4*  --  7.9* 8.2*  --  8.5*  --   --  8.6*  --   ?MG 1.9  --  2.3 2.4  --   --  3.3*  --   --   --   --  2.2  --   ? < > = values in this interval not displayed.  ? ? ?GFR: ?Estimated Creatinine Clearance: 73.7 mL/min (by C-G formula based on SCr of 1.04 mg/dL). ?Recent Labs  ?Lab 07/16/21 ?0805 07/16/21 ?0915 07/16/21 ?1638 07/17/21 ?4665 07/19/21 ?9935 07/20/21 ?7017 07/22/21 ?7939 07/23/21 ?0343  ?PROCALCITON  --  0.53  --   --   --   --   --   --   ?WBC 16.8*  --   --    < > 18.2* 14.0* 15.4* 16.8*  ?LATICACIDVEN 0.9  --  1.0  --   --   --   --   --   ? < > = values in this interval not displayed.  ? ? ? ?Liver Function Tests: ?Recent Labs  ?Lab 07/20/21 ?0300  ?AST 41  ?ALT 90*  ?ALKPHOS 123  ?BILITOT 0.3  ?PROT 6.3*  ?ALBUMIN 2.3*  ? ? ?No results for input(s): LIPASE, AMYLASE in the last 168 hours. ?No results for input(s): AMMONIA in the last 168 hours. ? ?ABG ?   ?Component Value Date/Time  ? PHART 7.441 07/23/2021 0513  ? PCO2ART 55.2 (H) 07/23/2021 0513  ? PO2ART 154 (H) 07/23/2021 0513  ? HCO3 37.4 (H) 07/23/2021 0513  ? TCO2 39 (H) 07/23/2021 0513  ? ACIDBASEDEF 1.7 07/19/2021 0735  ? O2SAT 99 07/23/2021 0513  ? ?  ?Coagulation Profile: ?Recent Labs  ?Lab 07/16/21 ?0915  ?INR 1.1  ? ? ?Cardiac Enzymes: ?No results for input(s): CKTOTAL, CKMB, CKMBINDEX, TROPONINI in the last 168 hours. ? ?HbA1C: ?Hgb A1c MFr Bld  ?Date/Time Value Ref Range Status  ?07/16/2021 11:49 AM 6.6 (H) 4.8 - 5.6 % Final  ?  Comment:  ?  (NOTE) ?Pre diabetes:          5.7%-6.4% ? ?Diabetes:              >6.4% ? ?Glycemic control for   <7.0% ?adults with diabetes ?  ?12/28/2015 08:35 PM 5.8 (H) 4.8 - 5.6 % Final  ?  Comment:  ?  (NOTE) ?        Pre-diabetes: 5.7 -  6.4 ?        Diabetes: >6.4 ?        Glycemic control for adults with diabetes: <7.0 ?  ? ?CBG: ?Recent Labs  ?Lab 07/22/21 ?1129 07/22/21 ?1506 07/22/21 ?1926 07/22/21 ?2320 07/23/21 ?0320  ?GLUCAP 161* 125

## 2021-07-24 ENCOUNTER — Inpatient Hospital Stay (HOSPITAL_COMMUNITY): Payer: Medicare Other

## 2021-07-24 DIAGNOSIS — J80 Acute respiratory distress syndrome: Secondary | ICD-10-CM | POA: Diagnosis not present

## 2021-07-24 DIAGNOSIS — J189 Pneumonia, unspecified organism: Secondary | ICD-10-CM | POA: Diagnosis not present

## 2021-07-24 LAB — BASIC METABOLIC PANEL
Anion gap: 10 (ref 5–15)
BUN: 42 mg/dL — ABNORMAL HIGH (ref 8–23)
CO2: 33 mmol/L — ABNORMAL HIGH (ref 22–32)
Calcium: 8.7 mg/dL — ABNORMAL LOW (ref 8.9–10.3)
Chloride: 102 mmol/L (ref 98–111)
Creatinine, Ser: 1.08 mg/dL (ref 0.61–1.24)
GFR, Estimated: >60 mL/min (ref >60)
Glucose, Bld: 236 mg/dL — ABNORMAL HIGH (ref 70–99)
Potassium: 4.8 mmol/L (ref 3.5–5.1)
Sodium: 145 mmol/L (ref 135–145)

## 2021-07-24 LAB — GLUCOSE, CAPILLARY
Glucose-Capillary: 201 mg/dL — ABNORMAL HIGH (ref 70–99)
Glucose-Capillary: 132 mg/dL — ABNORMAL HIGH (ref 70–99)
Glucose-Capillary: 74 mg/dL (ref 70–99)
Glucose-Capillary: 145 mg/dL — ABNORMAL HIGH (ref 70–99)
Glucose-Capillary: 173 mg/dL — ABNORMAL HIGH (ref 70–99)

## 2021-07-24 LAB — COMPREHENSIVE METABOLIC PANEL
ALT: 56 U/L — ABNORMAL HIGH (ref 0–44)
AST: 39 U/L (ref 15–41)
Albumin: 2.3 g/dL — ABNORMAL LOW (ref 3.5–5.0)
Alkaline Phosphatase: 85 U/L (ref 38–126)
Anion gap: 9 (ref 5–15)
BUN: 38 mg/dL — ABNORMAL HIGH (ref 8–23)
CO2: 33 mmol/L — ABNORMAL HIGH (ref 22–32)
Calcium: 8.6 mg/dL — ABNORMAL LOW (ref 8.9–10.3)
Chloride: 105 mmol/L (ref 98–111)
Creatinine, Ser: 0.99 mg/dL (ref 0.61–1.24)
GFR, Estimated: >60 mL/min (ref >60)
Glucose, Bld: 144 mg/dL — ABNORMAL HIGH (ref 70–99)
Potassium: 3.7 mmol/L (ref 3.5–5.1)
Sodium: 147 mmol/L — ABNORMAL HIGH (ref 135–145)
Total Bilirubin: 0.5 mg/dL (ref 0.3–1.2)
Total Protein: 6.1 g/dL — ABNORMAL LOW (ref 6.5–8.1)

## 2021-07-24 LAB — CBC WITH DIFFERENTIAL/PLATELET
Abs Immature Granulocytes: 0.21 10*3/uL — ABNORMAL HIGH (ref 0.00–0.07)
Basophils Absolute: 0 10*3/uL (ref 0.0–0.1)
Basophils Relative: 0 %
Eosinophils Absolute: 0.1 10*3/uL (ref 0.0–0.5)
Eosinophils Relative: 0 %
HCT: 36.1 % — ABNORMAL LOW (ref 39.0–52.0)
Hemoglobin: 11.7 g/dL — ABNORMAL LOW (ref 13.0–17.0)
Immature Granulocytes: 1 %
Lymphocytes Relative: 13 %
Lymphs Abs: 2.3 10*3/uL (ref 0.7–4.0)
MCH: 27.1 pg (ref 26.0–34.0)
MCHC: 32.4 g/dL (ref 30.0–36.0)
MCV: 83.8 fL (ref 80.0–100.0)
Monocytes Absolute: 0.8 10*3/uL (ref 0.1–1.0)
Monocytes Relative: 5 %
Neutro Abs: 13.9 10*3/uL — ABNORMAL HIGH (ref 1.7–7.7)
Neutrophils Relative %: 81 %
Platelets: 269 10*3/uL (ref 150–400)
RBC: 4.31 MIL/uL (ref 4.22–5.81)
RDW: 15.5 % (ref 11.5–15.5)
WBC: 17.2 10*3/uL — ABNORMAL HIGH (ref 4.0–10.5)
nRBC: 0 % (ref 0.0–0.2)

## 2021-07-24 LAB — PROCALCITONIN: Procalcitonin: <0.1 ng/mL

## 2021-07-24 MED ORDER — POTASSIUM CHLORIDE 20 MEQ PO PACK
40.0000 meq | PACK | Freq: Once | ORAL | Status: AC
  Administered 2021-07-24: 40 meq
  Filled 2021-07-24: qty 2

## 2021-07-24 MED ORDER — SODIUM CHLORIDE 0.9% FLUSH: 10.0000 mL | INTRAVENOUS | Status: DC | PRN

## 2021-07-24 MED ORDER — IPRATROPIUM-ALBUTEROL 0.5-2.5 (3) MG/3ML IN SOLN
3.0000 mL | Freq: Two times a day (BID) | RESPIRATORY_TRACT | Status: DC
  Administered 2021-07-24 – 2021-07-30 (×12): 3 mL via RESPIRATORY_TRACT
  Filled 2021-07-24 (×11): qty 3

## 2021-07-24 MED ORDER — OXYCODONE HCL 5 MG PO TABS
10.0000 mg | ORAL_TABLET | Freq: Four times a day (QID) | ORAL | Status: DC
Start: 2021-07-24 — End: 2021-07-27
  Administered 2021-07-24 – 2021-07-27 (×12): 10 mg
  Filled 2021-07-24 (×12): qty 2

## 2021-07-24 MED ORDER — PANTOPRAZOLE 2 MG/ML SUSPENSION
40.0000 mg | Freq: Every day | ORAL | Status: DC
  Administered 2021-07-24 – 2021-07-31 (×8): 40 mg
  Filled 2021-07-24 (×8): qty 20

## 2021-07-24 MED ORDER — QUETIAPINE FUMARATE 100 MG PO TABS
200.0000 mg | ORAL_TABLET | Freq: Two times a day (BID) | ORAL | Status: DC
  Administered 2021-07-24 – 2021-07-27 (×8): 200 mg
  Filled 2021-07-24 (×8): qty 2

## 2021-07-24 MED ORDER — CLONAZEPAM 1 MG PO TABS
2.0000 mg | ORAL_TABLET | Freq: Two times a day (BID) | ORAL | Status: DC
  Administered 2021-07-24 – 2021-07-27 (×7): 2 mg
  Filled 2021-07-24 (×7): qty 2

## 2021-07-24 MED ORDER — OXYCODONE HCL 5 MG PO TABS
10.0000 mg | ORAL_TABLET | Freq: Four times a day (QID) | ORAL | Status: DC
  Administered 2021-07-24: 10 mg via ORAL
  Filled 2021-07-24: qty 2

## 2021-07-24 MED ORDER — SODIUM CHLORIDE 0.9% FLUSH
10.0000 mL | Freq: Two times a day (BID) | INTRAVENOUS | Status: DC
  Administered 2021-07-24 – 2021-07-28 (×8): 10 mL
  Administered 2021-07-29: 20 mL
  Administered 2021-07-29 – 2021-07-30 (×3): 10 mL

## 2021-07-24 NOTE — Progress Notes (Signed)
[facility] ADULT ICU REPLACEMENT PROTOCOL ? ? ?The patient does apply for the [facility] Adult ICU Electrolyte Replacment Protocol based on the criteria listed below:  ? ?1.Exclusion criteria: TCTS patients, ECMO patients, and Dialysis patients ?2. Is GFR >/= 30 ml/min? Yes.    ?Patient's GFR today is >60 ?3. Is SCr </= 2? Yes.   ?Patient's SCr is 0.99 mg/dL ?4. Did SCr increase >/= 0.5 in 24 hours? No. ?5.Pt's weight >40kg  Yes.   ?6. Abnormal electrolyte(s):   K 3.7  ?7. Electrolytes replaced per protocol ?8.  Call MD STAT for K+ </= 2.5, Phos </= 1, or Mag </= 1 ?Physician:  Eddie Candle ? ?Sedonia Small 07/24/2021 4:26 AM ? ?

## 2021-07-24 NOTE — Progress Notes (Signed)

## 2021-07-24 NOTE — Procedures (Signed)
Cortrak ? ?Tube Type:  Cortrak - 43 inches ?Tube Location:  Left nare ?Initial Placement:  Stomach ?Secured by: Dorann Lodge ?Technique Used to Measure Tube Placement:  Marking at nare/corner of mouth ?Cortrak Secured At:  70 cm ? ?Cortrak Tube Team Note: ? ?Consult received to unclog Cortrak feeding tube. Feeding tube noted to be kinked at the end. Tube was adjusted without difficulty and flushes well.  ? ?X-ray is required, abdominal x-ray has been ordered by the Cortrak team. Please confirm tube placement before using the Cortrak tube.  ? ?If the tube becomes dislodged please keep the tube and contact the Cortrak team at [email or web address] (password [email or web address]) for replacement.  ?If after hours and replacement cannot be delayed, place a NG tube and confirm placement with an abdominal x-ray.  ? ? ?Koleen Distance MS, RD, LDN ?Please refer to [facility] for RD and/or RD on-call/weekend/after hours pager ? ? ?

## 2021-07-24 NOTE — Progress Notes (Addendum)
? ?NAME:  Zachary Briggs, MRN:  [id number], DOB:  1955/07/09, LOS: 8 ?ADMISSION DATE:  07/16/2021, CONSULTATION DATE:  07/17/21 ?REFERRING MD:  IMTS, CHIEF COMPLAINT:  Shortness of breath  ? ?History of Present Illness:  ?66 yo man with hx of COPD, shortness of breath and productive cough x 2 days.   ?Admitted 5/9 AM.  Occasional chest pain.   ? Cough with yellow sputum, orthopnea.  ?Uses a walker, not terribly active at baseline.   ?Current long term smoker.   ? ?Was initially on bipap and tolerating well.  ?Trial off bipap early am, to HFNC, did not tolerate well.   ?Placed back on bipap at about 445. Anxiety concurrent with mask.  Sat dropped to 70s. Bipap titrated up to 12 over 10, gradually sat came up to high 90s.   ?Initial abg just with hypoxemia, CO2 36.   ?Started looking slightly better with higher bipap settings.   ?Trial of lasix.   ?Transported to 6E at 1130pm from ED ? ?Pertinent  Medical History  ?COPD ?HTN ?Aortic valve stenosis ?NSVT/PSVT with loop recorder ?Prostate carcinoma ?BPH  ?PTSD ?MDD ? ?Coreg, asa, lipitor, hydrodiuil, nortriptyline, protonix, zoloft, flomax  ? ?Significant Hospital Events: ?Including procedures, antibiotic start and stop dates in addition to other pertinent events   ?5/12 intubated started on neuromuscular blockade and placed in prone position ?5/14 now tolerating supine ventilation. ?5/16 extremely agitated, requiring high doses of sedation ? ?Interim History / Subjective:  ?Very agitated overnight, continues to need high dosages of sedation.  ? ?Objective   ?Blood pressure (!) 182/105, pulse 93, temperature 98.3 ?F (36.8 ?C), temperature source Axillary, resp. rate (!) 25, height '5\' 6"'$  (1.676 m), weight 86.3 kg, SpO2 100 %. ?   ?Vent Mode: PRVC ?FiO2 (%):  [40 %] 40 % ?Set Rate:  [20 bmp] 20 bmp ?Vt Set:  [510 mL] 510 mL ?PEEP:  [5 cmH20] 5 cmH20 ?Pressure Support:  [10 cmH20] 10 cmH20 ?Plateau Pressure:  [15 cmH20-22 cmH20] 19 cmH20  ? ?Intake/Output Summary (Last 24  hours) at 07/24/2021 0741 ?Last data filed at 07/24/2021 0700 ?Gross per 24 hour  ?Intake 3554.6 ml  ?Output 5200 ml  ?Net -1645.4 ml  ? ? ?Filed Weights  ? 07/22/21 0500 07/23/21 0340 07/24/21 0410  ?Weight: 90.5 kg 88.2 kg 86.3 kg  ? ?Examination: ?General: Resting in bed, no acute distress, intubated.  ?HENT: Normocephalic, atraumatic. ETT, OG tube in place.  Tube feeds running.  ?Lungs: Intubated, sedated. Mild bilateral raltes, no wheezing. ?Cardiovascular: Regular rate, rhythm. No murmurs appreciated.  Extremities warm and well-perfused ?Abdomen: Soft, non-tender, mildly distended. Hypoactive bowel sounds. ?Extremities: Warm, dry. No peripheral edema appreciated. ?Neuro: Sedated, non-focal. Eyes open but not following commands.  ? ?Assessment & Plan:  ?#Acute on chronic hypoxic respiratory failure 2/2 ARDS ?#Multi-focal community acquired pneumonia, culture negative ?#Tobacco use disorder, suspicion for obstructive lung disease ?#Chronic obesity hypoventilation syndrome ?Improving from respiratory standpoint, ventilation settings this morning w/ FiO2 40% w/ PEEP 5. Procal this AM <0.1, antibiotics have been discontinued. With continued significant agitation, likely won't be safe for extubation today.  ?- Lung protective ventilation ?- VAP bundle ?- Stress ulcer prophylaxis ?- Steroid taper, prednisone '40mg'$  today ?- IV furosemide '60mg'$  three times daily ?- Bronchodilators ? ?#Acute metabolic encephalopathy ?Continues to have severe agitation, not following commands this morning. Overnight had to have haldol and multiple boluses of fentanyl and versed overnight to control symptoms. Will adjust oral medications today. ?- Increase Seroquel '200mg'$   twice daily, Qtc 448 yesterday ?- Increase Klonopin '2mg'$  twice daily ?- Oxycodone '10mg'$  every 6 hours ? ?#Hypertension ?BP variable overnight, likely related to sedation. Will continue with current medications.  ?- Clonidine 0.'1mg'$  twice daily ?- Amlodipine '10mg'$  daily ?-  Carvedilol '25mg'$  twice daily ? ?#Type II diabetes mellitus. ?A1c 6.6% on admission. Likely hyperglycemic 2/2 stress, steroids. Continues to get tube feeds. Sugars improved with adjustment of sugars yesterday, will continue with current regimen.  ?- Lemevir 18 units twice daily ?- Novolog 8 units every 4 hours ?- SSI ? ?#Acute normocytic anemia ?No previous hx of anemia or renal disease. Most likely due to acute critical illness. No signs or symptoms of bleeding.  ? ?Best Practice (right click and "Reselect all SmartList Selections" daily)  ? ?Diet/type: tubefeeds ?DVT prophylaxis: LMWH ?GI prophylaxis: PPI ?Lines: Arterial Line ?Foley:  N/A ?Code Status:  full code ?Last date of multidisciplinary goals of care discussion [ family updated at bedside ] ? ?Labs   ?CBC: ?Recent Labs  ?Lab 07/19/21 ?0723 07/20/21 ?1950 07/20/21 ?9326 07/22/21 ?7124 07/22/21 ?0359 07/23/21 ?5809 07/23/21 ?0343 07/23/21 ?9833 07/24/21 ?0217  ?WBC 18.2* 14.0*  --  15.4*  --   --  16.8*  --  17.2*  ?NEUTROABS  --   --   --  13.7*  --   --  14.4*  --  13.9*  ?HGB 12.0* 11.8*   < > 10.9* 11.2* 13.6 11.3* 11.9* 11.7*  ?HCT 37.2* 38.6*   < > 33.1* 33.0* 40.0 35.1* 35.0* 36.1*  ?MCV 84.9 87.5  --  82.3  --   --  83.8  --  83.8  ?PLT 265 272  --  290  --   --  279  --  269  ? < > = values in this interval not displayed.  ? ? ?Basic Metabolic Panel: ?Recent Labs  ?Lab 07/18/21 ?0127 07/18/21 ?1528 07/19/21 ?0723 07/20/21 ?8250 07/20/21 ?5397 07/22/21 ?6734 07/22/21 ?0359 07/23/21 ?1937 07/23/21 ?0343 07/23/21 ?9024 07/24/21 ?0217  ?NA 136   < > 137 142   < > 146* 148* 147* 144 147* 147*  ?K 3.9   < > 4.7 4.9   < > 4.1 4.1 4.3 3.8 3.8 3.7  ?CL 101  --  99 104  --  107  --   --  105  --  105  ?CO2 21*  --  29 32  --  30  --   --  32  --  33*  ?GLUCOSE 176*  --  187* 205*  --  207*  --   --  236*  --  144*  ?BUN 25*  --  36* 43*  --  48*  --   --  39*  --  38*  ?CREATININE 0.92  --  1.11 1.17  --  1.08  --   --  1.04  --  0.99  ?CALCIUM 8.4*  --  7.9*  8.2*  --  8.5*  --   --  8.6*  --  8.6*  ?MG 2.4  --   --  3.3*  --   --   --   --  2.2  --   --   ? < > = values in this interval not displayed.  ? ? ?GFR: ?Estimated Creatinine Clearance: 76.6 mL/min (by C-G formula based on SCr of 0.99 mg/dL). ?Recent Labs  ?Lab 07/20/21 ?0973 07/22/21 ?5329 07/23/21 ?9242 07/24/21 ?0217  ?PROCALCITON  --   --  0.10 <  0.10  ?WBC 14.0* 15.4* 16.8* 17.2*  ? ? ? ?Liver Function Tests: ?Recent Labs  ?Lab 07/20/21 ?5997 07/24/21 ?0217  ?AST 41 39  ?ALT 90* 56*  ?ALKPHOS 123 85  ?BILITOT 0.3 0.5  ?PROT 6.3* 6.1*  ?ALBUMIN 2.3* 2.3*  ? ? ?No results for input(s): LIPASE, AMYLASE in the last 168 hours. ?No results for input(s): AMMONIA in the last 168 hours. ? ?ABG ?   ?Component Value Date/Time  ? PHART 7.441 07/23/2021 0513  ? PCO2ART 55.2 (H) 07/23/2021 0513  ? PO2ART 154 (H) 07/23/2021 0513  ? HCO3 37.4 (H) 07/23/2021 0513  ? TCO2 39 (H) 07/23/2021 0513  ? ACIDBASEDEF 1.7 07/19/2021 0735  ? O2SAT 99 07/23/2021 0513  ? ?  ?Coagulation Profile: ?No results for input(s): INR, PROTIME in the last 168 hours. ? ?Cardiac Enzymes: ?No results for input(s): CKTOTAL, CKMB, CKMBINDEX, TROPONINI in the last 168 hours. ? ?HbA1C: ?Hgb A1c MFr Bld  ?Date/Time Value Ref Range Status  ?07/16/2021 11:49 AM 6.6 (H) 4.8 - 5.6 % Final  ?  Comment:  ?  (NOTE) ?Pre diabetes:          5.7%-6.4% ? ?Diabetes:              >6.4% ? ?Glycemic control for   <7.0% ?adults with diabetes ?  ?12/28/2015 08:35 PM 5.8 (H) 4.8 - 5.6 % Final  ?  Comment:  ?  (NOTE) ?        Pre-diabetes: 5.7 - 6.4 ?        Diabetes: >6.4 ?        Glycemic control for adults with diabetes: <7.0 ?  ? ?CBG: ?Recent Labs  ?Lab 07/23/21 ?1534 07/23/21 ?1904 07/23/21 ?2313 07/24/21 ?0354 07/24/21 ?7414  ?GLUCAP 158* 177* 115* 201* 132*  ? ?CRITICAL CARE ?Performed by: Sanjuan Dame ? ?Total critical care time: 35 minutes ? ?Sanjuan Dame, MD ?Internal Medicine PGY-2 ?Pager: [phone] ? ? ?

## 2021-07-25 ENCOUNTER — Inpatient Hospital Stay (HOSPITAL_COMMUNITY): Payer: Medicare Other

## 2021-07-25 DIAGNOSIS — J189 Pneumonia, unspecified organism: Secondary | ICD-10-CM | POA: Diagnosis not present

## 2021-07-25 DIAGNOSIS — G9340 Encephalopathy, unspecified: Secondary | ICD-10-CM | POA: Diagnosis not present

## 2021-07-25 DIAGNOSIS — J80 Acute respiratory distress syndrome: Secondary | ICD-10-CM | POA: Diagnosis not present

## 2021-07-25 LAB — CBC WITH DIFFERENTIAL/PLATELET
Abs Immature Granulocytes: 0.19 10*3/uL — ABNORMAL HIGH (ref 0.00–0.07)
Basophils Absolute: 0 10*3/uL (ref 0.0–0.1)
Basophils Relative: 0 %
Eosinophils Absolute: 0.1 10*3/uL (ref 0.0–0.5)
Eosinophils Relative: 1 %
HCT: 36.3 % — ABNORMAL LOW (ref 39.0–52.0)
Hemoglobin: 11.4 g/dL — ABNORMAL LOW (ref 13.0–17.0)
Immature Granulocytes: 1 %
Lymphocytes Relative: 14 %
Lymphs Abs: 2.5 10*3/uL (ref 0.7–4.0)
MCH: 26.9 pg (ref 26.0–34.0)
MCHC: 31.4 g/dL (ref 30.0–36.0)
MCV: 85.6 fL (ref 80.0–100.0)
Monocytes Absolute: 1.1 10*3/uL — ABNORMAL HIGH (ref 0.1–1.0)
Monocytes Relative: 6 %
Neutro Abs: 13.5 10*3/uL — ABNORMAL HIGH (ref 1.7–7.7)
Neutrophils Relative %: 78 %
Platelets: 264 10*3/uL (ref 150–400)
RBC: 4.24 MIL/uL (ref 4.22–5.81)
RDW: 15.7 % — ABNORMAL HIGH (ref 11.5–15.5)
WBC: 17.3 10*3/uL — ABNORMAL HIGH (ref 4.0–10.5)
nRBC: 0 % (ref 0.0–0.2)

## 2021-07-25 LAB — BASIC METABOLIC PANEL
Anion gap: 9 (ref 5–15)
Anion gap: 9 (ref 5–15)
BUN: 39 mg/dL — ABNORMAL HIGH (ref 8–23)
BUN: 47 mg/dL — ABNORMAL HIGH (ref 8–23)
CO2: 34 mmol/L — ABNORMAL HIGH (ref 22–32)
CO2: 35 mmol/L — ABNORMAL HIGH (ref 22–32)
Calcium: 8.5 mg/dL — ABNORMAL LOW (ref 8.9–10.3)
Calcium: 8.9 mg/dL (ref 8.9–10.3)
Chloride: 98 mmol/L (ref 98–111)
Chloride: 104 mmol/L (ref 98–111)
Creatinine, Ser: 1.1 mg/dL (ref 0.61–1.24)
Creatinine, Ser: 1.17 mg/dL (ref 0.61–1.24)
GFR, Estimated: >60 mL/min (ref >60)
GFR, Estimated: >60 mL/min (ref >60)
Glucose, Bld: 184 mg/dL — ABNORMAL HIGH (ref 70–99)
Glucose, Bld: 176 mg/dL — ABNORMAL HIGH (ref 70–99)
Potassium: 3.7 mmol/L (ref 3.5–5.1)
Potassium: 4.3 mmol/L (ref 3.5–5.1)
Sodium: 141 mmol/L (ref 135–145)
Sodium: 148 mmol/L — ABNORMAL HIGH (ref 135–145)

## 2021-07-25 LAB — PROCALCITONIN: Procalcitonin: <0.1 ng/mL

## 2021-07-25 LAB — GLUCOSE, CAPILLARY
Glucose-Capillary: 120 mg/dL — ABNORMAL HIGH (ref 70–99)
Glucose-Capillary: 131 mg/dL — ABNORMAL HIGH (ref 70–99)
Glucose-Capillary: 150 mg/dL — ABNORMAL HIGH (ref 70–99)
Glucose-Capillary: 186 mg/dL — ABNORMAL HIGH (ref 70–99)
Glucose-Capillary: 218 mg/dL — ABNORMAL HIGH (ref 70–99)
Glucose-Capillary: 248 mg/dL — ABNORMAL HIGH (ref 70–99)
Glucose-Capillary: 82 mg/dL (ref 70–99)

## 2021-07-25 LAB — MAGNESIUM: Magnesium: 2.6 mg/dL — ABNORMAL HIGH (ref 1.7–2.4)

## 2021-07-25 MED ORDER — MIDAZOLAM HCL 2 MG/2ML IJ SOLN
2.0000 mg | Freq: Once | INTRAMUSCULAR | Status: AC
  Administered 2021-07-25: 2 mg via INTRAVENOUS

## 2021-07-25 MED ORDER — PREDNISONE 20 MG PO TABS
30.0000 mg | ORAL_TABLET | Freq: Every day | ORAL | Status: DC
  Administered 2021-07-25 – 2021-07-26 (×2): 30 mg
  Filled 2021-07-25 (×2): qty 1

## 2021-07-25 MED ORDER — DEXMEDETOMIDINE HCL IN NACL 400 MCG/100ML IV SOLN
0.4000 ug/kg/h | INTRAVENOUS | Status: DC
  Administered 2021-07-25 – 2021-07-26 (×4): 1.2 ug/kg/h via INTRAVENOUS
  Administered 2021-07-26: 0.6 ug/kg/h via INTRAVENOUS
  Administered 2021-07-26: 0.8 ug/kg/h via INTRAVENOUS
  Filled 2021-07-25 (×7): qty 100

## 2021-07-25 MED ORDER — LABETALOL HCL 5 MG/ML IV SOLN: 5.0000 mg | INTRAVENOUS | Status: DC | PRN

## 2021-07-25 MED ORDER — POTASSIUM CHLORIDE 20 MEQ PO PACK
40.0000 meq | PACK | Freq: Once | ORAL | Status: AC
  Administered 2021-07-25: 40 meq
  Filled 2021-07-25: qty 2

## 2021-07-25 MED ORDER — VITAL 1.5 CAL PO LIQD
1000.0000 mL | ORAL | Status: DC
  Administered 2021-07-26 – 2021-07-29 (×6): 1000 mL
  Filled 2021-07-25 (×4): qty 1000

## 2021-07-25 NOTE — TOC Progression Note (Addendum)
Transition of Care [facility]) - Progression Note    Patient Details  Name: Zachary Briggs MRN: [id number] Date of Birth: [birth year]  Transition [facility]) CM/SW [place], RN Phone Number:[phone]  07/25/2021, 12:55 PM  Clinical Narrative:    TOC continues to follow patient with high risk for readmission. Patient is not medically stable to identify needs or complete assessment. No family present at bed side. Remains on vent with agitation requiring sedation.        Expected Discharge Plan and Services                                                 Social Determinants of Health (SDOH) Interventions    Readmission Risk Interventions     View : No data to display.

## 2021-07-25 NOTE — Progress Notes (Signed)
Nutrition Follow-up  DOCUMENTATION CODES:   Not applicable  INTERVENTION:   Continue tube feeding via Cortrak tube: Increase Vital 1.5 goal rate to 55 ml/h (1320 ml per day)  Continue Prosource TF 45 ml TID  Provides 2100 kcal, 122 gm protein, 1008 ml free water daily.  NUTRITION DIAGNOSIS:   Inadequate oral intake related to inability to eat as evidenced by NPO status.  Ongoing  GOAL:   Patient will meet greater than or equal to 90% of their needs  Met with TF.  MONITOR:   Vent status, TF tolerance, Labs  REASON FOR ASSESSMENT:   Ventilator, Consult Enteral/tube feeding initiation and management  ASSESSMENT:   66 yo male admitted with SOB, CAP, ARDS. PMH includes COPD, smoker, HTN, aortic valve stenosis, hepatitis C, prostate cancer, BPH, PTSD, MDD.  Discussed patient in ICU rounds and with RN today. Patient has been tolerating weaning this morning. Sedation is being decreased. Following commands this morning; goal to extubate soon. Cortrak tube was repositioned yesterday as RN was having trouble flushing it. Tip is in the distal stomach.  Currently receiving TF via Cortrak: Vital 1.5 at 50 ml/h with Prosource Plus 45 ml TID. Tolerating well per RN. Free water flushes 200 ml every 4 hours.  Patient remains intubated on ventilator support. MV: 10 L/min Temp (24hrs), Avg:99.3 F (37.4 C), Min:98.9 F (37.2 C), Max:100 F (37.8 C)   Labs reviewed.  CBG: 186-150-131  Medications reviewed and include Colace, Lasix, Novolog, Levemir, Miralax, prednisone, Precedex, fentanyl.  Diet Order:   Diet Order             Diet NPO time specified  Diet effective now                   EDUCATION NEEDS:   Not appropriate for education at this time  Skin:  Skin Assessment: Reviewed RN Assessment  Last BM:  5/18 type 7  Height:   Ht Readings from Last 1 Encounters:  07/16/21 $RemoveB'5\' 6"'aJgoMpUL$  (1.676 m)    Weight:   Wt Readings from Last 1 Encounters:  07/25/21  82.4 kg     BMI:  Body mass index is 29.32 kg/m.  Estimated Nutritional Needs:   Kcal:  1950-2150  Protein:  110-130 gm  Fluid:  >/= 2 L    Lucas Mallow RD, LDN, CNSC Please refer to Amion for contact information.

## 2021-07-25 NOTE — Progress Notes (Signed)
ETT known to be 24 @ lips, air heard passing around ETT on assessment this evening. SpO2 upper 90s, pt receiving full volumes on ventilator. STAT CXR obtained, orders to advance ETT received. Advanced by RT Itedal with this RN and Mickel Baas PA at bedside. 159mg Fentanyl bolus x2 and '2mg'$  Versed push x2 given to allow for advancement. Repeat CXR obtained to confirm placement.

## 2021-07-25 NOTE — Progress Notes (Signed)
Advanced et tube 2cm to 26'@the'$  lip xray to confirm placement

## 2021-07-25 NOTE — Progress Notes (Signed)
NAME:  Zachary Briggs, MRN:  [id number], DOB:  September 06, 1955, LOS: 9 ADMISSION DATE:  07/16/2021, CONSULTATION DATE:  07/17/21 REFERRING MD:  Graciella Freer, CHIEF COMPLAINT:  Shortness of breath   History of Present Illness:  66 yo man with hx of COPD, shortness of breath and productive cough x 2 days.   Admitted 5/9 AM.  Occasional chest pain.    Cough with yellow sputum, orthopnea.  Uses a walker, not terribly active at baseline.   Current long term smoker.    Was initially on bipap and tolerating well.  Trial off bipap early am, to HFNC, did not tolerate well.   Placed back on bipap at about 445. Anxiety concurrent with mask.  Sat dropped to 70s. Bipap titrated up to 12 over 10, gradually sat came up to high 90s.   Initial abg just with hypoxemia, CO2 36.   Started looking slightly better with higher bipap settings.   Trial of lasix.   Transported to 6E at 1130pm from ED  Pertinent  Medical History  COPD HTN Aortic valve stenosis NSVT/PSVT with loop recorder Prostate carcinoma BPH  PTSD MDD  Coreg, asa, lipitor, hydrodiuil, nortriptyline, protonix, zoloft, flomax   Significant Hospital Events: Including procedures, antibiotic start and stop dates in addition to other pertinent events   5/12 intubated started on neuromuscular blockade and placed in prone position 5/14 now tolerating supine ventilation. 5/16 extremely agitated, requiring high doses of sedation 5/17 SBT, weaning down on sedation  Interim History / Subjective:  Overnight was able to get full night of sleep. This morning try to wean down on sedation.   Objective   Blood pressure 104/69, pulse 81, temperature 99.1 F (37.3 C), temperature source Oral, resp. rate (!) 22, height '5\' 6"'$  (1.676 m), weight 82.4 kg, SpO2 100 %.    Vent Mode: PSV;CPAP FiO2 (%):  [40 %] 40 % Set Rate:  [20 bmp] 20 bmp Vt Set:  [510 mL] 510 mL PEEP:  [5 cmH20] 5 cmH20 Pressure Support:  [10 cmH20] 10 cmH20 Plateau Pressure:  [18 cmH20-32  cmH20] 21 cmH20   Intake/Output Summary (Last 24 hours) at 07/25/2021 0859 Last data filed at 07/25/2021 0700 Gross per 24 hour  Intake 5463.23 ml  Output 3300 ml  Net 2163.23 ml    Filed Weights   07/23/21 0340 07/24/21 0410 07/25/21 0500  Weight: 88.2 kg 86.3 kg 82.4 kg   Examination: General: Resting in bed, no acute distress, intubated.  HENT: Normocephalic, atraumatic. ETT, OG tube in place.  Tube feeds running.  Lungs:  Intubated, sedated. Clear to ausculation, no wheezing. Cardiovascular: Regular rate, rhythm. No murmurs appreciated.  Extremities warm and well-perfused Abdomen: Soft, non-tender, still mildly distended. Normoactive bowel sounds. Having appropriate stool output.  Extremities: Warm, dry. No peripheral edema appreciated. Neuro: Sedated, non-focal. Will respond to name, follow minimal commands.   Assessment & Plan:  #Acute on chronic hypoxic respiratory failure 2/2 ARDS #Multi-focal community acquired pneumonia, culture negative #Tobacco use disorder, suspicion obstructive lung disease #Chronic obesity hypoventilation syndrome Stable from respiratory standpoint. FiO2 40%, PEEP 5. Weaning down on sedation this morning, SBT.  - SBT this AM - Lung protective ventilation - VAP bundle - Stress ulcer prophylaxis - Steroid taper, decrease prednisone '30mg'$  today - IV furosemide '60mg'$  three times daily - Bronchodilators  #Acute metabolic encephalopathy This morning, mental status improved, intermittently following commands. Will continue to wean down on sedation, SBT. - Continue Seroquel '200mg'$  twice daily, will repeat ECG today - Continue Klonopin  $'2mg'u$  twice daily - Continue oxycodone '10mg'$  every 6 hours  #Hypertension BP continues to be variable, likely due to sedation and agitation. Will hold off further antihypertensives, should be able to assess further once agitation improves.  - Clonidine 0.'1mg'$  twice daily - Amlodipine '10mg'$  daily - Carvedilol '25mg'$  twice  daily  #Type II diabetes mellitus. A1c 6.6% on admission. Likely hyperglycemic 2/2 stress, steroids. Continues to get tube feeds. Sugars overall at goal <180, will continue with current regimen.  - Lemevir 18 units twice daily - Novolog 8 units every 4 hours - SSI  #Acute normocytic anemia No previous hx of anemia or renal disease. Most likely due to acute critical illness. No signs or symptoms of bleeding. Hgb stable.   Best Practice (right click and "Reselect all SmartList Selections" daily)   Diet/type: tubefeeds DVT prophylaxis: LMWH GI prophylaxis: PPI Lines: N/A Foley:  N/A Code Status:  full code Last date of multidisciplinary goals of care discussion [ family updated at bedside ]  Labs   CBC: Recent Labs  Lab 07/20/21 0514 07/20/21 0926 07/22/21 0344 07/22/21 0359 07/23/21 0057 07/23/21 0343 07/23/21 0513 07/24/21 0217 07/25/21 0310  WBC 14.0*  --  15.4*  --   --  16.8*  --  17.2* 17.3*  NEUTROABS  --   --  13.7*  --   --  14.4*  --  13.9* 13.5*  HGB 11.8*   < > 10.9*   < > 13.6 11.3* 11.9* 11.7* 11.4*  HCT 38.6*   < > 33.1*   < > 40.0 35.1* 35.0* 36.1* 36.3*  MCV 87.5  --  82.3  --   --  83.8  --  83.8 85.6  PLT 272  --  290  --   --  279  --  269 264   < > = values in this interval not displayed.    Basic Metabolic Panel: Recent Labs  Lab 07/20/21 0514 07/20/21 0926 07/22/21 0344 07/22/21 0359 07/23/21 0343 07/23/21 0513 07/24/21 0217 07/24/21 1645 07/25/21 0310  NA 142   < > 146*   < > 144 147* 147* 145 141  K 4.9   < > 4.1   < > 3.8 3.8 3.7 4.8 3.7  CL 104  --  107  --  105  --  105 102 98  CO2 32  --  30  --  32  --  33* 33* 34*  GLUCOSE 205*  --  207*  --  236*  --  144* 236* 184*  BUN 43*  --  48*  --  39*  --  38* 42* 39*  CREATININE 1.17  --  1.08  --  1.04  --  0.99 1.08 1.10  CALCIUM 8.2*  --  8.5*  --  8.6*  --  8.6* 8.7* 8.5*  MG 3.3*  --   --   --  2.2  --   --   --  2.6*   < > = values in this interval not displayed.     GFR: Estimated Creatinine Clearance: 67.4 mL/min (by C-G formula based on SCr of 1.1 mg/dL). Recent Labs  Lab 07/22/21 0344 07/23/21 0343 07/24/21 0217 07/25/21 0310  PROCALCITON  --  0.10 <0.10 <0.10  WBC 15.4* 16.8* 17.2* 17.3*     Liver Function Tests: Recent Labs  Lab 07/20/21 0514 07/24/21 0217  AST 41 39  ALT 90* 56*  ALKPHOS 123 85  BILITOT 0.3 0.5  PROT 6.3* 6.1*  ALBUMIN 2.3* 2.3*    No results for input(s): LIPASE, AMYLASE in the last 168 hours. No results for input(s): AMMONIA in the last 168 hours.  ABG    Component Value Date/Time   PHART 7.441 07/23/2021 0513   PCO2ART 55.2 (H) 07/23/2021 0513   PO2ART 154 (H) 07/23/2021 0513   HCO3 37.4 (H) 07/23/2021 0513   TCO2 39 (H) 07/23/2021 0513   ACIDBASEDEF 1.7 07/19/2021 0735   O2SAT 99 07/23/2021 0513     Coagulation Profile: No results for input(s): INR, PROTIME in the last 168 hours.  Cardiac Enzymes: No results for input(s): CKTOTAL, CKMB, CKMBINDEX, TROPONINI in the last 168 hours.  HbA1C: Hgb A1c MFr Bld  Date/Time Value Ref Range Status  07/16/2021 11:49 AM 6.6 (H) 4.8 - 5.6 % Final    Comment:    (NOTE) Pre diabetes:          5.7%-6.4%  Diabetes:              >6.4%  Glycemic control for   <7.0% adults with diabetes   12/28/2015 08:35 PM 5.8 (H) 4.8 - 5.6 % Final    Comment:    (NOTE)         Pre-diabetes: 5.7 - 6.4         Diabetes: >6.4         Glycemic control for adults with diabetes: <7.0    CBG: Recent Labs  Lab 07/24/21 1501 07/24/21 1917 07/24/21 2341 07/25/21 0308 07/25/21 0723  GLUCAP 145* 173* 82 186* 150*   CRITICAL CARE Performed by: Sanjuan Dame  Total critical care time: 25 minutes  Sanjuan Dame, MD Internal Medicine PGY-2 Pager: [phone]

## 2021-07-26 DIAGNOSIS — J9601 Acute respiratory failure with hypoxia: Secondary | ICD-10-CM | POA: Diagnosis not present

## 2021-07-26 LAB — CBC WITH DIFFERENTIAL/PLATELET
Abs Immature Granulocytes: 0.15 10*3/uL — ABNORMAL HIGH (ref 0.00–0.07)
Basophils Absolute: 0 10*3/uL (ref 0.0–0.1)
Basophils Relative: 0 %
Eosinophils Absolute: 0.2 10*3/uL (ref 0.0–0.5)
Eosinophils Relative: 1 %
HCT: 37.7 % — ABNORMAL LOW (ref 39.0–52.0)
Hemoglobin: 11.6 g/dL — ABNORMAL LOW (ref 13.0–17.0)
Immature Granulocytes: 1 %
Lymphocytes Relative: 14 %
Lymphs Abs: 2.4 10*3/uL (ref 0.7–4.0)
MCH: 26.5 pg (ref 26.0–34.0)
MCHC: 30.8 g/dL (ref 30.0–36.0)
MCV: 86.1 fL (ref 80.0–100.0)
Monocytes Absolute: 1.2 10*3/uL — ABNORMAL HIGH (ref 0.1–1.0)
Monocytes Relative: 7 %
Neutro Abs: 13.2 10*3/uL — ABNORMAL HIGH (ref 1.7–7.7)
Neutrophils Relative %: 77 %
Platelets: 257 10*3/uL (ref 150–400)
RBC: 4.38 MIL/uL (ref 4.22–5.81)
RDW: 15.6 % — ABNORMAL HIGH (ref 11.5–15.5)
WBC: 17.1 10*3/uL — ABNORMAL HIGH (ref 4.0–10.5)
nRBC: 0 % (ref 0.0–0.2)

## 2021-07-26 LAB — BASIC METABOLIC PANEL
Anion gap: 8 (ref 5–15)
BUN: 49 mg/dL — ABNORMAL HIGH (ref 8–23)
CO2: 36 mmol/L — ABNORMAL HIGH (ref 22–32)
Calcium: 9 mg/dL (ref 8.9–10.3)
Chloride: 105 mmol/L (ref 98–111)
Creatinine, Ser: 1.13 mg/dL (ref 0.61–1.24)
GFR, Estimated: >60 mL/min (ref >60)
Glucose, Bld: 135 mg/dL — ABNORMAL HIGH (ref 70–99)
Potassium: 3.9 mmol/L (ref 3.5–5.1)
Sodium: 149 mmol/L — ABNORMAL HIGH (ref 135–145)

## 2021-07-26 LAB — GLUCOSE, CAPILLARY
Glucose-Capillary: 120 mg/dL — ABNORMAL HIGH (ref 70–99)
Glucose-Capillary: 152 mg/dL — ABNORMAL HIGH (ref 70–99)
Glucose-Capillary: 152 mg/dL — ABNORMAL HIGH (ref 70–99)
Glucose-Capillary: 247 mg/dL — ABNORMAL HIGH (ref 70–99)
Glucose-Capillary: 223 mg/dL — ABNORMAL HIGH (ref 70–99)

## 2021-07-26 MED ORDER — LIP MEDEX EX OINT: TOPICAL_OINTMENT | CUTANEOUS | Status: DC | PRN

## 2021-07-26 MED ORDER — PREGABALIN 50 MG PO CAPS
200.0000 mg | ORAL_CAPSULE | Freq: Two times a day (BID) | ORAL | Status: DC
Start: 2021-07-26 — End: 2021-07-31
  Administered 2021-07-26 – 2021-07-31 (×10): 200 mg
  Filled 2021-07-26 (×11): qty 1

## 2021-07-26 MED ORDER — FUROSEMIDE 10 MG/ML IJ SOLN
60.0000 mg | Freq: Once | INTRAMUSCULAR | Status: AC
  Administered 2021-07-26: 60 mg via INTRAVENOUS
  Filled 2021-07-26: qty 6

## 2021-07-26 MED ORDER — SODIUM CHLORIDE 3 % IN NEBU
4.0000 mL | INHALATION_SOLUTION | Freq: Three times a day (TID) | RESPIRATORY_TRACT | Status: AC
  Administered 2021-07-26 – 2021-07-28 (×9): 4 mL via RESPIRATORY_TRACT
  Filled 2021-07-26 (×9): qty 4

## 2021-07-26 MED ORDER — WHITE PETROLATUM EX OINT
TOPICAL_OINTMENT | CUTANEOUS | Status: DC | PRN
  Administered 2021-07-27: 1 via TOPICAL
  Filled 2021-07-26: qty 28.35

## 2021-07-26 MED ORDER — SERTRALINE HCL 25 MG PO TABS
150.0000 mg | ORAL_TABLET | Freq: Every day | ORAL | Status: DC
  Administered 2021-07-26 – 2021-07-31 (×6): 150 mg
  Filled 2021-07-26 (×2): qty 3
  Filled 2021-07-26 (×3): qty 2
  Filled 2021-07-26: qty 3

## 2021-07-26 NOTE — Progress Notes (Addendum)
NAME:  Zachary Briggs, MRN:  [id number], DOB:  03-Jun-1955, LOS: 33 ADMISSION DATE:  07/16/2021, CONSULTATION DATE:  07/17/21 REFERRING MD:  Graciella Freer, CHIEF COMPLAINT:  Shortness of breath   History of Present Illness:  66 yo man with hx of COPD, shortness of breath and productive cough x 2 days.   Admitted 5/9 AM.  Occasional chest pain.    Cough with yellow sputum, orthopnea.  Uses a walker, not terribly active at baseline.   Current long term smoker.    Was initially on bipap and tolerating well.  Trial off bipap early am, to HFNC, did not tolerate well.   Placed back on bipap at about 445. Anxiety concurrent with mask.  Sat dropped to 70s. Bipap titrated up to 12 over 10, gradually sat came up to high 90s.   Initial abg just with hypoxemia, CO2 36.   Started looking slightly better with higher bipap settings.   Trial of lasix.   Transported to 6E at 1130pm from ED  Pertinent  Medical History  COPD HTN Aortic valve stenosis NSVT/PSVT with loop recorder Prostate carcinoma BPH  PTSD MDD  Coreg, asa, lipitor, hydrodiuil, nortriptyline, protonix, zoloft, flomax   Significant Hospital Events: Including procedures, antibiotic start and stop dates in addition to other pertinent events   5/12 intubated started on neuromuscular blockade and placed in prone position 5/14 now tolerating supine ventilation. 5/16 extremely agitated, requiring high doses of sedation 5/17 SBT, weaning down on sedation  Interim History / Subjective:  This morning off sedation. Continues to be intermittently agitated, but improved overall.   Objective   Blood pressure (!) 141/103, pulse 85, temperature 99 F (37.2 C), temperature source Oral, resp. rate (!) 24, height '5\' 6"'$  (1.676 m), weight 81.1 kg, SpO2 100 %.    Vent Mode: PRVC FiO2 (%):  [40 %] 40 % Set Rate:  [20 bmp] 20 bmp Vt Set:  [510 mL] 510 mL PEEP:  [5 cmH20] 5 cmH20 Pressure Support:  [10 cmH20] 10 cmH20 Plateau Pressure:  [21 cmH20-38  cmH20] 33 cmH20   Intake/Output Summary (Last 24 hours) at 07/26/2021 0710 Last data filed at 07/26/2021 0600 Gross per 24 hour  Intake 2848.08 ml  Output 4750 ml  Net -1901.92 ml    Filed Weights   07/24/21 0410 07/25/21 0500 07/26/21 0500  Weight: 86.3 kg 82.4 kg 81.1 kg   Examination: General: Resting in bed, no acute distress, intubated.  HENT: Normocephalic, atraumatic. ETT, OG tube in place.  Tube feeds running.  Lungs:  Intubated, sedated. Clear to ausculation, no wheezing. Cardiovascular: Regular rate, rhythm. No murmurs appreciated.  Extremities warm and well-perfused Abdomen: Soft, non-tender, still mildly distended. Normoactive bowel sounds. Having appropriate stool output.  Extremities: Warm, dry. No peripheral edema appreciated. Neuro: Off sedation this morning, appears non-focal. Does follow commands.   Assessment & Plan:  #Acute on chronic hypoxic respiratory failure 2/2 ARDS #Multi-focal community acquired pneumonia, culture negative #Tobacco use disorder, suspicion obstructive lung disease #Chronic obesity hypoventilation syndrome Stable from respiratory standpoint. On pressure support this morning. Mental status appears to be improved. - SBT this AM - Lung protective ventilation - VAP bundle - Stress ulcer prophylaxis - Steroid taper, prednisone '30mg'$  today - Hold midday dose diuretics, re-assess urine output in PM - Bronchodilators  #Acute metabolic encephalopathy Mental status continues to improve over last 24h. Planning SBT today. - Continue Seroquel '200mg'$  twice daily, will repeat ECG today - Continue Klonopin '2mg'$  twice daily - Continue oxycodone '10mg'$  every  6 hours - Re-start home Zoloft, Lyrica  #Hypertension BP continues to be variable, likely due to sedation and agitation. Will hold off further antihypertensives, should be able to assess further once agitation improves.  - Clonidine 0.'1mg'$  twice daily - Amlodipine '10mg'$  daily - Carvedilol '25mg'$  twice  daily  #Type II diabetes mellitus. A1c 6.6% on admission. Likely hyperglycemic 2/2 stress, steroids. Continues to get tube feeds. Sugars overall at goal <180, will continue with current regimen.  - Lemevir 18 units twice daily - Novolog 8 units every 4 hours - SSI  #Hypernatremia Na 149 this morning, along with worsening alkalosis. Has 2.6L free water deficit this morning. Will hold further diuresis and continue with free water flushes. - Will decide on further diuretics this afternoon - Continue free water flushes 200cc every 4 hours  #Acute normocytic anemia No previous hx of anemia or renal disease. Most likely due to acute critical illness. No signs or symptoms of bleeding. Hgb stable.   Best Practice (right click and "Reselect all SmartList Selections" daily)   Diet/type: tubefeeds DVT prophylaxis: LMWH GI prophylaxis: PPI Lines: N/A Foley:  N/A Code Status:  full code Last date of multidisciplinary goals of care discussion [ family updated at bedside ]  Labs   CBC: Recent Labs  Lab 07/22/21 0344 07/22/21 0359 07/23/21 0343 07/23/21 0513 07/24/21 0217 07/25/21 0310 07/26/21 0526  WBC 15.4*  --  16.8*  --  17.2* 17.3* 17.1*  NEUTROABS 13.7*  --  14.4*  --  13.9* 13.5* 13.2*  HGB 10.9*   < > 11.3* 11.9* 11.7* 11.4* 11.6*  HCT 33.1*   < > 35.1* 35.0* 36.1* 36.3* 37.7*  MCV 82.3  --  83.8  --  83.8 85.6 86.1  PLT 290  --  279  --  269 264 257   < > = values in this interval not displayed.    Basic Metabolic Panel: Recent Labs  Lab 07/20/21 0514 07/20/21 0926 07/23/21 0343 07/23/21 0513 07/24/21 0217 07/24/21 1645 07/25/21 0310 07/25/21 2118 07/26/21 0526  NA 142   < > 144   < > 147* 145 141 148* 149*  K 4.9   < > 3.8   < > 3.7 4.8 3.7 4.3 3.9  CL 104   < > 105  --  105 102 98 104 105  CO2 32   < > 32  --  33* 33* 34* 35* 36*  GLUCOSE 205*   < > 236*  --  144* 236* 184* 176* 135*  BUN 43*   < > 39*  --  38* 42* 39* 47* 49*  CREATININE 1.17   < > 1.04   --  0.99 1.08 1.10 1.17 1.13  CALCIUM 8.2*   < > 8.6*  --  8.6* 8.7* 8.5* 8.9 9.0  MG 3.3*  --  2.2  --   --   --  2.6*  --   --    < > = values in this interval not displayed.    GFR: Estimated Creatinine Clearance: 65.2 mL/min (by C-G formula based on SCr of 1.13 mg/dL). Recent Labs  Lab 07/23/21 0343 07/24/21 0217 07/25/21 0310 07/26/21 0526  PROCALCITON 0.10 <0.10 <0.10  --   WBC 16.8* 17.2* 17.3* 17.1*     Liver Function Tests: Recent Labs  Lab 07/20/21 0514 07/24/21 0217  AST 41 39  ALT 90* 56*  ALKPHOS 123 85  BILITOT 0.3 0.5  PROT 6.3* 6.1*  ALBUMIN 2.3* 2.3*  No results for input(s): LIPASE, AMYLASE in the last 168 hours. No results for input(s): AMMONIA in the last 168 hours.  ABG    Component Value Date/Time   PHART 7.441 07/23/2021 0513   PCO2ART 55.2 (H) 07/23/2021 0513   PO2ART 154 (H) 07/23/2021 0513   HCO3 37.4 (H) 07/23/2021 0513   TCO2 39 (H) 07/23/2021 0513   ACIDBASEDEF 1.7 07/19/2021 0735   O2SAT 99 07/23/2021 0513     Coagulation Profile: No results for input(s): INR, PROTIME in the last 168 hours.  Cardiac Enzymes: No results for input(s): CKTOTAL, CKMB, CKMBINDEX, TROPONINI in the last 168 hours.  HbA1C: Hgb A1c MFr Bld  Date/Time Value Ref Range Status  07/16/2021 11:49 AM 6.6 (H) 4.8 - 5.6 % Final    Comment:    (NOTE) Pre diabetes:          5.7%-6.4%  Diabetes:              >6.4%  Glycemic control for   <7.0% adults with diabetes   12/28/2015 08:35 PM 5.8 (H) 4.8 - 5.6 % Final    Comment:    (NOTE)         Pre-diabetes: 5.7 - 6.4         Diabetes: >6.4         Glycemic control for adults with diabetes: <7.0    CBG: Recent Labs  Lab 07/25/21 1108 07/25/21 1517 07/25/21 1927 07/25/21 2330 07/26/21 0332  GLUCAP 131* 248* 218* 120* 120*   CRITICAL CARE Performed by: Sanjuan Dame  Total critical care time: 25 minutes  Sanjuan Dame, MD Internal Medicine PGY-2 Pager: [phone]

## 2021-07-27 DIAGNOSIS — J9601 Acute respiratory failure with hypoxia: Secondary | ICD-10-CM | POA: Diagnosis not present

## 2021-07-27 LAB — GLUCOSE, CAPILLARY
Glucose-Capillary: 132 mg/dL — ABNORMAL HIGH (ref 70–99)
Glucose-Capillary: 161 mg/dL — ABNORMAL HIGH (ref 70–99)
Glucose-Capillary: 146 mg/dL — ABNORMAL HIGH (ref 70–99)
Glucose-Capillary: 148 mg/dL — ABNORMAL HIGH (ref 70–99)
Glucose-Capillary: 190 mg/dL — ABNORMAL HIGH (ref 70–99)
Glucose-Capillary: 139 mg/dL — ABNORMAL HIGH (ref 70–99)
Glucose-Capillary: 107 mg/dL — ABNORMAL HIGH (ref 70–99)

## 2021-07-27 LAB — BASIC METABOLIC PANEL
Anion gap: 11 (ref 5–15)
BUN: 59 mg/dL — ABNORMAL HIGH (ref 8–23)
CO2: 35 mmol/L — ABNORMAL HIGH (ref 22–32)
Calcium: 9.1 mg/dL (ref 8.9–10.3)
Chloride: 103 mmol/L (ref 98–111)
Creatinine, Ser: 1.3 mg/dL — ABNORMAL HIGH (ref 0.61–1.24)
GFR, Estimated: >60 mL/min (ref >60)
Glucose, Bld: 157 mg/dL — ABNORMAL HIGH (ref 70–99)
Potassium: 3.6 mmol/L (ref 3.5–5.1)
Sodium: 149 mmol/L — ABNORMAL HIGH (ref 135–145)

## 2021-07-27 LAB — MAGNESIUM: Magnesium: 3 mg/dL — ABNORMAL HIGH (ref 1.7–2.4)

## 2021-07-27 MED ORDER — OXYCODONE HCL 5 MG PO TABS
5.0000 mg | ORAL_TABLET | Freq: Four times a day (QID) | ORAL | Status: DC
  Administered 2021-07-27 – 2021-07-28 (×3): 5 mg
  Filled 2021-07-27 (×3): qty 1

## 2021-07-27 MED ORDER — CLONAZEPAM 1 MG PO TABS
1.0000 mg | ORAL_TABLET | Freq: Two times a day (BID) | ORAL | Status: DC
  Administered 2021-07-27: 1 mg
  Filled 2021-07-27: qty 1

## 2021-07-27 MED ORDER — FREE WATER
200.0000 mL | Status: DC
  Administered 2021-07-27 – 2021-07-28 (×12): 200 mL

## 2021-07-27 MED ORDER — PREDNISONE 20 MG PO TABS
20.0000 mg | ORAL_TABLET | Freq: Every day | ORAL | Status: DC
  Administered 2021-07-27 – 2021-07-28 (×2): 20 mg
  Filled 2021-07-27 (×2): qty 1

## 2021-07-27 MED ORDER — RACEPINEPHRINE HCL 2.25 % IN NEBU
INHALATION_SOLUTION | RESPIRATORY_TRACT | Status: AC
Start: 2021-07-27 — End: 2021-07-27
  Administered 2021-07-27: 0.5 mL
  Filled 2021-07-27: qty 0.5

## 2021-07-27 MED ORDER — FREE WATER: 200.0000 mL | Status: DC

## 2021-07-27 MED ORDER — POTASSIUM CHLORIDE 10 MEQ/100ML IV SOLN
10.0000 meq | INTRAVENOUS | Status: AC
  Administered 2021-07-27 (×4): 10 meq via INTRAVENOUS
  Filled 2021-07-27 (×4): qty 100

## 2021-07-27 MED ORDER — POTASSIUM CHLORIDE 20 MEQ PO PACK
20.0000 meq | PACK | Freq: Once | ORAL | Status: AC
  Administered 2021-07-27: 20 meq
  Filled 2021-07-27: qty 1

## 2021-07-27 NOTE — Procedures (Signed)
Extubation Procedure Note  Patient Details:   Name: Zachary Briggs [facility] DOB: 11-23-1955 MRN: [id number]   Airway Documentation:  Airway 8 mm (Active)  Secured at (cm) 26 cm 07/27/21 0704  Measured From Lips 07/27/21 0704  Secured Location Left 07/27/21 0704  Secured By [employer] 07/27/21 0704  Tube Holder Repositioned Yes 07/27/21 0704  Prone position No 07/27/21 0704  Head position Right 07/21/21 0402  Cuff Pressure (cm H2O) Clear OR 27-39 CmH2O 07/27/21 0704  Site Condition Dry 07/27/21 0704     Evaluation  O2 sats: stable throughout Complications: No apparent complications Patient did tolerate procedure well. Bilateral Breath Sounds: Clear   No  Patient extubated per MD order. Prior to extubation patient was able to nod head, stick tongue out, positive cuff leak. Patient extubated to 4L [place] sat 95%, RR25. Patient not able to say name, patient is able to squeeze hand,  shake head and nod. Patient did have stridor, Racemic Nebulizer given at this time and resolved. Patient has very weak cough. CPT done at this time, RT will continue to monitor. Vent left in patients room.  Binnie Kand 07/27/2021, 11:15 AM

## 2021-07-27 NOTE — Progress Notes (Signed)
eLink Physician-Brief Progress Note Patient Name: Zachary Briggs [facility] DOB: 04/12/1955 MRN: [id number]   Date of Service  07/27/2021  HPI/Events of Note  Agitation - Nursing request to renew restraint order.  eICU Interventions  Will renew bilateral soft restraint orders X 7 hours.      Intervention Category Major Interventions: Delirium, psychosis, severe agitation - evaluation and management  Bayne Fosnaugh [place] 07/27/2021, 2:50 AM

## 2021-07-27 NOTE — Progress Notes (Signed)
NAME:  Zachary Briggs, MRN:  [id number], DOB:  [birth year], LOS: 79 ADMISSION DATE:  07/16/2021, CONSULTATION DATE:  07/17/21 REFERRING MD:  Graciella Freer, CHIEF COMPLAINT:  Shortness of breath   History of Present Illness:  66 yo man with hx of COPD, shortness of breath and productive cough x 2 days.   Admitted 5/9 AM.  Occasional chest pain.    Cough with yellow sputum, orthopnea.  Uses a walker, not terribly active at baseline.   Current long term smoker.    Was initially on bipap and tolerating well.  Trial off bipap early am, to HFNC, did not tolerate well.   Placed back on bipap at about 445. Anxiety concurrent with mask.  Sat dropped to 70s. Bipap titrated up to 12 over 10, gradually sat came up to high 90s.   Initial abg just with hypoxemia, CO2 36.   Started looking slightly better with higher bipap settings.   Trial of lasix.   Transported to 6E at 1130pm from ED  Pertinent  Medical History  COPD HTN Aortic valve stenosis NSVT/PSVT with loop recorder Prostate carcinoma BPH  PTSD MDD  Coreg, asa, lipitor, hydrodiuil, nortriptyline, protonix, zoloft, flomax   Significant Hospital Events: Including procedures, antibiotic start and stop dates in addition to other pertinent events   5/12 intubated started on neuromuscular blockade and placed in prone position 5/14 now tolerating supine ventilation. 5/16 extremely agitated, requiring high doses of sedation 5/17 SBT, weaning down on sedation  Interim History / Subjective:  This morning patient following commands, doing well on pressure support ventilation.  Objective   Blood pressure 90/72, pulse 65, temperature 97.6 F (36.4 C), temperature source Axillary, resp. rate 20, height '5\' 6"'$  (1.676 m), weight 82.6 kg, SpO2 99 %.    Vent Mode: PSV;CPAP FiO2 (%):  [40 %] 40 % Set Rate:  [20 bmp] 20 bmp Vt Set:  [510 mL] 510 mL PEEP:  [5 cmH20] 5 cmH20 Pressure Support:  [5 cmH20-10 cmH20] 10 cmH20 Plateau Pressure:  [17 cmH20-23  cmH20] 20 cmH20   Intake/Output Summary (Last 24 hours) at 07/27/2021 0757 Last data filed at 07/27/2021 0600 Gross per 24 hour  Intake 3152.72 ml  Output 2400 ml  Net 752.72 ml    Filed Weights   07/25/21 0500 07/26/21 0500 07/27/21 0500  Weight: 82.4 kg 81.1 kg 82.6 kg   Examination: General: Resting in bed, no acute distress, intubated.  HENT: Normocephalic, atraumatic. ETT, OG tube in place.  Tube feeds running.  Lungs:  Intubated. Clear to ausculation, no wheezing. Cardiovascular: Regular rate, rhythm. No murmurs appreciated.  Extremities warm and well-perfused Abdomen: Soft, non-tender, still mildly distended. Normoactive bowel sounds. Having appropriate stool output.  Extremities: Warm, dry. No peripheral edema appreciated. Neuro: Off sedation, appears non-focal. Following commands.   Assessment & Plan:  #Acute on chronic hypoxic respiratory failure 2/2 ARDS #Multi-focal community acquired pneumonia, culture negative #Tobacco use disorder, suspicion obstructive lung disease #Chronic obesity hypoventilation syndrome Doing well on 5/5 pressure support this morning. Encephalopathy has markedly improved over last few days. Likely plan for extubation later this morning.  - Extubation later this morning - Steroid taper, decrease prednisone '20mg'$  today - Hold further diuretics - Bronchodilators  #Acute metabolic encephalopathy Mental status improved, now is following commands. Still requiring some Precedex, will continue to wean as tolerated. - Precedex gtt, wean as tolerated  - Continue Seroquel '200mg'$  twice daily, will repeat ECG today - Continue Klonopin '2mg'$  twice daily - Continue oxycodone '10mg'$  every  6 hours - Home Zoloft, Lyrica  #Hypertension BP stable w/ MAP 70-80's. Will continue with current regimen.  - Clonidine 0.'1mg'$  twice daily - Amlodipine '10mg'$  daily - Carvedilol '25mg'$  twice daily  #Type II diabetes mellitus. A1c 6.6% on admission. Likely hyperglycemic 2/2  stress, steroids. Continues to get tube feeds. Sugars overall at goal <180, will continue with current regimen.  - Lemevir 18 units twice daily - Novolog 8 units every 4 hours - SSI  #Hypernatremia Na 149, stable from yesterday. Will increase free water flushes, hold diuretics.  - Hold diuretics - Increase free water flushes 200cc q2h  #Acute normocytic anemia No previous hx of anemia or renal disease. Most likely due to acute critical illness. No signs or symptoms of bleeding. Hgb stable.   Best Practice (right click and "Reselect all SmartList Selections" daily)   Diet/type: tubefeeds DVT prophylaxis: LMWH GI prophylaxis: PPI Lines: N/A Foley:  N/A Code Status:  full code Last date of multidisciplinary goals of care discussion [ family updated at bedside ]  Labs   CBC: Recent Labs  Lab 07/22/21 0344 07/22/21 0359 07/23/21 0343 07/23/21 0513 07/24/21 0217 07/25/21 0310 07/26/21 0526  WBC 15.4*  --  16.8*  --  17.2* 17.3* 17.1*  NEUTROABS 13.7*  --  14.4*  --  13.9* 13.5* 13.2*  HGB 10.9*   < > 11.3* 11.9* 11.7* 11.4* 11.6*  HCT 33.1*   < > 35.1* 35.0* 36.1* 36.3* 37.7*  MCV 82.3  --  83.8  --  83.8 85.6 86.1  PLT 290  --  279  --  269 264 257   < > = values in this interval not displayed.    Basic Metabolic Panel: Recent Labs  Lab 07/23/21 0343 07/23/21 0513 07/24/21 1645 07/25/21 0310 07/25/21 2118 07/26/21 0526 07/27/21 0442  NA 144   < > 145 141 148* 149* 149*  K 3.8   < > 4.8 3.7 4.3 3.9 3.6  CL 105   < > 102 98 104 105 103  CO2 32   < > 33* 34* 35* 36* 35*  GLUCOSE 236*   < > 236* 184* 176* 135* 157*  BUN 39*   < > 42* 39* 47* 49* 59*  CREATININE 1.04   < > 1.08 1.10 1.17 1.13 1.30*  CALCIUM 8.6*   < > 8.7* 8.5* 8.9 9.0 9.1  MG 2.2  --   --  2.6*  --   --   --    < > = values in this interval not displayed.    GFR: Estimated Creatinine Clearance: 57.1 mL/min (A) (by C-G formula based on SCr of 1.3 mg/dL (H)). Recent Labs  Lab 07/23/21 0343  07/24/21 0217 07/25/21 0310 07/26/21 0526  PROCALCITON 0.10 <0.10 <0.10  --   WBC 16.8* 17.2* 17.3* 17.1*     Liver Function Tests: Recent Labs  Lab 07/24/21 0217  AST 39  ALT 56*  ALKPHOS 85  BILITOT 0.5  PROT 6.1*  ALBUMIN 2.3*    No results for input(s): LIPASE, AMYLASE in the last 168 hours. No results for input(s): AMMONIA in the last 168 hours.  ABG    Component Value Date/Time   PHART 7.441 07/23/2021 0513   PCO2ART 55.2 (H) 07/23/2021 0513   PO2ART 154 (H) 07/23/2021 0513   HCO3 37.4 (H) 07/23/2021 0513   TCO2 39 (H) 07/23/2021 0513   ACIDBASEDEF 1.7 07/19/2021 0735   O2SAT 99 07/23/2021 0513     Coagulation Profile: No  results for input(s): INR, PROTIME in the last 168 hours.  Cardiac Enzymes: No results for input(s): CKTOTAL, CKMB, CKMBINDEX, TROPONINI in the last 168 hours.  HbA1C: Hgb A1c MFr Bld  Date/Time Value Ref Range Status  07/16/2021 11:49 AM 6.6 (H) 4.8 - 5.6 % Final    Comment:    (NOTE) Pre diabetes:          5.7%-6.4%  Diabetes:              >6.4%  Glycemic control for   <7.0% adults with diabetes   12/28/2015 08:35 PM 5.8 (H) 4.8 - 5.6 % Final    Comment:    (NOTE)         Pre-diabetes: 5.7 - 6.4         Diabetes: >6.4         Glycemic control for adults with diabetes: <7.0    CBG: Recent Labs  Lab 07/26/21 1514 07/26/21 1924 07/26/21 2320 07/27/21 0320 07/27/21 0706  GLUCAP 247* 223* 132* 161* 146*   CRITICAL CARE Performed by: Sanjuan Dame  Total critical care time: 25 minutes  Sanjuan Dame, MD Internal Medicine PGY-2 Pager: [phone]

## 2021-07-28 DIAGNOSIS — J9601 Acute respiratory failure with hypoxia: Secondary | ICD-10-CM | POA: Diagnosis not present

## 2021-07-28 LAB — GLUCOSE, CAPILLARY
Glucose-Capillary: 130 mg/dL — ABNORMAL HIGH (ref 70–99)
Glucose-Capillary: 143 mg/dL — ABNORMAL HIGH (ref 70–99)
Glucose-Capillary: 165 mg/dL — ABNORMAL HIGH (ref 70–99)
Glucose-Capillary: 175 mg/dL — ABNORMAL HIGH (ref 70–99)
Glucose-Capillary: 152 mg/dL — ABNORMAL HIGH (ref 70–99)

## 2021-07-28 LAB — BASIC METABOLIC PANEL
Anion gap: 8 (ref 5–15)
BUN: 39 mg/dL — ABNORMAL HIGH (ref 8–23)
CO2: 29 mmol/L (ref 22–32)
Calcium: 8.6 mg/dL — ABNORMAL LOW (ref 8.9–10.3)
Chloride: 104 mmol/L (ref 98–111)
Creatinine, Ser: 1.01 mg/dL (ref 0.61–1.24)
GFR, Estimated: >60 mL/min (ref >60)
Glucose, Bld: 169 mg/dL — ABNORMAL HIGH (ref 70–99)
Potassium: 3.8 mmol/L (ref 3.5–5.1)
Sodium: 141 mmol/L (ref 135–145)

## 2021-07-28 LAB — MAGNESIUM: Magnesium: 2.4 mg/dL (ref 1.7–2.4)

## 2021-07-28 MED ORDER — POTASSIUM CHLORIDE 20 MEQ PO PACK
20.0000 meq | PACK | Freq: Once | ORAL | Status: DC
  Filled 2021-07-28: qty 1

## 2021-07-28 MED ORDER — QUETIAPINE FUMARATE 100 MG PO TABS
100.0000 mg | ORAL_TABLET | Freq: Two times a day (BID) | ORAL | Status: DC
  Administered 2021-07-28 (×2): 100 mg
  Filled 2021-07-28 (×2): qty 1

## 2021-07-28 MED ORDER — POTASSIUM CHLORIDE 20 MEQ PO PACK
20.0000 meq | PACK | Freq: Once | ORAL | Status: AC
  Administered 2021-07-28: 20 meq

## 2021-07-28 MED ORDER — PREDNISONE 10 MG PO TABS
10.0000 mg | ORAL_TABLET | Freq: Every day | ORAL | Status: AC
Start: 2021-07-29 — End: 2021-07-30
  Administered 2021-07-29 – 2021-07-30 (×2): 10 mg via ORAL
  Filled 2021-07-28 (×2): qty 1

## 2021-07-28 MED ORDER — CHLORHEXIDINE GLUCONATE 0.12 % MT SOLN
15.0000 mL | Freq: Two times a day (BID) | OROMUCOSAL | Status: DC
  Administered 2021-07-28 – 2021-07-31 (×6): 15 mL via OROMUCOSAL
  Filled 2021-07-28 (×6): qty 15

## 2021-07-28 MED ORDER — ORAL CARE MOUTH RINSE
15.0000 mL | Freq: Two times a day (BID) | OROMUCOSAL | Status: DC
  Administered 2021-07-29 – 2021-07-30 (×4): 15 mL via OROMUCOSAL

## 2021-07-28 MED ORDER — CLONAZEPAM 0.5 MG PO TABS
0.5000 mg | ORAL_TABLET | Freq: Two times a day (BID) | ORAL | Status: DC
  Administered 2021-07-28 (×2): 0.5 mg
  Filled 2021-07-28 (×2): qty 1

## 2021-07-28 NOTE — Progress Notes (Addendum)
NAME:  Zachary Briggs, MRN:  [id number], DOB:  [birth year], LOS: 33 ADMISSION DATE:  07/16/2021, CONSULTATION DATE:  07/17/21 REFERRING MD:  Graciella Freer, CHIEF COMPLAINT:  Shortness of breath   History of Present Illness:  66 yo man with hx of COPD, shortness of breath and productive cough x 2 days.   Admitted 5/9 AM.  Occasional chest pain.    Cough with yellow sputum, orthopnea.  Uses a walker, not terribly active at baseline.   Current long term smoker.    Was initially on bipap and tolerating well.  Trial off bipap early am, to HFNC, did not tolerate well.   Placed back on bipap at about 445. Anxiety concurrent with mask.  Sat dropped to 70s. Bipap titrated up to 12 over 10, gradually sat came up to high 90s.   Initial abg just with hypoxemia, CO2 36.   Started looking slightly better with higher bipap settings.   Trial of lasix.   Transported to 6E at 1130pm from ED  Pertinent  Medical History  COPD HTN Aortic valve stenosis NSVT/PSVT with loop recorder Prostate carcinoma BPH  PTSD MDD  Coreg, asa, lipitor, hydrodiuil, nortriptyline, protonix, zoloft, flomax   Significant Hospital Events: Including procedures, antibiotic start and stop dates in addition to other pertinent events   5/12 intubated started on neuromuscular blockade and placed in prone position 5/14 now tolerating supine ventilation. 5/16 extremely agitated, requiring high doses of sedation 5/17 SBT, weaning down on sedation 5/20 extubated  Interim History / Subjective:  Successful extubation yesterday. No acute events overnight.   Objective   Blood pressure 125/76, pulse 91, temperature 99 F (37.2 C), temperature source Oral, resp. rate (!) 29, height '5\' 6"'$  (1.676 m), weight 82.6 kg, SpO2 92 %.    Vent Mode: PSV;CPAP FiO2 (%):  [40 %] 40 % PEEP:  [5 cmH20] 5 cmH20 Pressure Support:  [10 cmH20] 10 cmH20   Intake/Output Summary (Last 24 hours) at 07/28/2021 0609 Last data filed at 07/28/2021 0400 Gross  per 24 hour  Intake 3695.84 ml  Output 1750 ml  Net 1945.84 ml    Filed Weights   07/26/21 0500 07/27/21 0500 07/28/21 0500  Weight: 81.1 kg 82.6 kg 82.6 kg   Examination: General: Resting in bed, no acute distress. HENT: Normocephalic, atraumatic.  Lungs:  Normal Clear to ausculation, no wheezing. Cardiovascular: Regular rate, rhythm. No murmurs appreciated.  Extremities warm and well-perfused Abdomen: Soft, non-tender, still mildly distended. Normoactive bowel sounds. Having appropriate stool output.  Extremities: Warm, dry. No peripheral edema appreciated. Neuro: Awake, alert, conversing appropriately.   Assessment & Plan:  #Acute on chronic hypoxic respiratory failure 2/2 ARDS, resolved #Multi-focal community acquired pneumonia, culture negative, resolved #Tobacco use disorder, suspicion obstructive lung disease #Chronic obesity hypoventilation syndrome Extubated yesterday, doing well this morning. Having some sore throat from being intubated but otherwise stable from respiratory standpoint.  - Steroid taper, continue prednisone '20mg'$  today - Hold further diuretics - Bronchodilators  #Acute metabolic encephalopathy Has been off Precedex since yesterday morning, appears to be mentally close to baseline. Yesterday decreased oxycodone and Klonopin. Will decrease seroquel today, repeat ECG for QT eval. - Decrease Seroquel 100 twice daily, r/p ECG today - Decrease Klonopin '1mg'$  twice daily - Hold oxycodone  - Home Zoloft, Lyrica  #Hypertension BP stable, not requiring pressors. Expect stabilization of BP now that he is off of sedation and is no longer agitated.  - Clonidine 0.'1mg'$  twice daily - Amlodipine '10mg'$  daily - Carvedilol '25mg'$  twice  daily  #Type II diabetes mellitus. A1c 6.6% on admission. Likely hyperglycemic 2/2 stress, steroids. Continues to get tube feeds. Sugars overall at goal <180, will continue with current regimen.  - Lemevir 18 units twice daily - Novolog 8  units every 4 hours - SSI  #Hypernatremia, resolved Na 141 this AM, down from 149. Will hold further free water flushes.   #Hx constipation Having appropriate stool output on tube feeds, will d/c bowel regimen.   Best Practice (right click and "Reselect all SmartList Selections" daily)   Diet/type: tubefeeds DVT prophylaxis: LMWH GI prophylaxis: PPI (home med) Lines: N/A Foley:  N/A Code Status:  full code Last date of multidisciplinary goals of care discussion [ family updated at bedside ]  Labs   CBC: Recent Labs  Lab 07/22/21 0344 07/22/21 0359 07/23/21 0343 07/23/21 0513 07/24/21 0217 07/25/21 0310 07/26/21 0526  WBC 15.4*  --  16.8*  --  17.2* 17.3* 17.1*  NEUTROABS 13.7*  --  14.4*  --  13.9* 13.5* 13.2*  HGB 10.9*   < > 11.3* 11.9* 11.7* 11.4* 11.6*  HCT 33.1*   < > 35.1* 35.0* 36.1* 36.3* 37.7*  MCV 82.3  --  83.8  --  83.8 85.6 86.1  PLT 290  --  279  --  269 264 257   < > = values in this interval not displayed.    Basic Metabolic Panel: Recent Labs  Lab 07/23/21 0343 07/23/21 0513 07/25/21 0310 07/25/21 2118 07/26/21 0526 07/27/21 0442 07/28/21 0409  NA 144   < > 141 148* 149* 149* 141  K 3.8   < > 3.7 4.3 3.9 3.6 3.8  CL 105   < > 98 104 105 103 104  CO2 32   < > 34* 35* 36* 35* 29  GLUCOSE 236*   < > 184* 176* 135* 157* 169*  BUN 39*   < > 39* 47* 49* 59* 39*  CREATININE 1.04   < > 1.10 1.17 1.13 1.30* 1.01  CALCIUM 8.6*   < > 8.5* 8.9 9.0 9.1 8.6*  MG 2.2  --  2.6*  --   --  3.0* 2.4   < > = values in this interval not displayed.    GFR: Estimated Creatinine Clearance: 73.5 mL/min (by C-G formula based on SCr of 1.01 mg/dL). Recent Labs  Lab 07/23/21 0343 07/24/21 0217 07/25/21 0310 07/26/21 0526  PROCALCITON 0.10 <0.10 <0.10  --   WBC 16.8* 17.2* 17.3* 17.1*     Liver Function Tests: Recent Labs  Lab 07/24/21 0217  AST 39  ALT 56*  ALKPHOS 85  BILITOT 0.5  PROT 6.1*  ALBUMIN 2.3*    No results for input(s): LIPASE,  AMYLASE in the last 168 hours. No results for input(s): AMMONIA in the last 168 hours.  ABG    Component Value Date/Time   PHART 7.441 07/23/2021 0513   PCO2ART 55.2 (H) 07/23/2021 0513   PO2ART 154 (H) 07/23/2021 0513   HCO3 37.4 (H) 07/23/2021 0513   TCO2 39 (H) 07/23/2021 0513   ACIDBASEDEF 1.7 07/19/2021 0735   O2SAT 99 07/23/2021 0513     Coagulation Profile: No results for input(s): INR, PROTIME in the last 168 hours.  Cardiac Enzymes: No results for input(s): CKTOTAL, CKMB, CKMBINDEX, TROPONINI in the last 168 hours.  HbA1C: Hgb A1c MFr Bld  Date/Time Value Ref Range Status  07/16/2021 11:49 AM 6.6 (H) 4.8 - 5.6 % Final    Comment:    (NOTE)  Pre diabetes:          5.7%-6.4%  Diabetes:              >6.4%  Glycemic control for   <7.0% adults with diabetes   12/28/2015 08:35 PM 5.8 (H) 4.8 - 5.6 % Final    Comment:    (NOTE)         Pre-diabetes: 5.7 - 6.4         Diabetes: >6.4         Glycemic control for adults with diabetes: <7.0    CBG: Recent Labs  Lab 07/27/21 1104 07/27/21 1507 07/27/21 1903 07/27/21 2310 07/28/21 0347  GLUCAP 148* 190* 139* 107* 130*   CRITICAL CARE Performed by: Sanjuan Dame  Total critical care time: 25 minutes  Sanjuan Dame, MD Internal Medicine PGY-2 Pager: [phone]

## 2021-07-29 DIAGNOSIS — R531 Weakness: Secondary | ICD-10-CM

## 2021-07-29 LAB — GLUCOSE, CAPILLARY
Glucose-Capillary: 101 mg/dL — ABNORMAL HIGH (ref 70–99)
Glucose-Capillary: 130 mg/dL — ABNORMAL HIGH (ref 70–99)
Glucose-Capillary: 138 mg/dL — ABNORMAL HIGH (ref 70–99)
Glucose-Capillary: 144 mg/dL — ABNORMAL HIGH (ref 70–99)
Glucose-Capillary: 160 mg/dL — ABNORMAL HIGH (ref 70–99)
Glucose-Capillary: 193 mg/dL — ABNORMAL HIGH (ref 70–99)

## 2021-07-29 LAB — BASIC METABOLIC PANEL
Anion gap: 8 (ref 5–15)
BUN: 29 mg/dL — ABNORMAL HIGH (ref 8–23)
CO2: 26 mmol/L (ref 22–32)
Calcium: 9.1 mg/dL (ref 8.9–10.3)
Chloride: 109 mmol/L (ref 98–111)
Creatinine, Ser: 0.99 mg/dL (ref 0.61–1.24)
GFR, Estimated: >60 mL/min (ref >60)
Glucose, Bld: 177 mg/dL — ABNORMAL HIGH (ref 70–99)
Potassium: 4.2 mmol/L (ref 3.5–5.1)
Sodium: 143 mmol/L (ref 135–145)

## 2021-07-29 NOTE — Progress Notes (Addendum)
   Subjective:  Patient extubated on 5/20 and transferred out of ICU on 5/21. IMTS taking back over as primary team this morning.   Patient is alert this morning and in hospital bed watching tv. He has no acute concerns. Encourage to use spirometer and flutter valve which are bedside. Patient wanted to sit on edge of bed for breakfast, but too weak at this time to do so safely.   Objective:  Vital signs in last 24 hours: Vitals:   07/29/21 0055 07/29/21 0448 07/29/21 0735 07/29/21 0834  BP: 122/71 122/79 112/78   Pulse: 77 75 72   Resp: (!) 21 (!) 24 20   Temp: 98.4 F (36.9 C) 98.6 F (37 C) 98.6 F (37 C)   TempSrc: Oral Oral    SpO2: 96% 97% 97% 100%  Weight:  83.1 kg    Height:        General: appears weak, NAD, raspy cough HEENT: Cortrak in place Cardiovascular: Normal rate, regular rhythm.  No murmurs Pulmonary : Effort normal, rales at bases Abdominal: soft, nontender,  bowel sounds present Musculoskeletal: no swelling, no deformity  Skin: Warm, dry Psychiatric/Behavioral:  normal affect, normal behavior   Assessment/Plan:  Active Problems:   Hypertension   ARDS (adult respiratory distress syndrome) (HCC)   Type 2 diabetes mellitus with hyperglycemia ([place])   Community acquired pneumonia   Weakness acquired in ICU   Hospital day #13 for [place]  a 66 y.o. male living with tobacco, moderate aortic valve stenosis NSVT/PSVT with implantable loop recorder who presented 5/9 to ED with acute hypoxic respiratory failure secondary to pneumonia and required transfer to ICU. Patient had worsening respiratory failure secondary to severe ARDS requiring intubation on 5/12. Patient extubated on 5/20 and transferred to the progressive unit on 5/21. Acute metabolic encephalopathy resolved.  #Acute hypoxic respiratory failure 2/2 culture negative community acquire pneumonia  - respiratory panel by PCR negative, strep urine antigen negative , legionella negative . S/p  antibiotic 7 days of Ceftriaxone and Linezolid. On 4 L supplemental oxygen via [place] this am. Continues to have thick secretions. Encourage to use flutter valve and spirometer. - Wean supplemental oxygen to maintain O2 Sat > 92% - Continue prednisone taper, stop date 5/23  #Weakness acquired in ICU - Patient critically ill on this admission and now in recovery phase. Encephalopathy improved. Encouraging use of spirometer and flutter valve. He very weak , but regaining appetite. Will continue to monitor intake today before removing Cortrak as patient will need increase protein intake for recovery. Will continue  - PT/OT eval and treat - SLP  #T2DM - hemoglobin A1c 6.6 on admission, Prior history of prediabetes. - Continue Levimir 18 units - Continue Novolog 8 units and SSI for tube feed coverage  #HTN - home medications are amlodipine 5 mg daily, carvedilol 6.25 mg BID, and Valsartan 240 mg daily. Normotensive on amlodipine 10 mgm Carvedilol 25 mg BID, and Clonidine .1 BID. - Stop Clonidine today. Kidney function improved, can restart home Valsartan if patient becomes hypertensive  #MDD - Continue home Zoloft - Stop Clonazepam and Quetiapine  DVT prophx: Lovenox Diet: Clears, Tube feeds by Cortrak Code: Full  Dispo: Anticipated discharge 2-3 days, patient requiring 4 L supplemental oxygen at rest for AHRF and pending OT/PT/SLP eval and recommendations .   Tamsen Snider, MD Internal Medicine PGY-3 Pager: [phone] After 5pm on weekdays and 1pm on weekends: On Call pager [phone]

## 2021-07-29 NOTE — Progress Notes (Signed)
Assisted back to bed  from chair with hoyer lift. Made comfortable on bed with HOB at 30 degree angle at all times. Wife at bedside and noted they argue at times for pt is trying to get out of bed and insisted to go home. Advised pt that there is no order for him to go home. Continue to monitor.

## 2021-07-29 NOTE — Evaluation (Signed)
Clinical/Bedside Swallow Evaluation Patient Details  Name: Zachary Briggs MRN: [id number] Date of Birth: 02-Jul-1955  Today's Date: 07/29/2021 Time: SLP Start Time (ACUTE ONLY): 0945 SLP Stop Time (ACUTE ONLY): 0955 SLP Time Calculation (min) (ACUTE ONLY): 10 min  Past Medical History:  Past Medical History:  Diagnosis Date   Aortic atherosclerosis ([place]) 12/29/2015   Aortic stenosis 12/30/2015   AVA around 1.3-1.4 cm2, trivial MR, normal LA   size, normal IVC.   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 man admitted with cough DOE with gradual worsening respiratory symptoms subsequently intubated on 5/12 with concern for severe ARDS status post proning and neuromuscular blockade.  Gradual improvement in oxygenation over time.intubated on 5/12, extubated 5/20.    Assessment / Plan / Recommendation  Clinical Impression  Pt demonstrates adequate aiblity to masticate and swallow solid and liquids without difficulty. He does need assisted feeding given UE weakness. SLP will advance diet to regular and sign off. SLP Visit Diagnosis: Dysphagia, unspecified (R13.10)    Aspiration Risk  Mild aspiration risk    Diet Recommendation Regular;Thin liquid   Liquid Administration via: Cup;Straw Medication Administration: Whole meds with liquid Supervision: Staff to assist with self feeding;Full supervision/cueing for compensatory strategies Compensations: Slow rate;Small sips/bites Postural Changes: Seated upright at 90 degrees    Other  Recommendations      Recommendations for follow up therapy are one component of a multi-disciplinary discharge planning process, led by the attending physician.  Recommendations may be updated based on  patient status, additional functional criteria and insurance authorization.  Follow up Recommendations        Assistance Recommended at Discharge    Functional Status Assessment    Frequency and Duration            Prognosis        Swallow Study   General HPI: 66 y.o. man admitted with cough DOE with gradual worsening respiratory symptoms subsequently intubated on 5/12 with concern for severe ARDS status post proning and neuromuscular blockade.  Gradual improvement in oxygenation over time.intubated on 5/12, extubated 5/20. Type of Study: Bedside Swallow Evaluation Previous Swallow Assessment: none Diet Prior to this Study: Thin liquids Temperature Spikes Noted: No Respiratory Status: Room air History of Recent Intubation: No Behavior/Cognition: Alert Oral Cavity Assessment: Within Functional Limits Oral Care Completed by SLP: No Oral Cavity - Dentition: Adequate natural dentition Vision: Functional for self-feeding Self-Feeding Abilities: Needs assist Patient Positioning: Upright in chair Baseline Vocal Quality: Normal Volitional Cough: Strong Volitional Swallow: Able to elicit    Oral/Motor/Sensory Function Overall Oral Motor/Sensory Function: Generalized oral weakness   Ice Chips     Thin Liquid Thin Liquid: Within functional limits Presentation: Straw    Nectar Thick Nectar Thick Liquid: Not tested   Honey Thick Honey Thick Liquid: Not tested   Puree Puree: Within functional limits   Solid     Solid: Within functional limits      Sheilah Rayos, Katherene Ponto 07/29/2021,12:21 PM

## 2021-07-29 NOTE — Plan of Care (Signed)
  Problem: Activity: Goal: Ability to tolerate increased activity will improve Outcome: Not Progressing   Problem: Education: Goal: Knowledge of General Education information will improve Description: Including pain rating scale, medication(s)/side effects and non-pharmacologic comfort measures Outcome: Not Progressing   Problem: Health Behavior/Discharge Planning: Goal: Ability to manage health-related needs will improve Outcome: Not Progressing   Problem: Clinical Measurements: Goal: Ability to maintain a body temperature in the normal range will improve Outcome: Progressing   Problem: Respiratory: Goal: Ability to maintain adequate ventilation will improve Outcome: Progressing Goal: Ability to maintain a clear airway will improve Outcome: Progressing   Problem: Clinical Measurements: Goal: Ability to maintain clinical measurements within normal limits will improve Outcome: Progressing Goal: Will remain free from infection Outcome: Progressing Goal: Diagnostic test results will improve Outcome: Progressing Goal: Respiratory complications will improve Outcome: Progressing Goal: Cardiovascular complication will be avoided Outcome: Progressing   Problem: Activity: Goal: Risk for activity intolerance will decrease Outcome: Progressing   Problem: Nutrition: Goal: Adequate nutrition will be maintained Outcome: Progressing   Problem: Coping: Goal: Level of anxiety will decrease Outcome: Progressing   Problem: Elimination: Goal: Will not experience complications related to bowel motility Outcome: Progressing Goal: Will not experience complications related to urinary retention Outcome: Progressing   Problem: Pain Managment: Goal: General experience of comfort will improve Outcome: Progressing   Problem: Safety: Goal: Ability to remain free from injury will improve Outcome: Progressing   Problem: Skin Integrity: Goal: Risk for impaired skin integrity will  decrease Outcome: Progressing   Problem: Safety: Goal: Non-violent Restraint(s) Outcome: Progressing

## 2021-07-29 NOTE — Progress Notes (Signed)
Nutrition Follow-up  DOCUMENTATION CODES:   Not applicable  INTERVENTION:  ***   NUTRITION DIAGNOSIS:   Inadequate oral intake related to inability to eat as evidenced by NPO status.  ***  GOAL:   Patient will meet greater than or equal to 90% of their needs  ***  MONITOR:   Vent status, TF tolerance, Labs  REASON FOR ASSESSMENT:   Ventilator, Consult Enteral/tube feeding initiation and management  ASSESSMENT:   66 yo male admitted with SOB, CAP, ARDS. PMH includes COPD, smoker, HTN, aortic valve stenosis, hepatitis C, prostate cancer, BPH, PTSD, MDD.  05/12 - Cortrak placed (tip in distal stomach)  Meal Completion: ***%  Medications reviewed and include: ***  Labs reviewed: ***  NUTRITION - FOCUSED PHYSICAL EXAM:  {RD Focused Exam List:21252}  Diet Order:   Diet Order             Diet regular Room service appropriate? Yes; Fluid consistency: Thin  Diet effective now                   EDUCATION NEEDS:   Not appropriate for education at this time  Skin:  Skin Assessment: Reviewed RN Assessment  Last BM:  5/18 type 7  Height:   Ht Readings from Last 1 Encounters:  07/16/21 '5\' 6"'$  (1.676 m)    Weight:   Wt Readings from Last 1 Encounters:  07/29/21 83.1 kg    Ideal Body Weight:     BMI:  Body mass index is 29.57 kg/m.  Estimated Nutritional Needs:   Kcal:  1950-2150  Protein:  110-130 gm  Fluid:  >/= 2 L    Gustavus Bryant, MS, RD, LDN Inpatient Clinical Dietitian Please see AMi[facility] for contact information.

## 2021-07-29 NOTE — Care Management Important Message (Signed)
Important Message  Patient Details  Name: Zachary Briggs MRN: [id number] Date of Birth: Feb 18, 1956   Medicare Important Message Given:  Yes     Orbie Pyo 07/29/2021, 3:27 PM

## 2021-07-29 NOTE — Evaluation (Signed)
Occupational Therapy Evaluation Patient Details Name: Zachary Briggs MRN: [id number] DOB: 1955-04-25 Today's Date: 07/29/2021   History of Present Illness Patient is a 66 y/o male who presents on 5/9 with cough and SOB, with concern for ARDS, intubated 5/12-5/20. Admitted with acute respiratory failure secondary to PNA, toxic metabolic encephalopathy in the setting of critical illness, delirium and anxiety. PMH includes Hep C, HTN, prostate ca, tobacco abuse.   Clinical Impression   PTA patient reports independent with ADLs, limited IADls and using rollator for mobility. Admitted for above and presents with problem list below, including impaired cognition, weakness (L UE weaker than R UE), decreased activity tolerance, impaired balance.  He requires +2 max assist for squat pivot to recliner, min to total assist +2 for ADLS.  Patient oriented to self and place, but not time or situation, he is able to follow simple commands with increased time but demonstrates poor safety, awareness, problem solving.  Believe he will best benefit from continued OT services acutely and after dc at AIR level to optimize independence, safety and return to PLOF.      Recommendations for follow up therapy are one component of a multi-disciplinary discharge planning process, led by the attending physician.  Recommendations may be updated based on patient status, additional functional criteria and insurance authorization.   Follow Up Recommendations  Acute inpatient rehab (3hours/day)    Assistance Recommended at Discharge Frequent or constant Supervision/Assistance  Patient can return home with the following Two people to help with walking and/or transfers;Two people to help with bathing/dressing/bathroom;Assistance with feeding;Assistance with cooking/housework;Direct supervision/assist for medications management;Direct supervision/assist for financial management;Assist for transportation;Help with stairs or ramp for  entrance    Functional Status Assessment  Patient has had a recent decline in their functional status and demonstrates the ability to make significant improvements in function in a reasonable and predictable amount of time.  Equipment Recommendations  Other (comment) (defer)    Recommendations for Other Services Rehab consult     Precautions / Restrictions Precautions Precautions: Fall;Other (comment) Precaution Comments: watch 02, NG tube, rectal pouch Restrictions Weight Bearing Restrictions: No      Mobility Bed Mobility Overal bed mobility: Needs Assistance Bed Mobility: Rolling, Sidelying to Sit Rolling: Max assist, +2 for physical assistance Sidelying to sit: Max assist, +2 for physical assistance, HOB elevated       General bed mobility comments: Step by step cues for sequencing, assist with LEs, to reach for rail and for trunk to get to EOB as well as scooting bottom.    Transfers                          Balance Overall balance assessment: Needs assistance Sitting-balance support: Feet supported, Bilateral upper extremity supported Sitting balance-Leahy Scale: Poor Sitting balance - Comments: Requires Mod-max A for sitting balance, favors left lateral lean progressing to Min A for a few seconds at a time. Postural control: Left lateral lean, Posterior lean   Standing balance-Leahy Scale: Zero Standing balance comment: assist of 2                           ADL either performed or assessed with clinical judgement   ADL Overall ADL's : Needs assistance/impaired     Grooming: Maximal assistance;Sitting           Upper Body Dressing : Maximal assistance;Sitting   Lower Body Dressing: Total assistance;+2 for physical  assistance;+2 for safety/equipment;Sit to/from stand;Sitting/lateral leans   Toilet Transfer: Maximal assistance;Squat-pivot Toilet Transfer Details (indicate cue type and reason): simulated to recliner          Functional mobility during ADLs: Maximal assistance;+2 for physical assistance;+2 for safety/equipment;Cueing for safety;Cueing for sequencing       Vision   Vision Assessment?: No apparent visual deficits     Perception     Praxis      Pertinent Vitals/Pain Pain Assessment Pain Assessment: Faces Faces Pain Scale: Hurts little more Pain Location: bottom with movement Pain Descriptors / Indicators: Grimacing, Guarding, Discomfort Pain Intervention(s): Limited activity within patient's tolerance, Monitored during session, Repositioned     Hand Dominance Right   Extremity/Trunk Assessment Upper Extremity Assessment Upper Extremity Assessment: LUE deficits/detail;Generalized weakness LUE Deficits / Details: edema and weakness, grossly 3-/5 MMT LUE Coordination: decreased fine motor;decreased gross motor   Lower Extremity Assessment Lower Extremity Assessment: Defer to PT evaluation   Cervical / Trunk Assessment Cervical / Trunk Assessment: Kyphotic   Communication Communication Communication: No difficulties   Cognition Arousal/Alertness: Awake/alert Behavior During Therapy: WFL for tasks assessed/performed Overall Cognitive Status: Impaired/Different from baseline Area of Impairment: Orientation, Memory, Following commands, Safety/judgement, Problem solving, Awareness                 Orientation Level: Disoriented to, Time, Situation   Memory: Decreased recall of precautions, Decreased short-term memory Following Commands: Follows one step commands with increased time, Follows one step commands consistently Safety/Judgement: Decreased awareness of safety, Decreased awareness of deficits Awareness: Emergent Problem Solving: Requires verbal cues, Difficulty sequencing, Requires tactile cues, Decreased initiation, Slow processing General Comments: oriented to self, reports June then July.  Follows simple 1 step commands, with increased time.  Poor awareness of  safety and deficits.     General Comments  Pt on 4L supplemental O2 via [place], VSS. BP pre 127/73, BP post 115/88    Exercises     Shoulder Instructions      Home Living Family/patient expects to be discharged to:: Inpatient rehab Living Arrangements: Spouse/significant other Available Help at Discharge: Family;Available PRN/intermittently                         Home Equipment: Rollator (4 wheels)          Prior Functioning/Environment Prior Level of Function : Independent/Modified Independent             Mobility Comments: Uses rollator for ambulation PTA, limited ambulator due to SOB ADLs Comments: reports independnet ADLs, limited iADLs        OT Problem List: Decreased strength;Decreased range of motion;Decreased activity tolerance;Decreased coordination;Decreased cognition;Impaired balance (sitting and/or standing);Decreased safety awareness;Decreased knowledge of use of DME or AE;Decreased knowledge of precautions;Increased edema;Impaired UE functional use      OT Treatment/Interventions: Self-care/ADL training;Therapeutic exercise;DME and/or AE instruction;Therapeutic activities;Balance training;Patient/family education;Cognitive remediation/compensation    OT Goals(Current goals can be found in the care plan section) Acute Rehab OT Goals Patient Stated Goal: home OT Goal Formulation: With patient Time For Goal Achievement: 08/12/21 Potential to Achieve Goals: Good  OT Frequency: Min 2X/week    Co-evaluation PT/OT/SLP Co-Evaluation/Treatment: Yes Reason for Co-Treatment: Complexity of the patient's impairments (multi-system involvement);For patient/therapist safety;To address functional/ADL transfers;Necessary to address cognition/behavior during functional activity   OT goals addressed during session: ADL's and self-care      AM-PAC OT "6 Clicks" Daily Activity     Outcome Measure Help from another person eating meals?: A  Lot Help from another  person taking care of personal grooming?: A Lot Help from another person toileting, which includes using toliet, bedpan, or urinal?: A Lot Help from another person bathing (including washing, rinsing, drying)?: A Lot Help from another person to put on and taking off regular upper body clothing?: A Lot Help from another person to put on and taking off regular lower body clothing?: Total 6 Click Score: 11   End of Session Equipment Utilized During Treatment: Gait belt;Oxygen Nurse Communication: Mobility status;Need for lift equipment  Activity Tolerance: Patient tolerated treatment well Patient left: in chair;with call bell/phone within reach;with chair alarm set  OT Visit Diagnosis: Other abnormalities of gait and mobility (R26.89);Muscle weakness (generalized) (M62.81);Other symptoms and signs involving cognitive function                Time: 5400-8676 OT Time Calculation (min): 36 min Charges:  OT General Charges $OT Visit: 1 Visit OT Evaluation $OT Eval Moderate Complexity: 1 Mod  Jolaine Artist, OT Acute Rehabilitation Services Pager [phone] Office [phone]   Delight Stare 07/29/2021, 1:22 PM

## 2021-07-29 NOTE — Progress Notes (Signed)
Inpatient Rehab Admissions Coordinator Note:   Per therapy recommendations patient was screened for CIR candidacy by Michel Santee, PT. At this time, pt appears to be a potential candidate for CIR. I will place an order for rehab consult for full assessment, per our protocol.  Please contact me any with questions.Shann Medal, PT, DPT [phone] 07/29/21 3:29 PM

## 2021-07-29 NOTE — Evaluation (Signed)
Physical Therapy Evaluation Patient Details Name: Zachary Briggs MRN: [id number] DOB: 10-03-1955 Today's Date: 07/29/2021  History of Present Illness  Patient is a 66 y/o male who presents on 5/9 with cough and SOB, with concern for ARDS, intubated 5/12-5/20. Admitted with acute respiratory failure secondary to PNA, toxic metabolic encephalopathy in the setting of critical illness, delirium and anxiety. PMH includes Hep C, HTN, prostate ca, tobacco abuse.  Clinical Impression  Patient presents with generalized weakness, impaired balance, cognitive deficits and impaired mobility s/p above. Pt is from home with wife and reports being a minimal household ambulator using rollator for support. Today, pt requires Max-total A of 2 for bed mobility, partial standing and squat pivot transfer to chair. Requires assist for sitting balance, with left lateral and posterior bias. Pt highly motivated to mobilize and return to PLOF. Would benefit from AIR to maximize independence and mobility prior to return home. Will follow acutely.       Recommendations for follow up therapy are one component of a multi-disciplinary discharge planning process, led by the attending physician.  Recommendations may be updated based on patient status, additional functional criteria and insurance authorization.  Follow Up Recommendations Acute inpatient rehab (3hours/day)    Assistance Recommended at Discharge Frequent or constant Supervision/Assistance  Patient can return home with the following  Two people to help with walking and/or transfers;Two people to help with bathing/dressing/bathroom;Help with stairs or ramp for entrance;Assist for transportation;Direct supervision/assist for financial management;Assistance with cooking/housework;Assistance with feeding    Equipment Recommendations Hospital bed;Wheelchair (measurements PT);Wheelchair cushion (measurements PT);BSC/3in1  Recommendations for Other Services        Functional Status Assessment Patient has had a recent decline in their functional status and demonstrates the ability to make significant improvements in function in a reasonable and predictable amount of time.     Precautions / Restrictions Precautions Precautions: Fall;Other (comment) Precaution Comments: watch 02, NG tube, rectal pouch Restrictions Weight Bearing Restrictions: No      Mobility  Bed Mobility Overal bed mobility: Needs Assistance Bed Mobility: Rolling, Sidelying to Sit Rolling: Max assist, +2 for physical assistance Sidelying to sit: Max assist, +2 for physical assistance, HOB elevated       General bed mobility comments: Step by step cues for sequencing, assist with LEs, to reach for rail and for trunk to get to EOB as well as scooting bottom.    Transfers Overall transfer level: Needs assistance Equipment used: Rolling walker (2 wheels) Transfers: Sit to/from Stand, Bed to chair/wheelchair/BSC Sit to Stand: Max assist, +2 physical assistance     Squat pivot transfers: +2 physical assistance, Total assist     General transfer comment: Assist to power to standing from EOB x1, flexed at hips/trunk. Difficulty getting fully upright. Total A squat pivot transfer towards left to get to chair. Able to assist wtih scooting bottom better in chair with cues.    Ambulation/Gait               General Gait Details: Unable  Stairs            Wheelchair Mobility    Modified Rankin (Stroke Patients Only)       Balance Overall balance assessment: Needs assistance Sitting-balance support: Feet supported, Bilateral upper extremity supported Sitting balance-Leahy Scale: Poor Sitting balance - Comments: Requires Mod-max A for sitting balance, favors left lateral lean progressing to Min A for a few seconds at a time. Postural control: Left lateral lean, Posterior lean   Standing balance-Leahy  Scale: Zero Standing balance comment: assist of 2                              Pertinent Vitals/Pain Pain Assessment Pain Assessment: Faces Faces Pain Scale: Hurts little more Pain Location: bottom with movement Pain Descriptors / Indicators: Grimacing, Guarding, Discomfort Pain Intervention(s): Monitored during session, Repositioned    Home Living Family/patient expects to be discharged to:: Inpatient rehab Living Arrangements: Spouse/significant other Available Help at Discharge: Family;Available PRN/intermittently Type of Home: (P) House Home Access: (P) Stairs to enter Entrance Stairs-Rails: (P) Right Entrance Stairs-Number of Steps: (P) 4   Home Layout: (P) One level Home Equipment: Rollator (4 wheels)      Prior Function Prior Level of Function : Independent/Modified Independent             Mobility Comments: Uses rollator for ambulation PTA, limited ambulator due to SOB       Hand Dominance        Extremity/Trunk Assessment   Upper Extremity Assessment Upper Extremity Assessment: Defer to OT evaluation;Generalized weakness (Swelling present LUE)    Lower Extremity Assessment Lower Extremity Assessment:  (Able to lift against gravity sitting EOB doing LAQ)    Cervical / Trunk Assessment Cervical / Trunk Assessment: Kyphotic  Communication   Communication: No difficulties  Cognition Arousal/Alertness: Awake/alert Behavior During Therapy: WFL for tasks assessed/performed Overall Cognitive Status: Impaired/Different from baseline Area of Impairment: Orientation, Memory, Following commands, Safety/judgement, Problem solving                 Orientation Level: Disoriented to, Time, Situation   Memory: Decreased short-term memory Following Commands: Follows one step commands with increased time, Follows one step commands consistently Safety/Judgement: Decreased awareness of safety, Decreased awareness of deficits   Problem Solving: Requires verbal cues, Difficulty sequencing, Requires tactile  cues, Decreased initiation, Slow processing General Comments: Oriented to self, place, thinks it is June, and then July even with contextual cues.        General Comments General comments (skin integrity, edema, etc.): VSS on 4L/min 02 [place], not the best wave form throughout session. BP pre activity 127/73, post activity BP 115/88.    Exercises     Assessment/Plan    PT Assessment Patient needs continued PT services  PT Problem List Decreased strength;Decreased mobility;Pain;Decreased balance;Decreased activity tolerance;Decreased range of motion;Decreased cognition;Cardiopulmonary status limiting activity       PT Treatment Interventions Patient/family education;Therapeutic activities;Functional mobility training;Balance training;Gait training;DME instruction;Wheelchair mobility training;Therapeutic exercise;Neuromuscular re-education;Cognitive remediation    PT Goals (Current goals can be found in the Care Plan section)  Acute Rehab PT Goals Patient Stated Goal: to get stronger, go to rehab PT Goal Formulation: With patient Time For Goal Achievement: 08/12/21 Potential to Achieve Goals: Fair    Frequency Min 3X/week     Co-evaluation PT/OT/SLP Co-Evaluation/Treatment: Yes Reason for Co-Treatment: Complexity of the patient's impairments (multi-system involvement);For patient/therapist safety;To address functional/ADL transfers PT goals addressed during session: Mobility/safety with mobility;Strengthening/ROM;Proper use of DME;Balance         AM-PAC PT "6 Clicks" Mobility  Outcome Measure Help needed turning from your back to your side while in a flat bed without using bedrails?: A Lot Help needed moving from lying on your back to sitting on the side of a flat bed without using bedrails?: Total Help needed moving to and from a bed to a chair (including a wheelchair)?: Total Help needed standing up from a chair  using your arms (e.g., wheelchair or bedside chair)?: Total Help  needed to walk in hospital room?: Total Help needed climbing 3-5 steps with a railing? : Total 6 Click Score: 7    End of Session Equipment Utilized During Treatment: Gait belt;Oxygen Activity Tolerance: Patient tolerated treatment well Patient left: in chair;with call bell/phone within reach;with chair alarm set Nurse Communication: Mobility status;Need for lift equipment (lift pad under patient) PT Visit Diagnosis: Pain;Muscle weakness (generalized) (M62.81);Difficulty in walking, not elsewhere classified (R26.2);Unsteadiness on feet (R26.81) Pain - part of body:  (bottom)    Time: 9471-2527 PT Time Calculation (min) (ACUTE ONLY): 36 min   Charges:   PT Evaluation $PT Eval Moderate Complexity: 1 Mod          Marisa Severin, PT, DPT Acute Rehabilitation Services Secure chat preferred Office [phone]     Marguarite Arbour A Sabra Heck 07/29/2021, 12:29 PM

## 2021-07-30 DIAGNOSIS — R531 Weakness: Secondary | ICD-10-CM | POA: Diagnosis not present

## 2021-07-30 DIAGNOSIS — F17290 Nicotine dependence, other tobacco product, uncomplicated: Secondary | ICD-10-CM

## 2021-07-30 DIAGNOSIS — J9601 Acute respiratory failure with hypoxia: Secondary | ICD-10-CM | POA: Diagnosis not present

## 2021-07-30 DIAGNOSIS — J189 Pneumonia, unspecified organism: Secondary | ICD-10-CM | POA: Diagnosis not present

## 2021-07-30 LAB — BASIC METABOLIC PANEL
Anion gap: 8 (ref 5–15)
BUN: 24 mg/dL — ABNORMAL HIGH (ref 8–23)
CO2: 26 mmol/L (ref 22–32)
Calcium: 8.8 mg/dL — ABNORMAL LOW (ref 8.9–10.3)
Chloride: 105 mmol/L (ref 98–111)
Creatinine, Ser: 0.94 mg/dL (ref 0.61–1.24)
GFR, Estimated: >60 mL/min (ref >60)
Glucose, Bld: 152 mg/dL — ABNORMAL HIGH (ref 70–99)
Potassium: 3.8 mmol/L (ref 3.5–5.1)
Sodium: 139 mmol/L (ref 135–145)

## 2021-07-30 LAB — CBC WITH DIFFERENTIAL/PLATELET
Abs Immature Granulocytes: 0.12 10*3/uL — ABNORMAL HIGH (ref 0.00–0.07)
Basophils Absolute: 0.1 10*3/uL (ref 0.0–0.1)
Basophils Relative: 1 %
Eosinophils Absolute: 0.2 10*3/uL (ref 0.0–0.5)
Eosinophils Relative: 1 %
HCT: 37.6 % — ABNORMAL LOW (ref 39.0–52.0)
Hemoglobin: 12 g/dL — ABNORMAL LOW (ref 13.0–17.0)
Immature Granulocytes: 1 %
Lymphocytes Relative: 19 %
Lymphs Abs: 3.2 10*3/uL (ref 0.7–4.0)
MCH: 27 pg (ref 26.0–34.0)
MCHC: 31.9 g/dL (ref 30.0–36.0)
MCV: 84.7 fL (ref 80.0–100.0)
Monocytes Absolute: 1 10*3/uL (ref 0.1–1.0)
Monocytes Relative: 6 %
Neutro Abs: 12.5 10*3/uL — ABNORMAL HIGH (ref 1.7–7.7)
Neutrophils Relative %: 72 %
Platelets: 342 10*3/uL (ref 150–400)
RBC: 4.44 MIL/uL (ref 4.22–5.81)
RDW: 15.7 % — ABNORMAL HIGH (ref 11.5–15.5)
WBC: 17.1 10*3/uL — ABNORMAL HIGH (ref 4.0–10.5)
nRBC: 0 % (ref 0.0–0.2)

## 2021-07-30 LAB — GLUCOSE, CAPILLARY
Glucose-Capillary: 108 mg/dL — ABNORMAL HIGH (ref 70–99)
Glucose-Capillary: 111 mg/dL — ABNORMAL HIGH (ref 70–99)
Glucose-Capillary: 148 mg/dL — ABNORMAL HIGH (ref 70–99)
Glucose-Capillary: 160 mg/dL — ABNORMAL HIGH (ref 70–99)
Glucose-Capillary: 177 mg/dL — ABNORMAL HIGH (ref 70–99)
Glucose-Capillary: 177 mg/dL — ABNORMAL HIGH (ref 70–99)

## 2021-07-30 MED ORDER — IPRATROPIUM-ALBUTEROL 0.5-2.5 (3) MG/3ML IN SOLN: 3.0000 mL | Freq: Four times a day (QID) | RESPIRATORY_TRACT | Status: DC | PRN

## 2021-07-30 MED ORDER — ENSURE ENLIVE PO LIQD
237.0000 mL | Freq: Three times a day (TID) | ORAL | Status: DC
  Administered 2021-07-30 – 2021-07-31 (×3): 237 mL via ORAL

## 2021-07-30 MED ORDER — NICOTINE 21 MG/24HR TD PT24
21.0000 mg | MEDICATED_PATCH | Freq: Every day | TRANSDERMAL | Status: DC
  Administered 2021-07-30 – 2021-07-31 (×2): 21 mg via TRANSDERMAL
  Filled 2021-07-30 (×2): qty 1

## 2021-07-30 NOTE — Progress Notes (Signed)
Insisted to go outside  with stepson . Instructed  not to till we get an order from MD. MD talked to the pt and advised not to. Called daughter Magnus Ivan  to talk to him ,becoming  aggressive after the talk.  Settled down after a long talk with him and made comfortable on bed. Continue to monitor.

## 2021-07-30 NOTE — Progress Notes (Signed)
Brought back from outside by his son by wheelchair.,with no problems.

## 2021-07-30 NOTE — Progress Notes (Signed)
Subjective:  Overnight:NAEON  Pt is seen at bedside during rounds today. He denies any chest pain, shortness of breath, and abdominal pain. He complains of pain in his nose from tube. He reports eating well, however family at bedside report he is not eating and breakfast tray at bedside appears untouched. He reports feeling well. He inquires about cbg monitoring. Encouraged PO intake.   All questions and concerns are addressed.   Objective:  Vital signs in last 24 hours: normotensive, afebrile satting well on 4 L [place]. Vitals:   07/29/21 1912 07/30/21 0010 07/30/21 0430 07/30/21 0457  BP:  123/76 132/77   Pulse:  78 74   Resp:   17   Temp:   98.3 F (36.8 C)   TempSrc:   Oral   SpO2: 97%  97%   Weight:    83.4 kg  Height:       Supplemental O2: Nasal Cannula SpO2: 97 % O2 Flow Rate (L/min): 4 L/min FiO2 (%): 40 % Filed Weights   07/28/21 0500 07/29/21 0448 07/30/21 0457  Weight: 82.6 kg 83.1 kg 83.4 kg    Intake/Output Summary (Last 24 hours) at 07/30/2021 0619 Last data filed at 07/30/2021 0504 Gross per 24 hour  Intake 2302.58 ml  Output 1850 ml  Net 452.58 ml   Net IO Since Admission: 1,465.81 mL [07/30/21 0619] Physical Exam General: NAD, laying in recliner on exam HENT: NCAT, [place] in place Lungs:  CTAB on anterior auscultation Cardiovascular: NS rate and rhythm, 2+ radial pulse GI: No TTP, Had rectal tube in place on initial eval but later removed MSK: no asymmetry  Skin: no lesions on exposed skin Neuro: alert and oriented x4. 5/5 strength in RUE, weakness present in LUE, 5/5 in BLE.  Psych: normal mood and normal affect  Diagnostics CBC shows stable leukocytosis of 17k, and hgb at 12.0. BMP shows normal electrolytes, BUN 24, Cr 0.94.    Assessment/Plan: Hospital day #14 for [place]  a 66 y.o. male past medical hx of tobacco use disorder, HTN, moderate aortic valve stenosis NSVT/PSVT with implantable loop recorder who presented 5/9 to ED with  acute hypoxic respiratory failure secondary 2/2 to COPD exacerbation in setting of multifocal pneumonia complicated by ARDS. He was transferred to the ICU on 05/10 and required intubation on 05/12. He was given IV abx including Ceftriaxone, azithromycin and linezolid and solumedrol with taper. Patient extubated on 5/20 and transferred to the progressive unit on 5/21. Acute metabolic encephalopathy resolved.  Active Problems:   Hypertension   ARDS (adult respiratory distress syndrome) (HCC)   Type 2 diabetes mellitus with hyperglycemia ([place])   Community acquired pneumonia   Weakness acquired in ICU  #Acute hypoxic respiratory failure 2/2 culture negative community acquire pneumonia  Respiratory panel by PCR negative, strep urine antigen negative , legionella negative . S/p antibiotic 7 days of Ceftriaxone and Linezolid. On 4 L supplemental oxygen via  this am. Continues to have thick secretions. Encourage to use flutter valve and spirometer. Patient finished prednisone today. Hopefully breathing will improve with increased activity in CIR. - Wean supplemental oxygen to maintain O2 Sat > 92% -CTM   #Weakness acquired in ICU Patient critically ill on this admission and now in recovery phase. Encephalopathy improved. Encouraging use of spirometer and flutter valve. Strength improving. PT/OT recommends IPR. SLP cleared for diet. NG tube removed as patient is eating well. Rectal tube removed as patient does not have diarrhea per discussion with the nurse.  -  Pending CIR placement.   #T2DM Chronic. Hemoglobin A1c 6.6 on admission, Prior history of prediabetes. Will discontinue all scheduled insulin and keep SSI as patient is off tube feeds.  - Continue SSI - CTM   #HTN Chronic. Home medications are amlodipine 5 mg daily, carvedilol 6.25 mg BID, and Valsartan 240 mg daily. Normotensive on amlodipine 10 mgm Carvedilol 25 mg BID, and Clonidine 1 BID. Clonidine stopped yesterday. Kidney function  improved, and restart home Valsartan if patient becomes hypertensive. He is currently normotensive.  -Continue meds as above.  -Start valsartan if pt becomes hypertensive -CTM his blood pressure  #MDD Chronic. Continue home Zoloft. Clonazepam and Quetiapine stopped as they were added on in an acute setting that has resolved.  -Continue home zoloft -CTM   Diet: Regular IVF: None VTE: Lovenox Code: Full PT/OT recs: CIR Prior to Admission Living Arrangement: Home Anticipated Discharge Location: CIR Barriers to Discharge: Medical Stability Dispo: Anticipated discharge to CIR in approximately 1-2 day(s).   Idamae Schuller, MD Tillie Rung. [facility] Internal Medicine Residency, PGY-1  Pager: [phone] After 5 pm and on weekends: Please call the on-call pager

## 2021-07-30 NOTE — Progress Notes (Signed)
During shift change found pt sitting in the chair, wife claimed that it was pt's son who assisted him to the chair. Spoke with wife and son  and instructed them to notify staff in getting the pt out of bed since pt  has  left sided weakness. Noted flexiseal came out while sitting in chair and refused to be placed back. MD made aware. Back to bed at 10am with the use of hoyer lift. Cleansed and made comfortable on bed.

## 2021-07-30 NOTE — Hospital Course (Signed)
HPI per Dr. Charleen Kirks Mr. Zachary Briggs is a 66 y/o male with a PMHx of HTN, aortic valve stenosis, NVST/PSVT w/ loop recorder placed, prostate carcinoma, BPH, PTSD, MDD who presents to the ED with c/o shortness of breathe.   Zachary Briggs states that on Sunday, May 7, he developed rapidly progressive shortness of breath that was unrelieved by any particular position.  He developed a cough that was productive but he is unable to describe his sputum.  He endorses feeling hot and cold, but was unable to check his temperature.  Additional symptoms that he was experiencing at the time include polyarthralgia, abdominal bloating, and chest pain with cough.  He denies any rhinorrhea, congestion, nausea, vomiting, diarrhea, extremity swelling, palpitations.     Zachary Briggs denies any prior history of pulmonary disease, but notes he has been smoking for "his entire life."  He denies formal diagnosis of COPD or any prior episodes of pneumonia.  In addition, he used cocaine via inhalation the day before symptom onset; he denies any prior breathing difficulties when using cocaine.  He was satting at 40s in the ED. Was febrile and had leukocytosis. CXR showed multifocal pneumonia. Was started on ceftriaxone, azithromycin and Bipap. He was admitted to the ICU on 05/10 for worsening oxygen requirement and being a full code. Later he was intubated in the ICU on 05/12. MRSA swab positive. Patient started on linezolid. He was given IV solumedrol 60 mg BID. Imaging suggestive of ARDS. He was given lasix. Respiratory status improved and patient extubated on 05/20. He was satting well on 4 L [place]. He was transferred out of the ICU on 05/22. Steroid taper ended on 05/23. PT/OT recommend IPR.

## 2021-07-30 NOTE — Progress Notes (Signed)
Inpatient Rehab Admissions Coordinator:   Met with patient at bedside to discuss CIR recommendations and goals/expectations of CIR stay.  We reviewed 3 hrs/day of therapy, physician follow, and average length of stay 2 weeks.  We discussed insurance auth process.  Pt states he does have VA coverage, but it does not appear the hospital is billing [place] and we would not be able to get auth without an approved hospital stay.  I explained insurance approval process with [facility] Medicare.  I reviewed likely need for 24/7 supervision initially, at discharge from [place], and pt states he's home with his wife who is retired and can provide supervision.  I will try to call her and give her an update as well.  Will start insurance auth with [facility] Medicare today.   Shann Medal, PT, DPT Admissions Coordinator [phone] 07/30/21  3:25 PM

## 2021-07-30 NOTE — Plan of Care (Signed)
  Problem: Activity: Goal: Ability to tolerate increased activity will improve Outcome: Progressing   Problem: Clinical Measurements: Goal: Ability to maintain a body temperature in the normal range will improve Outcome: Progressing   Problem: Respiratory: Goal: Ability to maintain adequate ventilation will improve Outcome: Progressing Goal: Ability to maintain a clear airway will improve Outcome: Progressing   Problem: Education: Goal: Knowledge of General Education information will improve Description: Including pain rating scale, medication(s)/side effects and non-pharmacologic comfort measures Outcome: Progressing   Problem: Health Behavior/Discharge Planning: Goal: Ability to manage health-related needs will improve Outcome: Progressing   Problem: Clinical Measurements: Goal: Ability to maintain clinical measurements within normal limits will improve Outcome: Progressing Goal: Will remain free from infection Outcome: Progressing Goal: Diagnostic test results will improve Outcome: Progressing Goal: Respiratory complications will improve Outcome: Progressing Goal: Cardiovascular complication will be avoided Outcome: Progressing   Problem: Activity: Goal: Risk for activity intolerance will decrease Outcome: Progressing   Problem: Nutrition: Goal: Adequate nutrition will be maintained Outcome: Progressing   Problem: Coping: Goal: Level of anxiety will decrease Outcome: Progressing   Problem: Elimination: Goal: Will not experience complications related to bowel motility Outcome: Progressing Goal: Will not experience complications related to urinary retention Outcome: Progressing   Problem: Pain Managment: Goal: General experience of comfort will improve Outcome: Progressing   Problem: Safety: Goal: Ability to remain free from injury will improve Outcome: Progressing   Problem: Skin Integrity: Goal: Risk for impaired skin integrity will decrease Outcome:  Progressing   Problem: Safety: Goal: Non-violent Restraint(s) Outcome: Progressing

## 2021-07-30 NOTE — Progress Notes (Signed)
Went to his room and found pt not in there and wife claimed that his son placed him in a wheelchair and  wheeled him out side.  Discussed this matter with unit director who will be going to talk to them.

## 2021-07-31 DIAGNOSIS — E1165 Type 2 diabetes mellitus with hyperglycemia: Secondary | ICD-10-CM

## 2021-07-31 DIAGNOSIS — J189 Pneumonia, unspecified organism: Secondary | ICD-10-CM | POA: Diagnosis not present

## 2021-07-31 DIAGNOSIS — J9601 Acute respiratory failure with hypoxia: Secondary | ICD-10-CM | POA: Diagnosis not present

## 2021-07-31 DIAGNOSIS — J80 Acute respiratory distress syndrome: Secondary | ICD-10-CM | POA: Diagnosis not present

## 2021-07-31 LAB — BASIC METABOLIC PANEL
Anion gap: 9 (ref 5–15)
BUN: 20 mg/dL (ref 8–23)
CO2: 21 mmol/L — ABNORMAL LOW (ref 22–32)
Calcium: 8.7 mg/dL — ABNORMAL LOW (ref 8.9–10.3)
Chloride: 109 mmol/L (ref 98–111)
Creatinine, Ser: 0.95 mg/dL (ref 0.61–1.24)
GFR, Estimated: >60 mL/min (ref >60)
Glucose, Bld: 145 mg/dL — ABNORMAL HIGH (ref 70–99)
Potassium: 3.8 mmol/L (ref 3.5–5.1)
Sodium: 139 mmol/L (ref 135–145)

## 2021-07-31 LAB — CBC
HCT: 37.3 % — ABNORMAL LOW (ref 39.0–52.0)
Hemoglobin: 11.6 g/dL — ABNORMAL LOW (ref 13.0–17.0)
MCH: 26.7 pg (ref 26.0–34.0)
MCHC: 31.1 g/dL (ref 30.0–36.0)
MCV: 85.7 fL (ref 80.0–100.0)
Platelets: 326 10*3/uL (ref 150–400)
RBC: 4.35 MIL/uL (ref 4.22–5.81)
RDW: 15.7 % — ABNORMAL HIGH (ref 11.5–15.5)
WBC: 18.3 10*3/uL — ABNORMAL HIGH (ref 4.0–10.5)
nRBC: 0 % (ref 0.0–0.2)

## 2021-07-31 LAB — GLUCOSE, CAPILLARY
Glucose-Capillary: 134 mg/dL — ABNORMAL HIGH (ref 70–99)
Glucose-Capillary: 148 mg/dL — ABNORMAL HIGH (ref 70–99)
Glucose-Capillary: 158 mg/dL — ABNORMAL HIGH (ref 70–99)
Glucose-Capillary: 177 mg/dL — ABNORMAL HIGH (ref 70–99)

## 2021-07-31 MED ORDER — CARVEDILOL 25 MG PO TABS
25.0000 mg | ORAL_TABLET | Freq: Two times a day (BID) | ORAL | 0 refills | Status: DC
Start: 2021-07-31 — End: 2022-07-13

## 2021-07-31 MED ORDER — OMEPRAZOLE 20 MG PO CPDR: 20.0000 mg | DELAYED_RELEASE_CAPSULE | Freq: Every day | ORAL | 0 refills | Status: DC

## 2021-07-31 MED ORDER — SERTRALINE HCL 50 MG PO TABS
150.0000 mg | ORAL_TABLET | Freq: Every day | ORAL | 0 refills | Status: DC
Start: 2021-08-01 — End: 2022-07-18

## 2021-07-31 MED ORDER — ATORVASTATIN CALCIUM 40 MG PO TABS: 40.0000 mg | ORAL_TABLET | Freq: Every day | ORAL | 0 refills | Status: DC

## 2021-07-31 MED ORDER — PREGABALIN 200 MG PO CAPS: 200.0000 mg | ORAL_CAPSULE | Freq: Two times a day (BID) | ORAL | 0 refills | Status: AC

## 2021-07-31 MED ORDER — VALSARTAN 80 MG PO TABS: 80.0000 mg | ORAL_TABLET | Freq: Three times a day (TID) | ORAL | 0 refills | Status: AC

## 2021-07-31 MED ORDER — TAMSULOSIN HCL 0.4 MG PO CAPS: 0.4000 mg | ORAL_CAPSULE | Freq: Every day | ORAL | 0 refills | Status: AC

## 2021-07-31 MED ORDER — AMLODIPINE BESYLATE 10 MG PO TABS: 10.0000 mg | ORAL_TABLET | Freq: Every day | ORAL | 0 refills | Status: AC

## 2021-07-31 NOTE — Progress Notes (Signed)
RN went over d/c summary w/ pt and NT removed PIV. Belongings w/ pt. NT transporting pt to private vehicle where pt's daughter will transport pt home

## 2021-07-31 NOTE — Progress Notes (Signed)
Occupational Therapy Treatment Patient Details Name: Zachary Briggs MRN: [id number] DOB: [birth year] Today's Date: 07/31/2021   History of present illness Patient is a 66 y/o male who presents on 5/9 with cough and SOB, with concern for ARDS, intubated 5/12-5/20. Admitted with acute respiratory failure secondary to PNA, toxic metabolic encephalopathy in the setting of critical illness, delirium and anxiety. PMH includes Hep C, HTN, prostate ca, tobacco abuse.   OT comments  Patient received sitting on EOB and eager to return home. Patient provided paper scrubs and was able to donn with min assist for LB dressing. Patient performed toilet transfer with RW to bathroom and min guard with verbal cues for safety and hand placement. Patient has made great gains since last visit. Patient has declined AIR and will return home with [place].    Recommendations for follow up therapy are one component of a multi-disciplinary discharge planning process, led by the attending physician.  Recommendations may be updated based on patient status, additional functional criteria and insurance authorization.    Follow Up Recommendations  Home health OT    Assistance Recommended at Discharge Intermittent Supervision/Assistance  Patient can return home with the following  A little help with walking and/or transfers;A little help with bathing/dressing/bathroom;Assistance with cooking/housework;Direct supervision/assist for medications management;Direct supervision/assist for financial management   Equipment Recommendations  None recommended by OT    Recommendations for Other Services      Precautions / Restrictions Precautions Precautions: Fall;Other (comment) Precaution Comments: watch 02 Restrictions Weight Bearing Restrictions: No       Mobility Bed Mobility Overal bed mobility: Needs Assistance             General bed mobility comments: Sitting on EOB upon arrival    Transfers Overall transfer  level: Needs assistance Equipment used: Rolling walker (2 wheels) Transfers: Sit to/from Stand Sit to Stand: Min guard, Min assist           General transfer comment: min guard for safety with cues for hand placement to power up and to reach back to sit     Balance Overall balance assessment: Needs assistance Sitting-balance support: Feet supported, No upper extremity supported Sitting balance-Leahy Scale: Good Sitting balance - Comments: Able to assist with donning pants sitting EOB with some assist, leaning outside BoS.   Standing balance support: During functional activity Standing balance-Leahy Scale: Fair Standing balance comment: able to assist wtih pulling up clothing while standing                           ADL either performed or assessed with clinical judgement   ADL Overall ADL's : Needs assistance/impaired     Grooming: Wash/dry face;Set up           Upper Body Dressing : Supervision/safety;Sitting Upper Body Dressing Details (indicate cue type and reason): on EOB Lower Body Dressing: Minimal assistance;Sit to/from stand Lower Body Dressing Details (indicate cue type and reason): assistance threading legs into pants Toilet Transfer: Min guard;Regular Toilet;Rolling walker (2 wheels) Toilet Transfer Details (indicate cue type and reason): verbal cues for safety and hand placement           General ADL Comments: patient able to performe self care tasks with min assist for LB and supervision to setup for UB    Extremity/Trunk Assessment              Vision       Perception     Praxis  Cognition Arousal/Alertness: Awake/alert Behavior During Therapy: WFL for tasks assessed/performed Overall Cognitive Status: No family/caregiver present to determine baseline cognitive functioning                                 General Comments: more oriented and eager to return home        Exercises      Shoulder  Instructions       General Comments Sp02 remained >90% on RA during activity.    Pertinent Vitals/ Pain       Pain Assessment Pain Assessment: No/denies pain Pain Intervention(s): Monitored during session  Home Living                                          Prior Functioning/Environment              Frequency  Min 2X/week        Progress Toward Goals  OT Goals(current goals can now be found in the care plan section)  Progress towards OT goals: Progressing toward goals  Acute Rehab OT Goals Patient Stated Goal: go home OT Goal Formulation: With patient Time For Goal Achievement: 08/12/21 Potential to Achieve Goals: Good ADL Goals Pt Will Perform Grooming: with min guard assist;sitting Pt Will Perform Upper Body Bathing: sitting;with min assist Pt Will Perform Lower Body Dressing: with max assist;sit to/from stand;with adaptive equipment Pt Will Transfer to Toilet: with mod assist;with +2 assist;stand pivot transfer;bedside commode Pt/caregiver will Perform Home Exercise Program: Increased strength;Both right and left upper extremity Additional ADL Goal #1: Pt will follow 2 step command with increased time during ADL task.  Plan Discharge plan needs to be updated    Co-evaluation    PT/OT/SLP Co-Evaluation/Treatment: Yes Reason for Co-Treatment: For patient/therapist safety;To address functional/ADL transfers PT goals addressed during session: Mobility/safety with mobility;Proper use of DME;Strengthening/ROM;Balance OT goals addressed during session: ADL's and self-care      AM-PAC OT "6 Clicks" Daily Activity     Outcome Measure   Help from another person eating meals?: None Help from another person taking care of personal grooming?: A Little Help from another person toileting, which includes using toliet, bedpan, or urinal?: A Little Help from another person bathing (including washing, rinsing, drying)?: A Little Help from another  person to put on and taking off regular upper body clothing?: A Little Help from another person to put on and taking off regular lower body clothing?: A Little 6 Click Score: 19    End of Session Equipment Utilized During Treatment: Gait belt;Rolling walker (2 wheels)  OT Visit Diagnosis: Other abnormalities of gait and mobility (R26.89);Muscle weakness (generalized) (M62.81);Other symptoms and signs involving cognitive function   Activity Tolerance Patient tolerated treatment well   Patient Left in chair;with call bell/phone within reach;with chair alarm set   Nurse Communication Mobility status        Time: [phone] OT Time Calculation (min): 24 min  Charges: OT General Charges $OT Visit: 1 Visit OT Treatments $Self Care/Home Management : 8-22 mins  Lodema Hong, Scott  Pager [phone] Office [place] 07/31/2021, 9:33 AM

## 2021-07-31 NOTE — TOC Initial Note (Signed)
Transition of Care [facility]) - Initial/Assessment Note    Patient Details  Name: Zachary Briggs MRN: [id number] Date of Birth: 06/11/1955  [facility]) CM/SW Contact:    Angelita Ingles, RN Phone Number:[phone]  07/31/2021, 1:43 PM  Clinical Narrative:                 TOC follow up for high risk for readmission. CM following up for patient that has already been discharged but CM was not made aware and home health has not been set up. CM called patient to assess for high risk and to set up home health services. CM called patient introduced self and explained need for assessment and to offer choice for home health. Patient states that he lives at home alone but currently his wife and daughter in law are going to be staying with him to assist in his care. Patient states that he does get his meds from the [place] and has PCP at [place]. Patient states that he has no difficulty obtaining meds , has transportation to appointments and has no DME needs. CM offered patient a choice foe home health services. Patient states that he has no preference as long as someone will come. Home health referral has been accepted by [place] with [facility]. There are currently no other needs. Patient states that he is home resting comfortable. TOC will sign off.   Expected Discharge Plan: [place] Barriers to Discharge: No Barriers Identified   Patient Goals and CMS Choice Patient states their goals for this hospitalization and ongoing recovery are:: Patient has already discharged CMS Medicare.gov Compare Post Acute Care list provided to:: Patient Choice offered to / list presented to : Patient  Expected Discharge Plan and Services Expected Discharge Plan: [place] In-house Referral: NA Discharge Planning Services: CM Consult Post Acute Care Choice: [place] arrangements for the past 2 months: Single Family Home Expected Discharge Date: 07/31/21                DME Arranged: N/A DME Agency: NA       HH Arranged: PT, OT [place] Agency: Other - See comment Jackqul[facility] [place]) Date HH Agency Contacted: 07/31/21 Time HH Agency Contacted: [phone] Representative spoke with at [place]: [place] Arrangements/Services Living arrangements for the past 2 months: [place] with:: Self, Spouse (daughter in law to stay for a little while) Patient language and need for interpreter reviewed:: Yes Do you feel safe going back to the place where you live?: Yes      Need for Family Participation in Patient Care: Yes (Comment) Care giver support system in place?: Yes (comment) Current home services:  (n/a) Criminal Activity/Legal Involvement Pertinent to Current Situation/Hospitalization: No - Comment as needed  Activities of Daily Living Home Assistive Devices/Equipment: None ADL Screening (condition at time of admission) Patient's cognitive ability adequate to safely complete daily activities?: Yes Is the patient deaf or have difficulty hearing?: No Does the patient have difficulty seeing, even when wearing glasses/contacts?: No Does the patient have difficulty concentrating, remembering, or making decisions?: No Patient able to express need for assistance with ADLs?: Yes Does the patient have difficulty dressing or bathing?: No Independently performs ADLs?: Yes (appropriate for developmental age) Does the patient have difficulty walking or climbing stairs?: No Weakness of Legs: Both Weakness of Arms/Hands: None  Permission Sought/Granted Permission sought to share information with : Family Supports Permission granted to share  information with : No              Emotional Assessment Appearance:: Other (Comment Required (phone assessment)   Affect (typically observed): Pleasant Orientation: : Oriented to Self, Oriented to Place, Oriented to  Time, Oriented to Situation Alcohol / Substance Use: Not Applicable Psych Involvement:  No (comment)  Admission diagnosis:  Hypokalemia [E87.6] Acute respiratory failure with hypoxia ([place]) [J96.01] Community acquired pneumonia, unspecified laterality [J18.9] Patient Active Problem List   Diagnosis Date Noted   Weakness acquired in ICU 07/29/2021   Acute encephalopathy    Community acquired pneumonia    Hypernatremia 07/21/2021   ARDS (adult respiratory distress syndrome) ([place]) 07/20/2021   Acute on chronic respiratory failure with hypoxia and hypercapnia ([place]) 07/20/2021   Obesity (BMI 30-39.9) 07/20/2021   Type 2 diabetes mellitus with hyperglycemia ([place]) 07/20/2021   Aortic stenosis 12/30/2015   Aortic atherosclerosis ([place]) 12/29/2015   [place] of lung ([place]) 12/29/2015   Chest pain 12/28/2015   Back pain 12/28/2015   Hypertension    Tobacco abuse    PCP:  [place]:   [id number]Phone: [phone]: [phone] [address]Phone: [phone]: [phone]    Social Determinants of Health (SDOH) Interventions    Readmission Risk Interventions    07/31/2021    1:32 PM  Readmission Risk Prevention Plan  Transportation Screening Complete  PCP or Specialist Appt within 3-5 Days Complete  HRI or HBox [place]  Social Work Consult for [place]/Counseling Complete  Palliative Care Screening Not Applicable  Medication Review (Press photographer Referral to Pharmacy

## 2021-07-31 NOTE — Plan of Care (Signed)
  Problem: Clinical Measurements: Goal: Ability to maintain a body temperature in the normal range will improve Outcome: Progressing   Problem: Respiratory: Goal: Ability to maintain adequate ventilation will improve Outcome: Progressing Goal: Ability to maintain a clear airway will improve Outcome: Progressing   Problem: Clinical Measurements: Goal: Respiratory complications will improve Outcome: Progressing Goal: Cardiovascular complication will be avoided Outcome: Progressing   Problem: Coping: Goal: Level of anxiety will decrease Outcome: Progressing   Problem: Elimination: Goal: Will not experience complications related to urinary retention Outcome: Progressing   Problem: Pain Managment: Goal: General experience of comfort will improve Outcome: Progressing   Problem: Skin Integrity: Goal: Risk for impaired skin integrity will decrease Outcome: Progressing   Problem: Safety: Goal: Non-violent Restraint(s) Outcome: Progressing   Problem: Elimination: Goal: Will not experience complications related to bowel motility Outcome: Not Progressing

## 2021-07-31 NOTE — Progress Notes (Signed)
Mobility Specialist Progress Note    07/31/21 1119  Mobility  Activity Ambulated with assistance to bathroom  Level of Assistance Contact guard assist, steadying assist  Assistive Device Front wheel walker  Distance Ambulated (ft) 20 ft (10+10)  Activity Response Tolerated well  $Mobility charge 1 Mobility   Pt received in doorway and agreeable. No complaints. Had BM. Returned to sitting EOB with call bell in reach.    Hildred Alamin Mobility Specialist  Primary: 5N M.S. Phone: [phone] Secondary: 6N M.S. Phone: [phone]

## 2021-07-31 NOTE — Progress Notes (Signed)
Subjective:  Overnight:NAEON  Pt is seen at bedside during rounds today. He reports feeling well but is not pleased to be in the hospital setting d/t the upcoming holiday. Breakfast is at bedside, and he complains that it is cold. He is persistent about going home today to spend time with his family at home. He understands that if he goes home, he would be unable to go to CIR. He agrees to work with PT today for further eval.   Objective:  Vital signs in last 24 hours: normotensive, afebrile satting well on 4 L [place]. Vitals:   07/30/21 2008 07/30/21 2324 07/31/21 0500 07/31/21 0711  BP:  126/84 137/88 (!) 134/102  Pulse:  82 78   Resp:  16 18   Temp:  98.7 F (37.1 C) 98.6 F (37 C) 98 F (36.7 C)  TempSrc:  Oral Oral   SpO2: 94% 95% 94% 96%  Weight:      Height:       Supplemental O2: Nasal Cannula SpO2: 96 % O2 Flow Rate (L/min): 4 L/min FiO2 (%): 40 % Filed Weights   07/28/21 0500 07/29/21 0448 07/30/21 0457  Weight: 82.6 kg 83.1 kg 83.4 kg    Intake/Output Summary (Last 24 hours) at 07/31/2021 0725 Last data filed at 07/31/2021 0100 Gross per 24 hour  Intake 1050 ml  Output 425 ml  Net 625 ml   Net IO Since Admission: 2,090.81 mL [07/31/21 0725] Physical Exam  General: NAD, sitting on side of the bed on exam HENT: NCAT Lungs:  CTAB  Cardiovascular: NS rate and rhythm, 2+ radial pulse GI: No TTP MSK: no asymmetry  Skin: no lesions on exposed skin Neuro: alert and oriented x4. 5/5 strength in BUE, 5/5 in BLE.  Psych: normal mood and normal affect  Diagnostics CBC shows stable leukocytosis of 18k, and hgb at 11.6. BMP shows normal electrolytes, BUN 21, Cr 0.95.    Assessment/Plan: Hospital day #14 for Zachary Briggs  a 66 y.o. male past medical hx of tobacco use disorder, HTN, moderate aortic valve stenosis NSVT/PSVT with implantable loop recorder who presented 5/9 to ED with acute hypoxic respiratory failure secondary 2/2 to COPD exacerbation in setting of  multifocal pneumonia complicated by ARDS. He was transferred to the ICU on 05/10 and required intubation on 05/12. He was given IV abx including Ceftriaxone, azithromycin and linezolid and solumedrol with taper. Patient extubated on 5/20 and transferred to the progressive unit on 5/21. Acute metabolic encephalopathy resolved.  Active Problems:   Hypertension   ARDS (adult respiratory distress syndrome) (HCC)   Type 2 diabetes mellitus with hyperglycemia ([place])   Community acquired pneumonia   Weakness acquired in ICU  #Weakness acquired in ICU Patient critically ill on this admission and now in recovery phase. Encephalopathy improved. Encouraging use of spirometer and flutter valve. Strength improving. PT/OT initially recommend IPR but now recommend HHPT/OT. He is tolerating diet but states the food is not good. NG tube removed as patient is eating well. Home health orders placed and patient insisting on discharge and stable for discharge with HHPT/OT and close family care.   #Acute hypoxic respiratory failure 2/2 culture negative community acquire pneumonia  Resolved. Off supplemental oxygen. Respiratory panel by PCR negative, strep urine antigen negative , legionella negative . S/p antibiotic 7 days of Ceftriaxone and Linezolid. On 4 L supplemental oxygen via [place] this am.    #Controlled T2DM Chronic. Hemoglobin A1c 6.6 on admission, Prior history of prediabetes. Will discontinue  all scheduled insulin and keep SSI as patient is off tube feeds. CBG 148 this am.   - Continue SSI - CTM   #HTN Chronic. Home medications are amlodipine 5 mg daily, carvedilol 6.25 mg BID, and Valsartan 240 mg daily. Normotensive on amlodipine 10 mgm Carvedilol 25 mg BID. Kidney function wnl, and I will restart valsartan at discharge given above goal BP readings and nepro-protective effect in setting of T2DM.  -Continue meds as above.  -CTM his blood pressure  #MDD Chronic. Continue home Zoloft. Clonazepam and  Quetiapine stopped as they were added on in an acute setting that has resolved.  -Continue home zoloft -CTM   Diet: Regular IVF: None VTE: Lovenox Code: Full PT/OT recs: HH PT/OT Prior to Admission Living Arrangement: Home Anticipated Discharge Location: Home Barriers to Discharge: Medical Stability Dispo: Anticipated discharge today  Idamae Schuller, MD Tillie Rung. [facility] Internal Medicine Residency, PGY-1  Pager: [phone] After 5 pm and on weekends: Please call the on-call pager

## 2021-07-31 NOTE — Progress Notes (Signed)
During leader rounds - patient asked to be discharged home rather than going to CIR - patient stated he had plans this weekend and wanted to spend time with his family - requested that he go home with home health, stated he already has a rollator and shouldn't need any further equipment. Information passed on to primary RN.

## 2021-07-31 NOTE — Discharge Summary (Addendum)
Name: RUTLEDGE SELSOR MRN: [id number] DOB: 08-24-1955 66 y.o. PCP: [facility] [place]  Date of Admission: 07/16/2021  7:44 AM Date of Discharge: 08/01/22 Attending Physician: Lottie Mussel MD  Discharge Diagnosis: Active Problems:   Hypertension   ARDS (adult respiratory distress syndrome) ([place])   Type 2 diabetes mellitus with hyperglycemia ([place])   Community acquired pneumonia   Weakness acquired in ICU Class I obesity Tobacco use disorder  Discharge Medications: Allergies as of 07/31/2021   No Known Allergies      Medication List     STOP taking these medications    docusate sodium 50 MG capsule Commonly known as: COLACE   hydrochlorothiazide 25 MG tablet Commonly known as: HYDRODIURIL   HYDROcodone-acetaminophen 5-325 MG tablet Commonly known as: NORCO/VICODIN   nortriptyline 10 MG capsule Commonly known as: PAMELOR   pantoprazole 20 MG tablet Commonly known as: PROTONIX       TAKE these medications    acetaminophen 325 MG tablet Commonly known as: TYLENOL Take 650 mg by mouth every 4 (four) hours as needed for mild pain or fever.   amLODipine 10 MG tablet Commonly known as: NORVASC Take 1 tablet (10 mg total) by mouth daily.   aspirin EC 81 MG tablet Take 81 mg by mouth daily.   atorvastatin 40 MG tablet Commonly known as: LIPITOR Take 1 tablet (40 mg total) by mouth daily. What changed:  medication strength how much to take   carvedilol 25 MG tablet Commonly known as: COREG Take 1 tablet (25 mg total) by mouth 2 (two) times daily with a meal. What changed:  medication strength how much to take   omeprazole 20 MG capsule Commonly known as: PRILOSEC Take 1 capsule (20 mg total) by mouth daily.   pregabalin 200 MG capsule Commonly known as: LYRICA Take 1 capsule (200 mg total) by mouth 2 (two) times daily.   sertraline 50 MG tablet Commonly known as: ZOLOFT Take 3 tablets (150 mg total) by mouth daily. What changed:  medication strength   tamsulosin 0.4 MG Caps capsule Commonly known as: FLOMAX Take 1 capsule (0.4 mg total) by mouth at bedtime.   valsartan 80 MG tablet Commonly known as: DIOVAN Take 1 tablet (80 mg total) by mouth in the morning, at noon, and at bedtime.        Disposition and follow-up:   Mr.Kobyn W Chevere was discharged from [facility] in Stable condition.  At the hospital follow up visit please address:  Community acquired pneumonia: ensure patient's respiratory status at baseline. Encourage smoking cessation.  Weakness 2/2 to ICU: Patient refused IPR and was discharged with home health PT/OT. Ensure his weakness improved.  MDD: Ensure patient's mood is well as he was acutely sick and had significant weakness which can exacerbate MDD.   2.  Labs / imaging needed at time of follow-up: CBC, BMP  3.  Pending labs/ test needing follow-up: None  Follow-up Appointments:  Follow-up [place]. Call today.   Specialty: General Practice Why: Make a hospital follow up in 7-10 days. Contact information: [place] [address] [phone]                 Hospital Course by problem list: HPI per Dr. Charleen Kirks Mr. Karell Tukes is a 66 y/o male with a PMHx of HTN, aortic valve stenosis, NVST/PSVT w/ loop recorder placed, prostate carcinoma, BPH, PTSD, MDD who presents to the ED  with c/o shortness of breathe.   Mr. Ricardo states that on Sunday, May 7, he developed rapidly progressive shortness of breath that was unrelieved by any particular position.  He developed a cough that was productive but he is unable to describe his sputum.  He endorses feeling hot and cold, but was unable to check his temperature.  Additional symptoms that he was experiencing at the time include polyarthralgia, abdominal bloating, and chest pain with cough.  He denies any rhinorrhea, congestion, nausea, vomiting, diarrhea, extremity  swelling, palpitations.     Mr. Kalmbach denies any prior history of pulmonary disease, but notes he has been smoking for "his entire life."  He denies formal diagnosis of COPD or any prior episodes of pneumonia.  In addition, he used cocaine via inhalation the day before symptom onset; he denies any prior breathing difficulties when using cocaine.   #Acute hypoxic respiratory failure 2/2 culture negative community acquire pneumonia c/b ARDS #Weakness acquired in ICU He was satting at 5s in the ED. Was febrile and had leukocytosis. CXR showed multifocal pneumonia. Was started on ceftriaxone, azithromycin and Bipap. He was admitted to the ICU on 05/10 for worsening oxygen requirement and being a full code. Later he was intubated in the ICU on 05/12. MRSA swab positive. Patient started on linezolid. He was given IV solumedrol 60 mg BID. Imaging suggestive of ARDS. He was given lasix. Respiratory status improved and patient extubated on 05/20. He was satting well on 4 L [facility]. He was transferred out of the ICU on 05/22. Steroid taper ended on 05/23. His strep pneumo and legionella urinary antigen was negative. He completed 7 days of ceftriaxone and linezolid. He improved to room air. PT/OT recommend IPR but patient refused but improved to be discharged home with home health PT/OT.   #Controlled T2DM Chronic. Hemoglobin A1c 6.6 on admission, Prior history of prediabetes. Was placed on mealtime and long acting insulin as he was receiving tube feeds in the ICU but those were discontinued and patient was kept on SSI.CBG 148 this am on day of discharge.    #HTN Chronic. Home medications are amlodipine 5 mg daily, carvedilol 6.25 mg BID, and Valsartan 240 mg daily. Normotensive on amlodipine 10 mgm Carvedilol 25 mg BID. Kidney function wnl, and home valsartan was restarted at discharge given above goal BP readings and nepro-protective effect in setting of T2DM.   #MDD Chronic. Home Zoloft was continued.  Clonazepam and Quetiapine were added in the ICU but stopped as after patient was transitioned out of the ICU.    Discharge Subjective: Pt is seen at bedside during rounds today. He reports feeling well but is not pleased to be in the hospital setting d/t the upcoming holiday. Breakfast is at bedside, and he complains that it is cold. He is persistent about going home today to spend time with his family at home. He understands that if he goes home, he would be unable to go to CIR. He agrees to work with PT today for further eval.  Discharge Exam:   BP (!) 144/90   Pulse 78   Temp 97.8 F (36.6 C) (Oral)   Resp 18   Ht '5\' 6"'$  (1.676 m)   Wt 83.4 kg   SpO2 97%   BMI 29.68 kg/m  Physical Exam  General: NAD, sitting on side of the bed on exam HENT: NCAT Lungs:  CTAB  Cardiovascular: NS rate and rhythm, 2+ radial pulse GI: No TTP MSK: no asymmetry  Skin: no  lesions on exposed skin Neuro: alert and oriented x4. 5/5 strength in BUE, 5/5 in BLE.  Psych: normal mood and normal affect  Pertinent Labs, Studies, and Procedures:     Latest Ref Rng & Units 07/31/2021   12:40 AM 07/30/2021    6:01 AM 07/26/2021    5:26 AM  CBC  WBC 4.0 - 10.5 K/uL 18.3   17.1   17.1    Hemoglobin 13.0 - 17.0 g/dL 11.6   12.0   11.6    Hematocrit 39.0 - 52.0 % 37.3   37.6   37.7    Platelets 150 - 400 K/uL 326   342   257         Latest Ref Rng & Units 07/31/2021   12:40 AM 07/30/2021    6:01 AM 07/29/2021    9:33 AM  CMP  Glucose 70 - 99 mg/dL 145   152   177    BUN 8 - 23 mg/dL '20   24   29    '$ Creatinine 0.61 - 1.24 mg/dL 0.95   0.94   0.99    Sodium 135 - 145 mmol/L 139   139   143    Potassium 3.5 - 5.1 mmol/L 3.8   3.8   4.2    Chloride 98 - 111 mmol/L 109   105   109    CO2 22 - 32 mmol/L '21   26   26    '$ Calcium 8.9 - 10.3 mg/dL 8.7   8.8   9.1      CT Angio Chest Pulmonary Embolism (PE) W or WO Contrast  Result Date: 07/16/2021 CLINICAL DATA:  Rule out pulmonary embolism. Shortness of breath and  cough for 2 days. Abdominal distension. EXAM: CT ANGIOGRAPHY CHEST CT ABDOMEN AND PELVIS WITH CONTRAST TECHNIQUE IMPRESSION: 1. Exam detail is diminished secondary to respiratory motion artifact. Within these limitations there are no signs of lobar or segmental pulmonary artery filling defects to suggest a clinically significant acute pulmonary embolus. 2. Extensive ground-glass and airspace opacities throughout both lungs compatible with multifocal pneumonia. Atypical infection is not excluded. 3. No acute findings within the abdomen or pelvis. 4. Left-sided colonic diverticulosis without signs of acute diverticulitis. 5. Aortic Atherosclerosis (ICD10-I70.0). Electronically Signed   By: Kerby Moors M.D.   On: 07/16/2021 10:27   CT ABDOMEN PELVIS W CONTRAST  Result Date: 07/16/2021 CLINICAL DATA:  Rule out pulmonary embolism. Shortness of breath and cough for 2 days. Abdominal distension. EXAM: CT ANGIOGRAPHY CHEST CT ABDOMEN AND PELVIS WITH CONTRAST TECHNIQUE: IMPRESSION: 1. Exam detail is diminished secondary to respiratory motion artifact. Within these limitations there are no signs of lobar or segmental pulmonary artery filling defects to suggest a clinically significant acute pulmonary embolus. 2. Extensive ground-glass and airspace opacities throughout both lungs compatible with multifocal pneumonia. Atypical infection is not excluded. 3. No acute findings within the abdomen or pelvis. 4. Left-sided colonic diverticulosis without signs of acute diverticulitis. 5. Aortic Atherosclerosis (ICD10-I70.0). Electronically Signed   By: Kerby Moors M.D.   On: 07/16/2021 10:27   DG Chest Port 1 View  Result Date: 07/16/2021 CLINICAL DATA:  Shortness of breath and productive cough for 2 days. EXAM: PORTABLE CHEST 1 VIEW  IMPRESSION: New bilateral lung opacities concerning for pneumonia or edema. Electronically Signed   By: Logan Bores M.D.   On: 07/16/2021 08:22   ECHOCARDIOGRAM COMPLETE  Result Date:  07/17/2021    ECHOCARDIOGRAM REPORT   Patient Name:  [place] Date of Exam: 07/17/2021 IMPRESSIONS  1. Left ventricular ejection fraction, by estimation, is 55 to 60%. The left ventricle has normal function. The left ventricle has no regional wall motion abnormalities. There is mild concentric left ventricular hypertrophy. Left ventricular diastolic parameters are consistent with Grade I diastolic dysfunction (impaired relaxation).  2. Right ventricular systolic function is normal. The right ventricular size is normal.  3. The mitral valve is normal in structure. Trivial mitral valve regurgitation.  4. The aortic valve is tricuspid. There is There is severe focal calcification of the [place] with severely restricted leaflet motion. There is moderate thickening of the aortic valve. Aortic valve regurgitation is trivial. Moderate aortic valve stenosis. Aortic valve area, by VTI measures 0.92 cm. Aortic valve mean gradient measures 27.0 mmHg. Aortic valve Vmax measures 3.32 m/s. DI 0.3.  5. The inferior vena cava is dilated in size with <50% respiratory variability, suggesting right atrial pressure of 15 mmHg.   Discharge Instructions: Discharge Instructions     Call MD for:  difficulty breathing, headache or visual disturbances   Complete by: As directed    Call MD for:  extreme fatigue   Complete by: As directed    Call MD for:  hives   Complete by: As directed    Call MD for:  persistant dizziness or light-headedness   Complete by: As directed    Call MD for:  persistant nausea and vomiting   Complete by: As directed    Call MD for:  redness, tenderness, or signs of infection (pain, swelling, redness, odor or green/yellow discharge around incision site)   Complete by: As directed    Call MD for:  severe uncontrolled pain   Complete by: As directed    Call MD for:  temperature >100.4   Complete by: As directed    Diet - low sodium heart healthy   Complete by: As directed    Increase  activity slowly   Complete by: As directed       Discharge Instructions: Mr. Mckesson,   It was a pleasure taking care of you! You came to the ED with shortness of breath and found to have pneumonia. Your breathing got so bad that it required intubation and an ICU stay. You completed the antibiotics and the steroids and have made significant progress. We are discharging you with your home medicines that have been sent to your requested pharmacy. We are changing some of your medicines and they are listed below. Initially physical therapy recommended inpatient rehab but you stated you do not want to stay in the hospital. PT re-evaluated and stated you are stable to go with close family support and home PT and OT. I wish you the best in your road to recovery!  Medicine to change:  Increase Coreg to 25 mg twice daily Increase lipitor to 40 mg daily Increase Zoloft to 150 mg daily  Stop taking these medications: HCTZ 25 mg daily Nortriptyline 10 20 mg daily Vicodin  Sincerely,  Idamae Schuller, MD  Signed: Idamae Schuller, MD Tillie Rung. [facility] Internal Medicine Residency, PGY-1  08/01/2021, 10:36 PM   Pager: [phone]

## 2021-07-31 NOTE — Progress Notes (Signed)
Inpatient Rehab Admissions Coordinator:   Spoke to wife over the phone.  Note therapy recommendations updated to home health.  Spouse states she is home during day and able to provide assist as needed.  Pt also with supportive children.  Will withdraw insurance request for CIR and let TOC know to f/u for therapy/DME needs.    Shann Medal, PT, DPT Admissions Coordinator [phone] 07/31/21  10:52 AM

## 2021-07-31 NOTE — Progress Notes (Signed)
Physical Therapy Treatment Patient Details Name: Zachary Briggs MRN: [id number] DOB: 09/03/1955 Today's Date: 07/31/2021   History of Present Illness Patient is a 66 y/o male who presents on 5/9 with cough and SOB, with concern for ARDS, intubated 5/12-5/20. Admitted with acute respiratory failure secondary to PNA, toxic metabolic encephalopathy in the setting of critical illness, delirium and anxiety. PMH includes Hep C, HTN, prostate ca, tobacco abuse.    PT Comments    Patient progressing well towards PT goals. Pt alert and awake sitting on EOB upon PT arrival eager to work with [place] so he can go home. Session focused on gait training, transfer training and safety. Tolerated gait training with Min guard assist and use of RW for support. Noted to have 2/4 DOE with Sp02 >90% on RA. Has support from daughter and daughter in law at d/c per report. Will need RW, however wants a script for it to get it from the [place]. CM made aware. Discussed importance of safe mobility at home to decrease fall risk. Discharge recommendation updated to home with HHPT and supervision from family due to improvement in progress as well as pt refusing AIR and wanting to return home. Will follow.    Recommendations for follow up therapy are one component of a multi-disciplinary discharge planning process, led by the attending physician.  Recommendations may be updated based on patient status, additional functional criteria and insurance authorization.  Follow Up Recommendations  Home health PT     Assistance Recommended at Discharge Frequent or constant Supervision/Assistance  Patient can return home with the following A little help with walking and/or transfers;A little help with bathing/dressing/bathroom;Assistance with cooking/housework;Assist for transportation;Help with stairs or ramp for entrance   Equipment Recommendations  Rolling walker (2 wheels) (wants a script for it, getting it through [place])     Recommendations for Other Services       Precautions / Restrictions Precautions Precautions: Fall;Other (comment) Precaution Comments: watch 02 Restrictions Weight Bearing Restrictions: No     Mobility  Bed Mobility               General bed mobility comments: Sitting EOB upon PT arrival.    Transfers Overall transfer level: Needs assistance Equipment used: Rolling walker (2 wheels) Transfers: Sit to/from Stand Sit to Stand: Min guard, Min assist           General transfer comment: Min guard for safety. Cues for hand placement/technique, Stood from EOB x1, from toilet x1, transferred to chair post ambulation.    Ambulation/Gait Ambulation/Gait assistance: Min guard Gait Distance (Feet): 60 Feet Assistive device: Rolling walker (2 wheels) Gait Pattern/deviations: Decreased stride length, Step-through pattern, Trunk flexed Gait velocity: decreased Gait velocity interpretation: <1.31 ft/sec, indicative of household ambulator   General Gait Details: Slow, mildly unsteady gait with flexed trunk and cues for RW proximity/management esp with turns. 2/4 DOE. Sp02 >90% on RA.   Stairs             Wheelchair Mobility    Modified Rankin (Stroke Patients Only)       Balance Overall balance assessment: Needs assistance Sitting-balance support: Feet supported, No upper extremity supported Sitting balance-Leahy Scale: Good Sitting balance - Comments: Able to assist with donning pants sitting EOB with some assist, leaning outside BoS.   Standing balance support: During functional activity Standing balance-Leahy Scale: Fair Standing balance comment: Able to stand needing at least 1 UE support, needs BUE support for walking.  Cognition Arousal/Alertness: Awake/alert Behavior During Therapy: WFL for tasks assessed/performed Overall Cognitive Status: No family/caregiver present to determine baseline cognitive functioning                                  General Comments: appears WFL, stating date/month, that he got caught from staff trying to leave unit to go outside; appropriate questions regarding d/c planning/insurance etc. Seems appropriate.        Exercises      General Comments General comments (skin integrity, edema, etc.): Sp02 remained >90% on RA during activity.      Pertinent Vitals/Pain Pain Assessment Pain Assessment: No/denies pain    Home Living                          Prior Function            PT Goals (current goals can now be found in the care plan section) Progress towards PT goals: Progressing toward goals    Frequency    Min 3X/week      PT Plan Discharge plan needs to be updated    Co-evaluation PT/OT/SLP Co-Evaluation/Treatment: Yes Reason for Co-Treatment: For patient/therapist safety;To address functional/ADL transfers (for imminent d/c and needing max A of 2 last session) PT goals addressed during session: Mobility/safety with mobility;Proper use of DME;Strengthening/ROM;Balance        AM-PAC PT "6 Clicks" Mobility   Outcome Measure  Help needed turning from your back to your side while in a flat bed without using bedrails?: A Little Help needed moving from lying on your back to sitting on the side of a flat bed without using bedrails?: A Little Help needed moving to and from a bed to a chair (including a wheelchair)?: A Little Help needed standing up from a chair using your arms (e.g., wheelchair or bedside chair)?: A Little Help needed to walk in hospital room?: A Little Help needed climbing 3-5 steps with a railing? : A Lot 6 Click Score: 17    End of Session Equipment Utilized During Treatment: Gait belt Activity Tolerance: Patient tolerated treatment well Patient left: in chair;with call bell/phone within reach;with chair alarm set Nurse Communication: Mobility status PT Visit Diagnosis: Muscle weakness (generalized)  (M62.81);Difficulty in walking, not elsewhere classified (R26.2);Unsteadiness on feet (R26.81)     Time: 5366-4403 PT Time Calculation (min) (ACUTE ONLY): 24 min  Charges:  $Gait Training: 8-22 mins                     Marisa Severin, PT, DPT Acute Rehabilitation Services Secure chat preferred Office [place] 07/31/2021, 9:01 AM

## 2021-07-31 NOTE — Discharge Instructions (Addendum)
Mr. Tucholski,   It was a pleasure taking care of you! You came to the ED with shortness of breath and found to have pneumonia. Your breathing got so bad that it required intubation and an ICU stay. You completed the antibiotics and the steroids and have made significant progress. We are discharging you with your home medicines that have been sent to your requested pharmacy. We are changing some of your medicines and they are listed below. Initially physical therapy recommended inpatient rehab but you stated you do not want to stay in the hospital. PT re-evaluated and stated you are stable to go with close family support and home PT and OT. I wish you the best in your road to recovery!  Medicine to change:  Increase Coreg to 25 mg twice daily Increase lipitor to 40 mg daily Increase Zoloft to 150 mg daily  Stop taking these medications: HCTZ 25 mg daily Nortriptyline 10 20 mg daily Vicodin  Sincerely,  Idamae Schuller, MD

## 2021-08-05 ENCOUNTER — Encounter (HOSPITAL_COMMUNITY): Payer: Self-pay | Admitting: *Deleted

## 2021-08-05 ENCOUNTER — Ambulatory Visit (HOSPITAL_COMMUNITY)
Admission: EM | Admit: 2021-08-05 | Discharge: 2021-08-05 | Discharge status: Against Medical Advice | Payer: Medicare Other | Attending: Sports Medicine | Admitting: Sports Medicine

## 2021-08-05 DIAGNOSIS — M79602 Pain in left arm: Secondary | ICD-10-CM

## 2021-08-05 DIAGNOSIS — R29898 Other symptoms and signs involving the musculoskeletal system: Secondary | ICD-10-CM | POA: Diagnosis not present

## 2021-08-05 DIAGNOSIS — R5381 Other malaise: Secondary | ICD-10-CM

## 2021-08-05 HISTORY — DX: Hypokalemia: E87.6

## 2021-08-05 HISTORY — DX: Pneumonia, unspecified organism: J18.9

## 2021-08-05 MED ORDER — KETOROLAC TROMETHAMINE 30 MG/ML IJ SOLN
30.0000 mg | Freq: Once | INTRAMUSCULAR | Status: AC
  Administered 2021-08-05: 30 mg via INTRAMUSCULAR

## 2021-08-05 MED ORDER — KETOROLAC TROMETHAMINE 30 MG/ML IJ SOLN
INTRAMUSCULAR | Status: AC
  Filled 2021-08-05: qty 1

## 2021-08-05 NOTE — ED Triage Notes (Addendum)
Pt encouraged to sit in armed chair in exam room; pt declined stating he will sit on exam table.  Pt reports being discharged from hospital 07/31/21; states since discharge has had LUE pain from left lateral neck all the way down into left hand and fingers. C/O weakness in LUE. Pt has markedly weaker left hand grasp than right side. Denies any leg weakness. Pt's wife states pt's speech has also been different since discharge 5 days ago.

## 2021-08-05 NOTE — ED Provider Notes (Signed)
[facility]    CSN: [id number] Arrival date & time: 08/05/21  0900      History   Chief Complaint Chief Complaint  Patient presents with   Arm Pain   Extremity Weakness    HPI Zachary Briggs is a 66 y.o. male here for LUE weakness and pain.   Arm Pain Pertinent negatives include no chest pain, no headaches and no shortness of breath.  Extremity Weakness Pertinent negatives include no chest pain, no headaches and no shortness of breath.   Patient presents with his wife for left upper extremity weakness.  I did do an extensive chart review on the patient and it does appear that on 07/31/2021 he was discharged from the hospital after a quite extensive stay in the ICU with acute hypoxic respiratory failure secondary to pneumonia and concomitant ARDS.  He did have a rather lengthy stay being admitted to the ICU on 07/17/2021 and not discharged on the 24th.  In the note he does state that he had some chronic weakness given his prolonged stay but did not have any asymmetric weakness per the patient.  Patient states that the day following his discharge he had an onset of left upper extremity weakness.  He states he is not able to lift the hand up or grip his fingers.  He states the day before in the hospital he was using a hand completely normal.  This will be about day 4-5 of his symptoms.  He denies any prior history of stroke.  He denies any numbness tingling, does have gross weakness.  He reports no history of CAD or stroke.  He does have a history of PSVT with a loop recorder that is placed and followed by cardiology.   He tells me today that he simply would want something for pain as he has not been able to sleep over the next couple days.  He does have an appointment at the [facility] tomorrow 08/06/2021.  Past Medical History:  Diagnosis Date   Aortic atherosclerosis ([place]) 12/29/2015   Aortic stenosis 12/30/2015   AVA around 1.3-1.4 cm2, trivial MR, normal LA   size, normal IVC.    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/2015   Heart murmur    Heart valve disease    Hepatitis C    Hypertension    Hypokalemia    Pneumonia    Prediabetes 12/29/2015   Prostate cancer ([place])    Tobacco abuse     Patient Active Problem List   Diagnosis Date Noted   Weakness acquired in ICU 07/29/2021   Acute encephalopathy    Community acquired pneumonia    Hypernatremia 07/21/2021   ARDS (adult respiratory distress syndrome) ([place]) 07/20/2021   Acute on chronic respiratory failure with hypoxia and hypercapnia ([place]) 07/20/2021   Obesity (BMI 30-39.9) 07/20/2021   Type 2 diabetes mellitus with hyperglycemia ([place]) 07/20/2021   Aortic stenosis 12/30/2015   Aortic atherosclerosis ([place]) 12/29/2015   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 Authorizing Provider  acetaminophen (TYLENOL) 325 MG tablet Take 650 mg by mouth every 4 (four) hours as needed for mild pain or fever.  [provider]  amLODipine (NORVASC) 10 MG tablet Take 1 tablet (10 mg total) by mouth daily. 08/01/21 08/31/21  Idamae Schuller, MD  aspirin EC 81 MG tablet Take 81 mg by mouth daily. Patient not taking: Reported on 07/16/2021    [provider]  atorvastatin (LIPITOR) 40 MG tablet Take 1 tablet (40 mg total) by mouth daily. 08/01/21 08/31/21  Idamae Schuller, MD  carvedilol (COREG) 25 MG tablet Take 1 tablet (25 mg total) by mouth 2 (two) times daily with a meal. 07/31/21 08/30/21  Idamae Schuller, MD  omeprazole (PRILOSEC) 20 MG capsule Take 1 capsule (20 mg total) by mouth daily. 07/31/21 08/30/21  Idamae Schuller, MD  pregabalin (LYRICA) 200 MG capsule Take 1 capsule (200 mg total) by mouth 2 (two) times daily. 07/31/21 08/30/21  Idamae Schuller, MD  sertraline  (ZOLOFT) 50 MG tablet Take 3 tablets (150 mg total) by mouth daily. 08/01/21 08/31/21  Idamae Schuller, MD  tamsulosin (FLOMAX) 0.4 MG CAPS capsule Take 1 capsule (0.4 mg total) by mouth at bedtime. 07/31/21 08/30/21  Idamae Schuller, MD  valsartan (DIOVAN) 80 MG tablet Take 1 tablet (80 mg total) by mouth in the morning, at noon, and at bedtime. 07/31/21 08/30/21  Idamae Schuller, MD    Family History Family History  Problem Relation Age of Onset   Cancer Mother    Cancer Father    Cancer Sister    Diabetes Mellitus II Brother     Social History Social History   Tobacco Use   Smoking status: Every Day    Types: Cigars   Smokeless tobacco: Never  Vaping Use   Vaping Use: Never used  Substance Use Topics   Alcohol use: Yes    Alcohol/week: 4.0 standard drinks    Types: 4 Cans of beer per week   Drug use: Yes    Types: Marijuana     Allergies   Patient has no known allergies.   Review of Systems Review of Systems  Respiratory:  Negative for shortness of breath and wheezing.   Cardiovascular:  Negative for chest pain.  Musculoskeletal:  Positive for extremity weakness. Negative for gait problem.  Neurological:  Positive for weakness (LUE weakness). Negative for dizziness, light-headedness and headaches.    Physical Exam Triage Vital Signs ED Triage Vitals [08/05/21 0945]  Enc Vitals Group     BP 140/89     Pulse Rate 80     Resp 20     Temp      Temp src      SpO2 92 %     Weight      Height      Head Circumference      Peak Flow      Pain Score      Pain Loc      Pain Edu?      Excl. in [place]?    No data found.  Updated Vital Signs BP 140/89   Pulse 80   Resp 20   SpO2 92%   Physical Exam Gen: Well-appearing, in no acute distress; non-toxic CV: Regular Rate. Well-perfused. Warm.  Resp: Breathing unlabored on room air; no wheezing. Psych: Fluid speech in conversation; appropriate affect; normal thought process Neuro: Sensation intact throughout. No gross  coordination deficits.  MSK:  - Left UE: Skin is intact, warmth, cap refill less than 2 seconds.  Patient is able to stimulate finger contraction of the is not able to actively move the left upper extremity.  Strength  1-5 throughout  - Right UE: Has complete full range of motion and full strength of the right upper extremity   UC Treatments / Results  Labs (all labs ordered are listed, but only abnormal results are displayed) Labs Reviewed - No data to display  EKG   Radiology No results found.  Procedures Procedures (including critical care time)  Medications Ordered in UC Medications  ketorolac (TORADOL) 30 MG/ML injection 30 mg (30 mg Intramuscular Given 08/05/21 1001)    Initial Impression / Assessment and Plan / UC Course  I have reviewed the triage vital signs and the nursing notes.  Pertinent labs & imaging results that were available during my care of the patient were reviewed by me and considered in my medical decision making (see chart for details).     Patient presents with about 4 days of left upper extremity pain and weakness.  He has been unable to move the arm for the last 4 days or so.  He did have a quite extensive stay in the ICU from 07/17/2021 until 07/31/2021.  During his stay did note that he had quite extensive physical deconditioning, although per the patient he was able to use the left upper extremity until the day following his hospital discharge.  I discussed the high priority of his newer onset left upper extremity weakness, that could be consistent with a stroke, vascular disorder or clot.  I did highly stressed to the patient and his wife that he go seek care in the emergency room for further evaluation as we are unable to perform advanced scans here in the urgent care.  The patient is adamant on not going to the emergency room.  He states he has an appointment at the [facility] tomorrow with his physician.  He would like something for pain, I did give him an IM  injection of Toradol 30 mg per mL.  I did highly stressed that he should go to the emergency room for further evaluation.  The patient then ended up leaving AMA.   Final Clinical Impressions(s) / UC Diagnoses   Final diagnoses:  Weakness of left upper extremity  Physical deconditioning  Left arm pain   Discharge Instructions   None    ED Prescriptions   None    PDMP not reviewed this encounter.   Elba Barman, DO 08/05/21 1024

## 2021-08-08 ENCOUNTER — Emergency Department (HOSPITAL_COMMUNITY): Payer: Medicare Other

## 2021-08-08 ENCOUNTER — Emergency Department (HOSPITAL_COMMUNITY)
Admission: EM | Admit: 2021-08-08 | Discharge: 2021-08-08 | Disposition: A | Discharge status: Home / Self Care | Payer: Medicare Other | Attending: Emergency Medicine | Admitting: Emergency Medicine

## 2021-08-08 ENCOUNTER — Other Ambulatory Visit: Payer: Self-pay

## 2021-08-08 ENCOUNTER — Encounter (HOSPITAL_COMMUNITY): Payer: Self-pay

## 2021-08-08 DIAGNOSIS — R0989 Other specified symptoms and signs involving the circulatory and respiratory systems: Secondary | ICD-10-CM | POA: Insufficient documentation

## 2021-08-08 DIAGNOSIS — M47812 Spondylosis without myelopathy or radiculopathy, cervical region: Secondary | ICD-10-CM

## 2021-08-08 DIAGNOSIS — R479 Unspecified speech disturbances: Secondary | ICD-10-CM

## 2021-08-08 DIAGNOSIS — D649 Anemia, unspecified: Secondary | ICD-10-CM | POA: Diagnosis not present

## 2021-08-08 DIAGNOSIS — R1310 Dysphagia, unspecified: Secondary | ICD-10-CM | POA: Diagnosis not present

## 2021-08-08 DIAGNOSIS — M79602 Pain in left arm: Secondary | ICD-10-CM | POA: Diagnosis not present

## 2021-08-08 DIAGNOSIS — R0602 Shortness of breath: Secondary | ICD-10-CM | POA: Diagnosis not present

## 2021-08-08 DIAGNOSIS — M542 Cervicalgia: Secondary | ICD-10-CM | POA: Diagnosis not present

## 2021-08-08 DIAGNOSIS — R739 Hyperglycemia, unspecified: Secondary | ICD-10-CM | POA: Diagnosis not present

## 2021-08-08 DIAGNOSIS — R079 Chest pain, unspecified: Secondary | ICD-10-CM | POA: Insufficient documentation

## 2021-08-08 LAB — COMPREHENSIVE METABOLIC PANEL
ALT: 49 U/L — ABNORMAL HIGH (ref 0–44)
AST: 24 U/L (ref 15–41)
Albumin: 3.3 g/dL — ABNORMAL LOW (ref 3.5–5.0)
Alkaline Phosphatase: 89 U/L (ref 38–126)
Anion gap: 9 (ref 5–15)
BUN: <5 mg/dL — ABNORMAL LOW (ref 8–23)
CO2: 21 mmol/L — ABNORMAL LOW (ref 22–32)
Calcium: 8.9 mg/dL (ref 8.9–10.3)
Chloride: 109 mmol/L (ref 98–111)
Creatinine, Ser: 0.8 mg/dL (ref 0.61–1.24)
GFR, Estimated: >60 mL/min (ref >60)
Glucose, Bld: 217 mg/dL — ABNORMAL HIGH (ref 70–99)
Potassium: 3.5 mmol/L (ref 3.5–5.1)
Sodium: 139 mmol/L (ref 135–145)
Total Bilirubin: 0.6 mg/dL (ref 0.3–1.2)
Total Protein: 7.1 g/dL (ref 6.5–8.1)

## 2021-08-08 LAB — DIFFERENTIAL
Abs Immature Granulocytes: 0.02 10*3/uL (ref 0.00–0.07)
Basophils Absolute: 0 10*3/uL (ref 0.0–0.1)
Basophils Relative: 0 %
Eosinophils Absolute: 0.2 10*3/uL (ref 0.0–0.5)
Eosinophils Relative: 3 %
Immature Granulocytes: 0 %
Lymphocytes Relative: 30 %
Lymphs Abs: 2.3 10*3/uL (ref 0.7–4.0)
Monocytes Absolute: 0.5 10*3/uL (ref 0.1–1.0)
Monocytes Relative: 6 %
Neutro Abs: 4.5 10*3/uL (ref 1.7–7.7)
Neutrophils Relative %: 61 %

## 2021-08-08 LAB — RAPID URINE DRUG SCREEN, HOSP PERFORMED
Amphetamines: NOT DETECTED
Barbiturates: NOT DETECTED
Benzodiazepines: NOT DETECTED
Cocaine: NOT DETECTED
Opiates: NOT DETECTED
Tetrahydrocannabinol: NOT DETECTED

## 2021-08-08 LAB — ETHANOL: Alcohol, Ethyl (B): <10 mg/dL (ref <10)

## 2021-08-08 LAB — CBC
HCT: 39.5 % (ref 39.0–52.0)
Hemoglobin: 12.9 g/dL — ABNORMAL LOW (ref 13.0–17.0)
MCH: 27.3 pg (ref 26.0–34.0)
MCHC: 32.7 g/dL (ref 30.0–36.0)
MCV: 83.5 fL (ref 80.0–100.0)
Platelets: 220 10*3/uL (ref 150–400)
RBC: 4.73 MIL/uL (ref 4.22–5.81)
RDW: 16 % — ABNORMAL HIGH (ref 11.5–15.5)
WBC: 7.5 10*3/uL (ref 4.0–10.5)
nRBC: 0 % (ref 0.0–0.2)

## 2021-08-08 LAB — PROTIME-INR
INR: 1.1 (ref 0.8–1.2)
Prothrombin Time: 13.6 seconds (ref 11.4–15.2)

## 2021-08-08 LAB — BRAIN NATRIURETIC PEPTIDE: B Natriuretic Peptide: 81.5 pg/mL (ref 0.0–100.0)

## 2021-08-08 LAB — URINALYSIS, ROUTINE W REFLEX MICROSCOPIC
Bilirubin Urine: NEGATIVE
Glucose, UA: 50 mg/dL — AB
Hgb urine dipstick: NEGATIVE
Ketones, ur: NEGATIVE mg/dL
Leukocytes,Ua: NEGATIVE
Nitrite: NEGATIVE
Protein, ur: NEGATIVE mg/dL
Specific Gravity, Urine: 1.004 — ABNORMAL LOW (ref 1.005–1.030)
pH: 7 (ref 5.0–8.0)

## 2021-08-08 LAB — TROPONIN I (HIGH SENSITIVITY)
Troponin I (High Sensitivity): 20 ng/L — ABNORMAL HIGH (ref <18)
Troponin I (High Sensitivity): 22 ng/L — ABNORMAL HIGH (ref <18)

## 2021-08-08 LAB — APTT: aPTT: 27 seconds (ref 24–36)

## 2021-08-08 LAB — CBG MONITORING, ED: Glucose-Capillary: 140 mg/dL — ABNORMAL HIGH (ref 70–99)

## 2021-08-08 MED ORDER — ETODOLAC 300 MG PO CAPS: 300.0000 mg | ORAL_CAPSULE | Freq: Three times a day (TID) | ORAL | 0 refills | Status: DC

## 2021-08-08 MED ORDER — MORPHINE SULFATE (PF) 4 MG/ML IV SOLN
4.0000 mg | Freq: Once | INTRAVENOUS | Status: AC
  Administered 2021-08-08: 4 mg via INTRAVENOUS
  Filled 2021-08-08: qty 1

## 2021-08-08 NOTE — ED Notes (Addendum)
Pt still in MRI dept at this time, unable to assess. Provided family with snack.

## 2021-08-08 NOTE — ED Notes (Signed)
Patient transported to MRI 

## 2021-08-08 NOTE — ED Notes (Signed)
Patient transported to X-ray 

## 2021-08-08 NOTE — ED Notes (Signed)
Patient transported to CT 

## 2021-08-08 NOTE — ED Notes (Signed)
Patient back from CT.

## 2021-08-08 NOTE — Discharge Instructions (Signed)
Take the medications as needed for pain.  Continue your other medications.  Follow-up with your primary doctor or consider seeing a spine doctor for further treatment

## 2021-08-08 NOTE — ED Notes (Signed)
EDP at bedside  

## 2021-08-08 NOTE — ED Triage Notes (Signed)
Pt arrived POV from home c/o neck pain and left arm pain x1 week since he left the hospital for something else. Pt has decreased strength on the left side. Pt was seen at urgent care 2 days ago for same.

## 2021-08-08 NOTE — ED Provider Notes (Signed)
[facility] EMERGENCY DEPARTMENT Provider Note   CSN: [id number] Arrival date & time: 08/08/21  0825     History  Chief Complaint  Patient presents with   Neck Pain   Arm Pain    Zachary Briggs is a 66 y.o. male who presents today for evaluation of 7 days of left upper extremity weakness, pain, and speech difficulty. He has a complicated past medical history, 8 days ago he was released from the hospital after being admitted from 5/9 until 5/24.  He was admitted for acute respiratory failure with hypoxia, pneumonia, and ARDS requiring intubation from 5/12 until 5/20.  Patient states that since 7 days ago, the day after he was discharged, he woke up with difficulty finding his words and inability to use his left arm.  He states that he went to the urgent care on 5/29.  Patient states that what finally caused him to come in was that today this morning he developed left-sided chest pain.  This pain does not radiate or move. He has had pain in his left arm since the weakness started.  He describes this as pins-and-needles and feeling like his skin is crawling.  This goes through his entire arm.  He does report pain in his neck and difficulty swallowing additionally.  He denies any fevers. He states that he was only able to walk about 5 steps due to getting very short of breath.  He has not noticed any weakness in his left leg however notes that he is not walking much.   On chart review it appears that there were no mentions of left upper extremity localized weakness that I can see, including his final PT OT notes prior to discharge. It is noted that he had global weakness from deconditioning while in the ICU.  On chart review it appears his blood cultures drawn at the time of admission did not have any growth.  His respiratory culture did show MRSA.  He completed 7 days of ceftriaxone and linezolid prior to his discharge. He was recommended for inpatient rehab however  refused and was discharged home with home health and PT/OT.   HPI     Home Medications Prior to Admission medications   Medication Sig Start Date End Date Taking? Authorizing Provider  acetaminophen (TYLENOL) 325 MG tablet Take 650 mg by mouth every 4 (four) hours as needed for mild pain or fever.    [provider]  amLODipine (NORVASC) 10 MG tablet Take 1 tablet (10 mg total) by mouth daily. 08/01/21 08/31/21  Idamae Schuller, MD  aspirin EC 81 MG tablet Take 81 mg by mouth daily. Patient not taking: Reported on 07/16/2021    [provider]  atorvastatin (LIPITOR) 40 MG tablet Take 1 tablet (40 mg total) by mouth daily. 08/01/21 08/31/21  Idamae Schuller, MD  carvedilol (COREG) 25 MG tablet Take 1 tablet (25 mg total) by mouth 2 (two) times daily with a meal. 07/31/21 08/30/21  Idamae Schuller, MD  omeprazole (PRILOSEC) 20 MG capsule Take 1 capsule (20 mg total) by mouth daily. 07/31/21 08/30/21  Idamae Schuller, MD  pregabalin (LYRICA) 200 MG capsule Take 1 capsule (200 mg total) by mouth 2 (two) times daily. 07/31/21 08/30/21  Idamae Schuller, MD  sertraline (ZOLOFT) 50 MG tablet Take 3 tablets (150 mg total) by mouth daily. 08/01/21 08/31/21  Idamae Schuller, MD  tamsulosin (FLOMAX) 0.4 MG CAPS capsule Take 1 capsule (0.4 mg total) by mouth at bedtime. 07/31/21 08/30/21  Idamae Schuller, MD  valsartan (DIOVAN) 80 MG tablet Take 1 tablet (80 mg total) by mouth in the morning, at noon, and at bedtime. 07/31/21 08/30/21  Idamae Schuller, MD      Allergies    Patient has no known allergies.    Review of Systems   Review of Systems See above Physical Exam Updated Vital Signs BP (!) 151/103   Pulse 94   Temp 98.5 F (36.9 C) (Oral)   Resp 16   Ht '5\' 5"'$  (1.651 m)   Wt 83.9 kg   SpO2 93%   BMI 30.79 kg/m  Physical Exam Vitals and nursing note reviewed.  Constitutional:      General: He is not in acute distress.    Appearance: Normal appearance. He is not ill-appearing.  HENT:     Head:  Normocephalic and atraumatic.     Mouth/Throat:     Mouth: Mucous membranes are moist.  Eyes:     Conjunctiva/sclera: Conjunctivae normal.  Cardiovascular:     Rate and Rhythm: Normal rate and regular rhythm.     Pulses: Normal pulses.     Heart sounds: Normal heart sounds.     Comments: 2+ bilateral radial, DP/PT pulses Pulmonary:     Effort: Pulmonary effort is normal. No respiratory distress.     Breath sounds: Rhonchi (Diffuse, bilateral) present.  Abdominal:     Tenderness: There is no abdominal tenderness. There is no guarding.  Musculoskeletal:     Cervical back: Normal range of motion and neck supple. No rigidity.     Right lower leg: No edema.     Left lower leg: No edema.  Skin:    General: Skin is warm and dry.  Neurological:     Mental Status: He is alert.     Comments: Patient is awake and alert.  He intermittently has difficulty with word finding and stuttering.  Speech is slightly slurred. Mental Status:  Alert, oriented, thought content appropriate, able to give a coherent history. Speech fluent without evidence of aphasia. Able to follow 2 step commands without difficulty.  Cranial Nerves:  II:  Peripheral visual fields grossly normal, pupils equal, round, reactive to light III,IV, VI: ptosis not present, extra-ocular motions intact bilaterally  V,VII: smile symmetric, facial light touch sensation subjectively decreased through V2 and V3 distributions on the left side, otherwise normal VIII: hearing grossly normal to voice  X: uvula elevates symmetrically  XI: bilateral shoulder shrug symmetric and strong XII: midline tongue extension without fassiculations Motor: Right upper extremity 5/5 strength. Left upper extremity 1/5 strength through grip strength, flexion and and extension at the wrist, elbow.  He is unable to hold his left arm up enough to test for pronator drift. No pronator drift right upper extremity. Patient has 5/5 strength right lower  extremity. Left lower extremity has 4/5 strength diffusely. Cerebellar: Unable to test left upper extremity finger-nose-finger due to weakness CV: distal pulses palpable throughout    Psychiatric:        Mood and Affect: Mood normal.    ED Results / Procedures / Treatments   Labs (all labs ordered are listed, but only abnormal results are displayed) Labs Reviewed  CBC - Abnormal; Notable for the following components:      Result Value   Hemoglobin 12.9 (*)    RDW 16.0 (*)    All other components within normal limits  COMPREHENSIVE METABOLIC PANEL - Abnormal; Notable for the following components:   CO2 21 (*)  Glucose, Bld 217 (*)    BUN <5 (*)    Albumin 3.3 (*)    ALT 49 (*)    All other components within normal limits  URINALYSIS, ROUTINE W REFLEX MICROSCOPIC - Abnormal; Notable for the following components:   Color, Urine STRAW (*)    Specific Gravity, Urine 1.004 (*)    Glucose, UA 50 (*)    All other components within normal limits  CBG MONITORING, ED - Abnormal; Notable for the following components:   Glucose-Capillary 140 (*)    All other components within normal limits  TROPONIN I (HIGH SENSITIVITY) - Abnormal; Notable for the following components:   Troponin I (High Sensitivity) 20 (*)    All other components within normal limits  TROPONIN I (HIGH SENSITIVITY) - Abnormal; Notable for the following components:   Troponin I (High Sensitivity) 22 (*)    All other components within normal limits  ETHANOL  PROTIME-INR  APTT  DIFFERENTIAL  RAPID URINE DRUG SCREEN, HOSP PERFORMED  BRAIN NATRIURETIC PEPTIDE    EKG None  Radiology DG Chest 2 View  Result Date: 08/08/2021 CLINICAL DATA:  66 year old male with history of pneumonia. Weakness. EXAM: CHEST - 2 VIEW COMPARISON:  Chest x-ray 07/25/2021. FINDINGS: Lung volumes are slightly low. Ill-defined opacities and areas of interstitial prominence are noted in the mid to lower lungs bilaterally. Aeration has  improved slightly compared to the prior examination. No pleural effusions. No pneumothorax. No evidence of pulmonary edema. Heart size is mildly enlarged. Upper mediastinal contours are within normal limits. Small electronic device in the subcutaneous fat of the left anterior chest wall, presumably an implantable loop recorder. Previously noted endotracheal and feeding tubes have been removed. IMPRESSION: 1. Improving aeration in the lungs suggestive of resolving multilobar bilateral pneumonia. 2. Mild cardiomegaly. Electronically Signed   By: Vinnie Langton M.D.   On: 08/08/2021 10:07   CT HEAD WO CONTRAST  Result Date: 08/08/2021 CLINICAL DATA:  Neuro deficit, acute, stroke suspected Discharged from hospital 7 days ago for ARDS EXAM: CT HEAD WITHOUT CONTRAST TECHNIQUE: Contiguous axial images were obtained from the base of the skull through the vertex without intravenous contrast. RADIATION DOSE REDUCTION: This exam was performed according to the departmental dose-optimization program which includes automated exposure control, adjustment of the mA and/or kV according to patient size and/or use of iterative reconstruction technique. COMPARISON:  February 2021 FINDINGS: Brain: There is no acute intracranial hemorrhage, mass effect, or edema. Gray-white differentiation is preserved. There is no extra-axial fluid collection. Patchy hypoattenuation in the supratentorial white matter is nonspecific but may reflect similar mild to moderate chronic microvascular ischemic changes. Prominence of the ventricles and sulci reflects similar mild parenchymal volume loss. Vascular: No hyperdense vessel or unexpected calcification. Skull: Calvarium is unremarkable. Sinuses/Orbits: Polypoid paranasal sinus mucosal thickening. Orbits are unremarkable. Other: None. IMPRESSION: No acute intracranial abnormality. Chronic microvascular ischemic changes. Electronically Signed   By: Macy Mis M.D.   On: 08/08/2021 09:55      Read from yesterday at the [facility] per chart review  "  CT ANGIO BRAIN [HEAD] W&/WO CONTRAST-3D INCLUDED: ALEXSIS, BRANSCOM [id number] DOB-AUG 88, 1957 M  Exm Date: Aug 06, 2021'@12'$ :41 Req Phys: Coralee Pesa Loc: [facility]-EMERGENCY (Req'g Loc) Img Loc: CT RADIOLOGY Service: Unknown    (Case [id number] COMPLETE) CT ANGIO CIRCLE OF WILLIS HEAD W&(CT Detailed) [id number] Contrast Media : Non-ionic Iodinated Reason for Study: LUE weakness  (Case [id number] COMPLETE) CT ANGIO NECK [CAROTIDS &VERTEBRA(CT Detailed) [id number] Contrast Media : Non-ionic Iodinated  Clinical History:  Report Status: Verified Date Reported: Aug 06, 2021 Date Verified: Aug 06, 2021 Verifier E-Sig:/ES/M. Areatha Keas, M.D.  Report: ** CTA HEAD WITH CONTRAST ** ** CTA NECK WITH CONTRAST **   INDICATION: LUE weakness provided.   COMPARISONS: CT head, Aug 06, 2021; MRI brain, October 24, 2020   TECHNIQUE/PROTOCOL: Contrast enhanced head and neck CT  arteriogram was performed. Scanning performed during the arterial  phase from the thoracic inlet to the circle of Willis. 3D  reconstructions were performed to evaluate vascular anatomy. All  carotid measurements were made according to the NASCET criteria.    CONTRAST: 32m of Omnipaque 350 IV administered via power  injector at an injection rate of 4 mL/sec. IV contrast was  administered to improve disease detection and further define  anatomy.?? * Creatinine = 0.6 * Complications = No immediate patient complications or events noted.   FINDINGS: --BRAIN FINDINGS: Sequela of mild small vessel disease No hemorrhage, extra-axial fluid collection, cerebral edema, acute cortical infarction, mass, mass effect, midline shift. Ventricles and sulci are normal for patient age. Polypoid mucosal thickening of the bilateral maxillary sinuses. Orbits and cranium are unremarkable.    --NECK FINDINGS: Mild intralobular septal thickening of the  bilateral lung  apices. No cervical adenopathy. The nasopharynx,  oropharynx, hypopharynx, and larynx are normal. Normal parotid  and submandibular glands. Normal thyroid. Moderate degenerative  changes. Multiple tooth extractions.   --VASCULATURE FINDINGS: Arch & Subclavian Arteries: Common origin of the brachiocephalic and left common carotid arteries. Moderate  atherosclerosis of the aortic arch and great vessels without  evidence of flow-limiting stenosis Mild stenosis of the right  brachiocephalic artery. ICAs: 60-70% stenosis of the origin of the right ICA. No significant stenosis of the left ICA.  Patent bilaterally form the skull base to the carotid terminus.  MCAs: Normal bilaterally. ACAs: Normal bilaterally. P-Comms: Visualized bilaterally.. Vertebral arteries: Normal to the confluence with the basilar artery. Basilar artery: Normal.  PCAs: Normal bilaterally. SCAs: Normal bilaterally. PICAs: Normal bilaterally.     Impression:   1. 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 2. No evidence of stenosis, occlusion, or aneurysm of the intracranial vasculature.   Primary Interpreting Resident: [facility]  Electronically Signed By: DAreatha KeasElectronically Signed On: 08/06/2021 3:31 PM   Primary Diagnostic Code: SIGNIFICANT ABNORMALITY, ATTN NEEDED  Primary Interpreting Staff: MRicki Miller M.D., MD, [phone]([place] /MDW  " Procedures Procedures    Medications Ordered in ED Medications  morphine (PF) 4 MG/ML injection 4 mg (4 mg Intravenous Given 08/08/21 1305)  morphine (PF) 4 MG/ML injection 4 mg (4 mg Intravenous Given 08/08/21 1800)    ED Course/ Medical Decision Making/ A&P Clinical Course as of 08/08/21 1905  Thu Aug 08, 2021  1206 Troponin I (High Sensitivity)(!): 20 Improved from 3 weeks ago when was 40-50 [EH]  1247 I went to reevaluate patient. He reports significant pain in his left arm.  He is requesting pain medication.  [EH]   1328 Troponin I (High Sensitivity)(!): 22 Unchanged [EH]  1413 Patient is reevaluated.  He is using his left arm to hold his cell phone when I walk in.  He is freely able to move his arm.  He reports that it still feels tingly and like it is frost bit.  He expresses frustration that he has been here for multiple hours.  I explained to him that he is getting a MRI, we discussed what a MRI is and why its  indicated multiple times.  [EH]  6073 I personally spent over 20 minutes reviewing patient's outside records attempting to locate information about his loop recorder. It appears he has a [employer] LUX-Dx S# [id number]  This was communicated with MRI tech through secure chat. Attempt was made to obtain records from [place] for model number without success prior to the office closing at 5 PM. [EH]  1835 Patients wife brought in the cell phone linked with his loop recorder. Image obtained, sent to MRI through secure chat.  [EH]    Clinical Course User Index [EH] Lorin Glass, PA-C                            Medical Decision Making Amount and/or Complexity of Data Reviewed Independent Historian:     Details: Family at bed side External Data Reviewed: radiology and notes.    Details: Discharge summary from recent hospitalization.  Radiology report from CTA head and neck obtained yesterday at the [facility], note from ED visit yesterday at the [facility] and urgent care recently. Labs: ordered. Decision-making details documented in ED Course. Radiology: ordered.  Risk Prescription drug management. Parenteral controlled substances.   This patient presents to the ED for concern of left arm weakness, slurred speech onset 7 days ago this involves an extensive number of treatment options, and is a complaint that carries with it a high risk of complications and morbidity.  The differential diagnosis includes stroke, ACS, dissection, encephalopathy, toxin/drug, electrolyte derangement, complex migraine,  DM2   Co morbidities that complicate the patient evaluation  Loop recorder in place, recent hospitalization with prolonged ICU stay for ARDS   Lab Tests:  I Ordered, and personally interpreted labs.  The pertinent results include: Troponin x2 is improved from his baseline, delta is only 2 which is not clinically significant.  Ethanol is undetected, normal PT/INR and PTT. Hemoglobin is slightly low at 12.9 however consistent with his baseline.  No leukocytosis.  CMP shows mild hyperglycemia at 217.  BNP is not elevated.  UDS is negative.  UA does not show evidence of dehydration.   Imaging Studies ordered:  I ordered imaging studies including chest x-ray, CT head, MRI of brain and C-spine I independently visualized and interpreted imaging which showed no acute intracranial abnormalities.   Problem List / ED Course / Critical interventions / Medication management   I ordered medication including morphine for pain.  After patient received a dose of his morphine his left arm weakness improved significantly up to a 4+/5- out of 5.  His speech however remained with stuttering. Reevaluation of the patient after these medicines showed that the patient improved I have reviewed the patients home medicines and have made adjustments as needed   Social Determinants of Health:  Patient does not know the model of his loop recorder  At shift change care was transferred to Dr. Tomi Bamberger who will follow pending studies, re-evaulate and determine disposition.            Final Clinical Impression(s) / ED Diagnoses Final diagnoses:  None    Rx / DC Orders ED Discharge Orders     None         Ollen Gross 08/08/21 Skip Estimable, MD 08/08/21 2054

## 2021-08-14 ENCOUNTER — Encounter (HOSPITAL_COMMUNITY): Payer: Self-pay | Admitting: Emergency Medicine

## 2021-08-14 ENCOUNTER — Ambulatory Visit (HOSPITAL_COMMUNITY)
Admission: EM | Admit: 2021-08-14 | Discharge: 2021-08-14 | Disposition: A | Discharge status: Home / Self Care | Payer: Medicare Other | Attending: Family Medicine | Admitting: Family Medicine

## 2021-08-14 DIAGNOSIS — M5010 Cervical disc disorder with radiculopathy, unspecified cervical region: Secondary | ICD-10-CM | POA: Diagnosis not present

## 2021-08-14 MED ORDER — PREDNISONE 10 MG PO TABS: ORAL_TABLET | ORAL | 0 refills | Status: AC

## 2021-08-14 NOTE — ED Triage Notes (Signed)
Pt reports left arm pain x 3/4 weeks. States every since he left the hospital he has been having pain. States he was told he had arthritis.

## 2021-08-14 NOTE — ED Provider Notes (Signed)
[facility]    CSN: [id number] Arrival date & time: 08/14/21  4098      History   Chief Complaint Chief Complaint  Patient presents with   Arm Pain    HPI Zachary Briggs is a 66 y.o. male.   Left Arm Pain Patient is complaining of left arm pain over the last 3 to 4 weeks States that the only time he received much relief was when he received a Toradol injection here before presenting to the emergency department He states that this only lasted about a day He was given an anti-inflammatory medication by the emergency room recently that did not improve his pain at all He has not yet called the neurosurgeons that he was referred to He denies any changes in this pain and states that it is not worse and he does not have any new symptoms that are concerning He denies any new numbness and tingling, chest pain, difficulty with speech He was admitted to the [facility] at the end of May for syncope and ARDS He was seen in the emergency department for left arm pain and stuttering on 6/1 He had significant work-up for this including flat troponins, negative MRI brain He did have an MRI of the C-spine showing significant stenosis of the left more than the right especially in C7 distribution He was given an anti-inflammatory medicine to take at home He also has a loop recorder currently  Last A1c 6.6 in May Patient denies knowing that he has a history of diabetes and is not on any diabetes medication for this He does not check his sugars regularly     Past Medical History:  Diagnosis Date   Aortic atherosclerosis ([place]) 12/29/2015   Aortic stenosis 12/30/2015   AVA around 1.3-1.4 cm2, trivial MR, normal LA   size, normal IVC.   Back pain    Bulla of lung ([place]) 12/29/2015   Heart murmur    Heart valve disease    Hepatitis C    Hypertension    Hypokalemia    Pneumonia    Prediabetes 12/29/2015   Prostate cancer ([place])    Tobacco abuse     Patient Active Problem  List   Diagnosis Date Noted   Weakness acquired in ICU 07/29/2021   Acute encephalopathy    Community acquired pneumonia    Hypernatremia 07/21/2021   ARDS (adult respiratory distress syndrome) ([place]) 07/20/2021   Acute on chronic respiratory failure with hypoxia and hypercapnia ([place]) 07/20/2021   Obesity (BMI 30-39.9) 07/20/2021   Type 2 diabetes mellitus with hyperglycemia ([place]) 07/20/2021   Aortic stenosis 12/30/2015   Aortic atherosclerosis ([place]) 12/29/2015   Bulla of lung ([place]) 12/29/2015   Chest pain 12/28/2015   Back pain 12/28/2015   Hypertension    Tobacco abuse     Past Surgical History:  Procedure Laterality Date   BACK SURGERY         Home Medications    Prior to Admission medications   Medication Sig Start Date End Date Taking? Authorizing Provider  predniSONE (DELTASONE) 10 MG tablet Take 6 tablets (60 mg total) by mouth daily for 1 day, THEN 5 tablets (50 mg total) daily for 1 day, THEN 4 tablets (40 mg total) daily for 1 day, THEN 3 tablets (30 mg total) daily for 1 day, THEN 2 tablets (20 mg total) daily for 1 day, THEN 1 tablet (10 mg total) daily for 1 day. 08/14/21 08/20/21 Yes Nickolus Wadding, Bernita Raisin, DO  acetaminophen (  TYLENOL) 325 MG tablet Take 650 mg by mouth every 4 (four) hours as needed for mild pain or fever.    [provider]  amLODipine (NORVASC) 10 MG tablet Take 1 tablet (10 mg total) by mouth daily. 08/01/21 08/31/21  Idamae Schuller, MD  aspirin EC 81 MG tablet Take 81 mg by mouth daily. Patient not taking: Reported on 07/16/2021    [provider]  atorvastatin (LIPITOR) 40 MG tablet Take 1 tablet (40 mg total) by mouth daily. 08/01/21 08/31/21  Idamae Schuller, MD  carvedilol (COREG) 25 MG tablet Take 1 tablet (25 mg total) by mouth 2 (two) times daily with a meal. 07/31/21 08/30/21  Idamae Schuller, MD  etodolac (LODINE) 300 MG capsule Take 1 capsule (300 mg total) by mouth every 8 (eight) hours. 08/08/21   Dorie Rank, MD  omeprazole (PRILOSEC)  20 MG capsule Take 1 capsule (20 mg total) by mouth daily. 07/31/21 08/30/21  Idamae Schuller, MD  pregabalin (LYRICA) 200 MG capsule Take 1 capsule (200 mg total) by mouth 2 (two) times daily. 07/31/21 08/30/21  Idamae Schuller, MD  sertraline (ZOLOFT) 50 MG tablet Take 3 tablets (150 mg total) by mouth daily. 08/01/21 08/31/21  Idamae Schuller, MD  tamsulosin (FLOMAX) 0.4 MG CAPS capsule Take 1 capsule (0.4 mg total) by mouth at bedtime. 07/31/21 08/30/21  Idamae Schuller, MD  valsartan (DIOVAN) 80 MG tablet Take 1 tablet (80 mg total) by mouth in the morning, at noon, and at bedtime. 07/31/21 08/30/21  Idamae Schuller, MD    Family History Family History  Problem Relation Age of Onset   Cancer Mother    Cancer Father    Cancer Sister    Diabetes Mellitus II Brother     Social History Social History   Tobacco Use   Smoking status: Every Day    Types: Cigars   Smokeless tobacco: Never  Vaping Use   Vaping Use: Never used  Substance Use Topics   Alcohol use: Yes    Alcohol/week: 4.0 standard drinks    Types: 4 Cans of beer per week   Drug use: Yes    Types: Marijuana     Allergies   Patient has no known allergies.   Review of Systems Review of Systems  All other systems reviewed and are negative.  Per HPI Physical Exam Triage Vital Signs ED Triage Vitals  Enc Vitals Group     BP      Pulse      Resp      Temp      Temp src      SpO2      Weight      Height      Head Circumference      Peak Flow      Pain Score      Pain Loc      Pain Edu?      Excl. in [place]?    No data found.  Updated Vital Signs BP 114/76 (BP Location: Right Arm)   Pulse 82   Temp 98.2 F (36.8 C) (Oral)   Resp 18   SpO2 96%   Visual Acuity Right Eye Distance:   Left Eye Distance:   Bilateral Distance:    Right Eye Near:   Left Eye Near:    Bilateral Near:     Physical Exam Constitutional:      General: He is not in acute distress.    Appearance: Normal appearance. He is not ill-appearing.  HENT:     Head: Normocephalic and atraumatic.  Eyes:     Conjunctiva/sclera: Conjunctivae normal.  Cardiovascular:     Rate and Rhythm: Normal rate.  Pulmonary:     Effort: Pulmonary effort is normal. No respiratory distress.  Musculoskeletal:     Cervical back: Normal range of motion.     Comments: Neck/Back: - Inspection: no gross deformity or asymmetry, swelling or ecchymosis - Palpation: No TTP - ROM: ROM limited in all fields 2/2 pain at neck - Strength: 5/5 wrist flexion, extension, 4/5 biceps flexion, triceps extension, abduction. OK sign, interosseus strength 4/5 compared to right - Neuro: sensation intact in the C5-C8 nerve root distribution b/l    Skin:    General: Skin is warm and dry.  Neurological:     Mental Status: He is alert and oriented to person, place, and time.     Comments: Normal speech  Psychiatric:        Mood and Affect: Mood normal.        Behavior: Behavior normal.        Thought Content: Thought content normal.        Judgment: Judgment normal.     UC Treatments / Results  Labs (all labs ordered are listed, but only abnormal results are displayed) Labs Reviewed - No data to display  EKG   Radiology No results found.  Procedures Procedures (including critical care time)  Medications Ordered in UC Medications - No data to display  Initial Impression / Assessment and Plan / UC Course  I have reviewed the triage vital signs and the nursing notes.  Pertinent labs & imaging results that were available during my care of the patient were reviewed by me and considered in my medical decision making (see chart for details).     Patient has had negative cardiac and stroke work-up recently for the same symptoms.  He denies any changes in his symptoms.  He does have a loop recorder in place and is following with cardiology.  Given that he has not had change in his symptoms, only not improvement, this is most likely related to his severe cervical  radiculopathy with stenosis.  Recommend that he follow-up with neurosurgery soon given that he is already having some weakness.  Prescription provided for prednisone Dosepak.  Given ED precautions, see AVS.   Final Clinical Impressions(s) / UC Diagnoses   Final diagnoses:  Cervical disc disorder with radiculopathy of cervical region     Discharge Instructions      Given the findings on your MRI of the severe arthritic changes in your neck that can be causing the pain and weakness in your arm.  Please call the neurosurgeons at the number provided to schedule an appointment.  I have also given you a prescription for prednisone to see if this will help your pain.  Do not take the medication prescribed to you at the emergency department with this.  If you have severe worsening of your symptoms, especially if you experience new numbness and tingling, difficulty with speech, numbness and tingling in another portion of your body, chest pain, difficulty breathing, you should be seen at the emergency room right away.     ED Prescriptions     Medication Sig Dispense Auth. Provider   predniSONE (DELTASONE) 10 MG tablet Take 6 tablets (60 mg total) by mouth daily for 1 day, THEN 5 tablets (50 mg total) daily for 1 day, THEN 4 tablets (40 mg total) daily for 1  day, THEN 3 tablets (30 mg total) daily for 1 day, THEN 2 tablets (20 mg total) daily for 1 day, THEN 1 tablet (10 mg total) daily for 1 day. 21 tablet Reyne Falconi, Bernita Raisin, DO      PDMP not reviewed this encounter.   Cleophas Dunker, DO 08/14/21 1115

## 2021-08-14 NOTE — Discharge Instructions (Addendum)
Given the findings on your MRI of the severe arthritic changes in your neck that can be causing the pain and weakness in your arm.  Please call the neurosurgeons at the number provided to schedule an appointment.  I have also given you a prescription for prednisone to see if this will help your pain.  Do not take the medication prescribed to you at the emergency department with this.  If you have severe worsening of your symptoms, especially if you experience new numbness and tingling, difficulty with speech, numbness and tingling in another portion of your body, chest pain, difficulty breathing, you should be seen at the emergency room right away.

## 2021-08-16 LAB — FUNGUS CULTURE WITH STAIN

## 2021-08-16 LAB — FUNGUS CULTURE RESULT

## 2021-08-16 LAB — FUNGAL ORGANISM REFLEX

## 2021-08-25 ENCOUNTER — Encounter (HOSPITAL_COMMUNITY): Payer: Self-pay

## 2021-08-25 ENCOUNTER — Ambulatory Visit (HOSPITAL_COMMUNITY)
Admission: EM | Admit: 2021-08-25 | Discharge: 2021-08-25 | Disposition: A | Discharge status: Home / Self Care | Payer: Medicare Other | Attending: Family Medicine | Admitting: Family Medicine

## 2021-08-25 DIAGNOSIS — M5010 Cervical disc disorder with radiculopathy, unspecified cervical region: Secondary | ICD-10-CM

## 2021-08-25 DIAGNOSIS — M549 Dorsalgia, unspecified: Secondary | ICD-10-CM | POA: Diagnosis not present

## 2021-08-25 MED ORDER — IBUPROFEN 600 MG PO TABS: 600.0000 mg | ORAL_TABLET | Freq: Three times a day (TID) | ORAL | 0 refills | Status: DC | PRN

## 2021-08-25 MED ORDER — KETOROLAC TROMETHAMINE 30 MG/ML IJ SOLN
30.0000 mg | Freq: Once | INTRAMUSCULAR | Status: AC
  Administered 2021-08-25: 30 mg via INTRAMUSCULAR

## 2021-08-25 MED ORDER — BACLOFEN 10 MG PO TABS: 10.0000 mg | ORAL_TABLET | Freq: Three times a day (TID) | ORAL | 0 refills | Status: DC | PRN

## 2021-08-25 MED ORDER — KETOROLAC TROMETHAMINE 30 MG/ML IJ SOLN
INTRAMUSCULAR | Status: AC
  Filled 2021-08-25: qty 1

## 2021-08-25 NOTE — ED Provider Notes (Addendum)
[place]    CSN: [id number] Arrival date & time: 08/25/21  1011      History   Chief Complaint Chief Complaint  Patient presents with   Arm Pain    HPI Zachary Briggs is a 66 y.o. male.   HPI Here for continued pain in his left posterior upper shoulder and down into his left arm.  He has had an MRI that did show a good bit of degenerative changes in his C-spine.  Etodolac from the emergency room did not help.  GFR is normal on lab reviewed from June 1.  He is not currently taking any blood thinners.  He does have a loop recorder in place  He has called the neurosurgery office and has an appointment for mid July now.  No bowel or bladder incontinence  He is already on some pregabalin 200 mg 2 times daily, and he does not report any relief with that  Past Medical History:  Diagnosis Date   Aortic atherosclerosis ([place]) 12/29/2015   Aortic stenosis 12/30/2015   AVA around 1.3-1.4 cm2, trivial MR, normal LA   size, normal IVC.   Back pain    Bulla of lung ([place]) 12/29/2015   Heart murmur    Heart valve disease    Hepatitis C    Hypertension    Hypokalemia    Pneumonia    Prediabetes 12/29/2015   Prostate cancer ([place])    Tobacco abuse     Patient Active Problem List   Diagnosis Date Noted   Weakness acquired in ICU 07/29/2021   Acute encephalopathy    Community acquired pneumonia    Hypernatremia 07/21/2021   ARDS (adult respiratory distress syndrome) ([place]) 07/20/2021   Acute on chronic respiratory failure with hypoxia and hypercapnia ([place]) 07/20/2021   Obesity (BMI 30-39.9) 07/20/2021   Type 2 diabetes mellitus with hyperglycemia ([place]) 07/20/2021   Aortic stenosis 12/30/2015   Aortic atherosclerosis ([place]) 12/29/2015   Bulla of lung ([place]) 12/29/2015   Chest pain 12/28/2015   Back pain 12/28/2015   Hypertension    Tobacco abuse     Past Surgical History:  Procedure Laterality Date   BACK SURGERY         Home Medications    Prior  to Admission medications   Medication Sig Start Date End Date Taking? Authorizing Provider  baclofen (LIORESAL) 10 MG tablet Take 1 tablet (10 mg total) by mouth every 8 (eight) hours as needed for muscle spasms. 08/25/21  Yes Barrett Henle, MD  ibuprofen (ADVIL) 600 MG tablet Take 1 tablet (600 mg total) by mouth every 8 (eight) hours as needed (pain). 08/25/21  Yes Barrett Henle, MD  acetaminophen (TYLENOL) 325 MG tablet Take 650 mg by mouth every 4 (four) hours as needed for mild pain or fever.    [provider]  amLODipine (NORVASC) 10 MG tablet Take 1 tablet (10 mg total) by mouth daily. 08/01/21 08/31/21  Idamae Schuller, MD  atorvastatin (LIPITOR) 40 MG tablet Take 1 tablet (40 mg total) by mouth daily. 08/01/21 08/31/21  Idamae Schuller, MD  carvedilol (COREG) 25 MG tablet Take 1 tablet (25 mg total) by mouth 2 (two) times daily with a meal. 07/31/21 08/30/21  Idamae Schuller, MD  omeprazole (PRILOSEC) 20 MG capsule Take 1 capsule (20 mg total) by mouth daily. 07/31/21 08/30/21  Idamae Schuller, MD  pregabalin (LYRICA) 200 MG capsule Take 1 capsule (200 mg total) by mouth 2 (two) times daily. 07/31/21 08/30/21  Idamae Schuller, MD  sertraline (ZOLOFT) 50 MG tablet Take 3 tablets (150 mg total) by mouth daily. 08/01/21 08/31/21  Idamae Schuller, MD  tamsulosin (FLOMAX) 0.4 MG CAPS capsule Take 1 capsule (0.4 mg total) by mouth at bedtime. 07/31/21 08/30/21  Idamae Schuller, MD  valsartan (DIOVAN) 80 MG tablet Take 1 tablet (80 mg total) by mouth in the morning, at noon, and at bedtime. 07/31/21 08/30/21  Idamae Schuller, MD    Family History Family History  Problem Relation Age of Onset   Cancer Mother    Cancer Father    Cancer Sister    Diabetes Mellitus II Brother     Social History Social History   Tobacco Use   Smoking status: Every Day    Types: Cigars   Smokeless tobacco: Never  Vaping Use   Vaping Use: Never used  Substance Use Topics   Alcohol use: Yes    Alcohol/week: 4.0 standard  drinks of alcohol    Types: 4 Cans of beer per week   Drug use: Yes    Types: Marijuana     Allergies   Patient has no known allergies.   Review of Systems Review of Systems   Physical Exam Triage Vital Signs ED Triage Vitals  Enc Vitals Group     BP 08/25/21 1120 132/85     Pulse Rate 08/25/21 1120 85     Resp 08/25/21 1120 18     Temp 08/25/21 1120 98.2 F (36.8 C)     Temp Source 08/25/21 1120 Oral     SpO2 08/25/21 1120 93 %     Weight --      Height --      Head Circumference --      Peak Flow --      Pain Score 08/25/21 1122 9     Pain Loc --      Pain Edu? --      Excl. in [place]? --    No data found.  Updated Vital Signs BP 132/85 (BP Location: Right Arm)   Pulse 85   Temp 98.2 F (36.8 C) (Oral)   Resp 18   SpO2 93%   Visual Acuity Right Eye Distance:   Left Eye Distance:   Bilateral Distance:    Right Eye Near:   Left Eye Near:    Bilateral Near:     Physical Exam Vitals reviewed.  Constitutional:      General: He is not in acute distress.    Appearance: He is not toxic-appearing.  HENT:     Mouth/Throat:     Mouth: Mucous membranes are moist.  Eyes:     Extraocular Movements: Extraocular movements intact.     Pupils: Pupils are equal, round, and reactive to light.  Cardiovascular:     Rate and Rhythm: Normal rate and regular rhythm.     Heart sounds: No murmur heard. Pulmonary:     Effort: Pulmonary effort is normal.     Breath sounds: No stridor. No wheezing, rhonchi or rales.  Musculoskeletal:     Comments: There is some tenderness of the left trapezius.  There is good range of motion about the left shoulder.  Good grip strength in the left hand  Neurological:     Mental Status: He is oriented to person, place, and time.  Psychiatric:        Behavior: Behavior normal.      UC Treatments / Results  Labs (all labs ordered are listed, but only  abnormal results are displayed) Labs Reviewed - No data to  display  EKG   Radiology No results found.  Procedures Procedures (including critical care time)  Medications Ordered in UC Medications  ketorolac (TORADOL) 30 MG/ML injection 30 mg (has no administration in time range)    Initial Impression / Assessment and Plan / UC Course  I have reviewed the triage vital signs and the nursing notes.  Pertinent labs & imaging results that were available during my care of the patient were reviewed by me and considered in my medical decision making (see chart for details).     Since his renal function is good, we will give him another shot of Toradol and have him try prescription strength ibuprofen as needed for pain.  He will keep his appointment for the neurosurgery office Final Clinical Impressions(s) / UC Diagnoses   Final diagnoses:  Cervical disc disorder with radiculopathy of cervical region  Upper back pain on left side     Discharge Instructions      You have been given a shot of Toradol 30 mg today.  Take ibuprofen 600 mg--1 tab every 8 hours as needed for pain.  Take baclofen 10 mg--1 every 8 hours as needed for muscle pain or spasms.  This medication can make you sleepy or dizzy.       ED Prescriptions     Medication Sig Dispense Auth. Provider   baclofen (LIORESAL) 10 MG tablet Take 1 tablet (10 mg total) by mouth every 8 (eight) hours as needed for muscle spasms. 30 each Barrett Henle, MD   ibuprofen (ADVIL) 600 MG tablet Take 1 tablet (600 mg total) by mouth every 8 (eight) hours as needed (pain). 90 tablet Jourdyn Hasler, Gwenlyn Perking, MD      I have reviewed the PDMP during this encounter.   Barrett Henle, MD 08/25/21 1144    Barrett Henle, MD 08/25/21 1144

## 2021-08-25 NOTE — Discharge Instructions (Addendum)
You have been given a shot of Toradol 30 mg today.  Take ibuprofen 600 mg--1 tab every 8 hours as needed for pain.  Take baclofen 10 mg--1 every 8 hours as needed for muscle pain or spasms.  This medication can make you sleepy or dizzy.

## 2021-08-25 NOTE — ED Triage Notes (Signed)
Pt presents with chronic left arm pain from fingertips to shoulder for over a month; pt states he has arthritis in that arm.

## 2021-08-27 ENCOUNTER — Other Ambulatory Visit: Payer: Self-pay | Admitting: Internal Medicine

## 2021-10-21 ENCOUNTER — Other Ambulatory Visit: Payer: Self-pay | Admitting: Internal Medicine

## 2021-10-22 ENCOUNTER — Other Ambulatory Visit: Payer: Self-pay

## 2021-10-26 ENCOUNTER — Other Ambulatory Visit: Payer: Self-pay | Admitting: Internal Medicine

## 2021-11-15 ENCOUNTER — Other Ambulatory Visit: Payer: Self-pay | Admitting: Internal Medicine

## 2021-12-01 ENCOUNTER — Other Ambulatory Visit: Payer: Self-pay | Admitting: Internal Medicine

## 2022-07-07 ENCOUNTER — Ambulatory Visit (INDEPENDENT_AMBULATORY_CARE_PROVIDER_SITE_OTHER): Payer: No Typology Code available for payment source | Admitting: Podiatry

## 2022-07-07 DIAGNOSIS — M79674 Pain in right toe(s): Secondary | ICD-10-CM

## 2022-07-07 DIAGNOSIS — E1165 Type 2 diabetes mellitus with hyperglycemia: Secondary | ICD-10-CM

## 2022-07-07 DIAGNOSIS — B351 Tinea unguium: Secondary | ICD-10-CM | POA: Diagnosis not present

## 2022-07-07 DIAGNOSIS — M79675 Pain in left toe(s): Secondary | ICD-10-CM | POA: Diagnosis not present

## 2022-07-07 NOTE — Progress Notes (Signed)
  Subjective:  Patient ID: Zachary Briggs, male    DOB: 1955/09/08,   MRN: [id number]  Chief Complaint  Patient presents with   Diabetes    Foot exam nail trim     67 y.o. male presents for concern of thickened elongated and painful nails that are difficult to trim. Requesting to have them trimmed today. Relates burning and tingling in their feet. Patient is diabetic and last A1c was  Lab Results  Component Value Date   HGBA1C 6.6 (H) 07/16/2021   .   PCP:  [facility]    . Denies any other pedal complaints. Denies n/v/f/c.   Past Medical History:  Diagnosis Date   Aortic atherosclerosis ([facility]) 12/29/2015   Aortic stenosis 12/30/2015   AVA around 1.3-1.4 cm2, trivial MR, normal LA   size, normal IVC.   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   Heart murmur    Heart valve disease    Hepatitis C    Hypertension    Hypokalemia    Pneumonia    Prediabetes 12/29/2015   Prostate cancer ([facility])    Tobacco abuse     Objective:  Physical Exam: Vascular: DP/PT pulses 2/4 bilateral. CFT <3 seconds. Absent hair growth on digits. Edema noted to bilateral lower extremities. Xerosis noted bilaterally.  Skin. No lacerations or abrasions bilateral feet. Nails 1-5 bilateral  are thickened discolored and elongated with subungual debris.  Musculoskeletal: MMT 5/5 bilateral lower extremities in DF, PF, Inversion and Eversion. Deceased ROM in DF of ankle joint.  Neurological: Sensation intact to light touch. Protective sensation diminished bilateral.    Assessment:   1. Pain due to onychomycosis of toenails of both feet   2. Type 2 diabetes mellitus with hyperglycemia, without long-term current use of insulin (H[facility])      Plan:  Patient was evaluated and treated and all questions answered. -Discussed and educated patient on diabetic foot care, especially with  regards to the vascular, neurological and musculoskeletal systems.  -Stressed the importance of good glycemic  control and the detriment of not  controlling glucose levels in relation to the foot. -Discussed supportive shoes at all times and checking feet regularly.  -Mechanically debrided all nails 1-5 bilateral using sterile nail nipper and filed with dremel without incident  -Answered all patient questions -Patient to return  in 3 months for at risk foot care -Patient advised to call the office if any problems or questions arise in the meantime.   Louann Sjogren, DPM

## 2022-07-10 ENCOUNTER — Encounter (HOSPITAL_COMMUNITY): Payer: Self-pay | Admitting: Emergency Medicine

## 2022-07-10 ENCOUNTER — Emergency Department (HOSPITAL_COMMUNITY): Payer: Medicare HMO

## 2022-07-10 ENCOUNTER — Inpatient Hospital Stay (HOSPITAL_COMMUNITY)
Admission: EM | Admit: 2022-07-10 | Discharge: 2022-07-23 | DRG: 917 | Disposition: A | Discharge status: Home / Self Care | Payer: Medicare HMO | Attending: Internal Medicine | Admitting: Internal Medicine

## 2022-07-10 ENCOUNTER — Inpatient Hospital Stay (HOSPITAL_COMMUNITY): Payer: Medicare HMO

## 2022-07-10 DIAGNOSIS — R7989 Other specified abnormal findings of blood chemistry: Secondary | ICD-10-CM | POA: Diagnosis not present

## 2022-07-10 DIAGNOSIS — R918 Other nonspecific abnormal finding of lung field: Secondary | ICD-10-CM | POA: Diagnosis not present

## 2022-07-10 DIAGNOSIS — J9601 Acute respiratory failure with hypoxia: Secondary | ICD-10-CM | POA: Diagnosis not present

## 2022-07-10 DIAGNOSIS — Z833 Family history of diabetes mellitus: Secondary | ICD-10-CM

## 2022-07-10 DIAGNOSIS — J440 Chronic obstructive pulmonary disease with acute lower respiratory infection: Secondary | ICD-10-CM | POA: Diagnosis present

## 2022-07-10 DIAGNOSIS — T405X1A Poisoning by cocaine, accidental (unintentional), initial encounter: Secondary | ICD-10-CM | POA: Diagnosis not present

## 2022-07-10 DIAGNOSIS — N400 Enlarged prostate without lower urinary tract symptoms: Secondary | ICD-10-CM | POA: Diagnosis present

## 2022-07-10 DIAGNOSIS — I2510 Atherosclerotic heart disease of native coronary artery without angina pectoris: Secondary | ICD-10-CM | POA: Diagnosis present

## 2022-07-10 DIAGNOSIS — R6889 Other general symptoms and signs: Secondary | ICD-10-CM | POA: Diagnosis not present

## 2022-07-10 DIAGNOSIS — I110 Hypertensive heart disease with heart failure: Secondary | ICD-10-CM | POA: Diagnosis not present

## 2022-07-10 DIAGNOSIS — K219 Gastro-esophageal reflux disease without esophagitis: Secondary | ICD-10-CM | POA: Diagnosis present

## 2022-07-10 DIAGNOSIS — R7303 Prediabetes: Secondary | ICD-10-CM | POA: Diagnosis present

## 2022-07-10 DIAGNOSIS — J90 Pleural effusion, not elsewhere classified: Secondary | ICD-10-CM | POA: Diagnosis not present

## 2022-07-10 DIAGNOSIS — J984 Other disorders of lung: Secondary | ICD-10-CM

## 2022-07-10 DIAGNOSIS — I350 Nonrheumatic aortic (valve) stenosis: Secondary | ICD-10-CM | POA: Diagnosis present

## 2022-07-10 DIAGNOSIS — Z8546 Personal history of malignant neoplasm of prostate: Secondary | ICD-10-CM | POA: Diagnosis not present

## 2022-07-10 DIAGNOSIS — G8929 Other chronic pain: Secondary | ICD-10-CM | POA: Diagnosis present

## 2022-07-10 DIAGNOSIS — F1490 Cocaine use, unspecified, uncomplicated: Secondary | ICD-10-CM | POA: Diagnosis not present

## 2022-07-10 DIAGNOSIS — T380X5A Adverse effect of glucocorticoids and synthetic analogues, initial encounter: Secondary | ICD-10-CM | POA: Diagnosis present

## 2022-07-10 DIAGNOSIS — J439 Emphysema, unspecified: Secondary | ICD-10-CM | POA: Diagnosis present

## 2022-07-10 DIAGNOSIS — G9341 Metabolic encephalopathy: Secondary | ICD-10-CM | POA: Diagnosis present

## 2022-07-10 DIAGNOSIS — R739 Hyperglycemia, unspecified: Secondary | ICD-10-CM | POA: Diagnosis present

## 2022-07-10 DIAGNOSIS — Z6830 Body mass index (BMI) 30.0-30.9, adult: Secondary | ICD-10-CM

## 2022-07-10 DIAGNOSIS — E8729 Other acidosis: Secondary | ICD-10-CM | POA: Diagnosis not present

## 2022-07-10 DIAGNOSIS — Z1152 Encounter for screening for COVID-19: Secondary | ICD-10-CM

## 2022-07-10 DIAGNOSIS — R0603 Acute respiratory distress: Secondary | ICD-10-CM | POA: Diagnosis not present

## 2022-07-10 DIAGNOSIS — J189 Pneumonia, unspecified organism: Secondary | ICD-10-CM | POA: Diagnosis not present

## 2022-07-10 DIAGNOSIS — R0602 Shortness of breath: Secondary | ICD-10-CM | POA: Diagnosis not present

## 2022-07-10 DIAGNOSIS — J129 Viral pneumonia, unspecified: Secondary | ICD-10-CM | POA: Diagnosis not present

## 2022-07-10 DIAGNOSIS — E669 Obesity, unspecified: Secondary | ICD-10-CM | POA: Diagnosis present

## 2022-07-10 DIAGNOSIS — F17290 Nicotine dependence, other tobacco product, uncomplicated: Secondary | ICD-10-CM | POA: Diagnosis present

## 2022-07-10 DIAGNOSIS — E876 Hypokalemia: Secondary | ICD-10-CM | POA: Diagnosis present

## 2022-07-10 DIAGNOSIS — J9600 Acute respiratory failure, unspecified whether with hypoxia or hypercapnia: Secondary | ICD-10-CM | POA: Diagnosis present

## 2022-07-10 DIAGNOSIS — J9690 Respiratory failure, unspecified, unspecified whether with hypoxia or hypercapnia: Secondary | ICD-10-CM | POA: Diagnosis not present

## 2022-07-10 DIAGNOSIS — Z79899 Other long term (current) drug therapy: Secondary | ICD-10-CM

## 2022-07-10 DIAGNOSIS — K8020 Calculus of gallbladder without cholecystitis without obstruction: Secondary | ICD-10-CM | POA: Diagnosis not present

## 2022-07-10 DIAGNOSIS — R0902 Hypoxemia: Secondary | ICD-10-CM | POA: Diagnosis not present

## 2022-07-10 DIAGNOSIS — F1410 Cocaine abuse, uncomplicated: Secondary | ICD-10-CM | POA: Diagnosis not present

## 2022-07-10 DIAGNOSIS — I499 Cardiac arrhythmia, unspecified: Secondary | ICD-10-CM | POA: Diagnosis not present

## 2022-07-10 DIAGNOSIS — I5033 Acute on chronic diastolic (congestive) heart failure: Secondary | ICD-10-CM | POA: Diagnosis present

## 2022-07-10 DIAGNOSIS — E785 Hyperlipidemia, unspecified: Secondary | ICD-10-CM | POA: Diagnosis present

## 2022-07-10 DIAGNOSIS — F419 Anxiety disorder, unspecified: Secondary | ICD-10-CM | POA: Diagnosis present

## 2022-07-10 DIAGNOSIS — J441 Chronic obstructive pulmonary disease with (acute) exacerbation: Secondary | ICD-10-CM | POA: Diagnosis present

## 2022-07-10 DIAGNOSIS — R748 Abnormal levels of other serum enzymes: Secondary | ICD-10-CM | POA: Diagnosis not present

## 2022-07-10 DIAGNOSIS — K760 Fatty (change of) liver, not elsewhere classified: Secondary | ICD-10-CM | POA: Diagnosis not present

## 2022-07-10 DIAGNOSIS — J811 Chronic pulmonary edema: Secondary | ICD-10-CM | POA: Diagnosis not present

## 2022-07-10 LAB — BASIC METABOLIC PANEL
Anion gap: 15 (ref 5–15)
BUN: 15 mg/dL (ref 8–23)
CO2: 19 mmol/L — ABNORMAL LOW (ref 22–32)
Calcium: 8.4 mg/dL — ABNORMAL LOW (ref 8.9–10.3)
Chloride: 103 mmol/L (ref 98–111)
Creatinine, Ser: 1.08 mg/dL (ref 0.61–1.24)
GFR, Estimated: >60 mL/min (ref >60)
Glucose, Bld: 191 mg/dL — ABNORMAL HIGH (ref 70–99)
Potassium: 3.5 mmol/L (ref 3.5–5.1)
Sodium: 137 mmol/L (ref 135–145)

## 2022-07-10 LAB — RESPIRATORY PANEL BY PCR

## 2022-07-10 LAB — I-STAT CHEM 8, ED
BUN: 16 mg/dL (ref 8–23)
Calcium, Ion: 1.13 mmol/L — ABNORMAL LOW (ref 1.15–1.40)
Chloride: 105 mmol/L (ref 98–111)
Creatinine, Ser: 0.9 mg/dL (ref 0.61–1.24)
Glucose, Bld: 216 mg/dL — ABNORMAL HIGH (ref 70–99)
HCT: 45 % (ref 39.0–52.0)
Hemoglobin: 15.3 g/dL (ref 13.0–17.0)
Potassium: 3.2 mmol/L — ABNORMAL LOW (ref 3.5–5.1)
Sodium: 138 mmol/L (ref 135–145)
TCO2: 19 mmol/L — ABNORMAL LOW (ref 22–32)

## 2022-07-10 LAB — GLUCOSE, CAPILLARY
Glucose-Capillary: 176 mg/dL — ABNORMAL HIGH (ref 70–99)
Glucose-Capillary: 183 mg/dL — ABNORMAL HIGH (ref 70–99)
Glucose-Capillary: 198 mg/dL — ABNORMAL HIGH (ref 70–99)

## 2022-07-10 LAB — BRAIN NATRIURETIC PEPTIDE: B Natriuretic Peptide: 501.1 pg/mL — ABNORMAL HIGH (ref 0.0–100.0)

## 2022-07-10 LAB — CBC WITH DIFFERENTIAL/PLATELET
Abs Immature Granulocytes: 0.17 10*3/uL — ABNORMAL HIGH (ref 0.00–0.07)
Basophils Absolute: 0.1 10*3/uL (ref 0.0–0.1)
Basophils Relative: 0 %
Eosinophils Absolute: 0 10*3/uL (ref 0.0–0.5)
Eosinophils Relative: 0 %
HCT: 41.1 % (ref 39.0–52.0)
Hemoglobin: 13.8 g/dL (ref 13.0–17.0)
Immature Granulocytes: 1 %
Lymphocytes Relative: 11 %
Lymphs Abs: 2.2 10*3/uL (ref 0.7–4.0)
MCH: 26.6 pg (ref 26.0–34.0)
MCHC: 33.6 g/dL (ref 30.0–36.0)
MCV: 79.3 fL — ABNORMAL LOW (ref 80.0–100.0)
Monocytes Absolute: 0.6 10*3/uL (ref 0.1–1.0)
Monocytes Relative: 3 %
Neutro Abs: 16.6 10*3/uL — ABNORMAL HIGH (ref 1.7–7.7)
Neutrophils Relative %: 85 %
Platelets: 236 10*3/uL (ref 150–400)
RBC: 5.18 MIL/uL (ref 4.22–5.81)
RDW: 17.1 % — ABNORMAL HIGH (ref 11.5–15.5)
WBC: 19.6 10*3/uL — ABNORMAL HIGH (ref 4.0–10.5)
nRBC: 0.1 % (ref 0.0–0.2)

## 2022-07-10 LAB — COMPREHENSIVE METABOLIC PANEL
ALT: 30 U/L (ref 0–44)
AST: 32 U/L (ref 15–41)
Albumin: 3.1 g/dL — ABNORMAL LOW (ref 3.5–5.0)
Alkaline Phosphatase: 123 U/L (ref 38–126)
Anion gap: 20 — ABNORMAL HIGH (ref 5–15)
BUN: 15 mg/dL (ref 8–23)
CO2: 16 mmol/L — ABNORMAL LOW (ref 22–32)
Calcium: 8.9 mg/dL (ref 8.9–10.3)
Chloride: 100 mmol/L (ref 98–111)
Creatinine, Ser: 1.2 mg/dL (ref 0.61–1.24)
GFR, Estimated: >60 mL/min (ref >60)
Glucose, Bld: 210 mg/dL — ABNORMAL HIGH (ref 70–99)
Potassium: 3.2 mmol/L — ABNORMAL LOW (ref 3.5–5.1)
Sodium: 136 mmol/L (ref 135–145)
Total Bilirubin: 1.2 mg/dL (ref 0.3–1.2)
Total Protein: 7.4 g/dL (ref 6.5–8.1)

## 2022-07-10 LAB — SEDIMENTATION RATE: Sed Rate: 54 mm/hr — ABNORMAL HIGH (ref 0–16)

## 2022-07-10 LAB — POCT I-STAT 7, (LYTES, BLD GAS, ICA,H+H)
Acid-base deficit: 4 mmol/L — ABNORMAL HIGH (ref 0.0–2.0)
Bicarbonate: 19.5 mmol/L — ABNORMAL LOW (ref 20.0–28.0)
Calcium, Ion: 1.12 mmol/L — ABNORMAL LOW (ref 1.15–1.40)
HCT: 42 % (ref 39.0–52.0)
Hemoglobin: 14.3 g/dL (ref 13.0–17.0)
O2 Saturation: 98 %
Patient temperature: 99
Potassium: 3.6 mmol/L (ref 3.5–5.1)
Sodium: 138 mmol/L (ref 135–145)
TCO2: 20 mmol/L — ABNORMAL LOW (ref 22–32)
pCO2 arterial: 31.2 mmHg — ABNORMAL LOW (ref 32–48)
pH, Arterial: 7.404 (ref 7.35–7.45)
pO2, Arterial: 97 mmHg (ref 83–108)

## 2022-07-10 LAB — I-STAT ARTERIAL BLOOD GAS, ED
Acid-base deficit: 4 mmol/L — ABNORMAL HIGH (ref 0.0–2.0)
Bicarbonate: 19.8 mmol/L — ABNORMAL LOW (ref 20.0–28.0)
Calcium, Ion: 1.16 mmol/L (ref 1.15–1.40)
HCT: 42 % (ref 39.0–52.0)
Hemoglobin: 14.3 g/dL (ref 13.0–17.0)
O2 Saturation: 98 %
Patient temperature: 99.5
Potassium: 3.1 mmol/L — ABNORMAL LOW (ref 3.5–5.1)
Sodium: 137 mmol/L (ref 135–145)
TCO2: 21 mmol/L — ABNORMAL LOW (ref 22–32)
pCO2 arterial: 33.3 mmHg (ref 32–48)
pH, Arterial: 7.385 (ref 7.35–7.45)
pO2, Arterial: 102 mmHg (ref 83–108)

## 2022-07-10 LAB — CREATININE, SERUM
Creatinine, Ser: 1.19 mg/dL (ref 0.61–1.24)
GFR, Estimated: >60 mL/min (ref >60)

## 2022-07-10 LAB — LACTIC ACID, PLASMA
Lactic Acid, Venous: 1.4 mmol/L (ref 0.5–1.9)
Lactic Acid, Venous: 1.3 mmol/L (ref 0.5–1.9)

## 2022-07-10 LAB — MRSA NEXT GEN BY PCR, NASAL: MRSA by PCR Next Gen: NOT DETECTED

## 2022-07-10 LAB — TROPONIN I (HIGH SENSITIVITY)
Troponin I (High Sensitivity): 39 ng/L — ABNORMAL HIGH (ref <18)
Troponin I (High Sensitivity): 60 ng/L — ABNORMAL HIGH (ref <18)

## 2022-07-10 LAB — PROTIME-INR
INR: 1.2 (ref 0.8–1.2)
Prothrombin Time: 14.8 seconds (ref 11.4–15.2)

## 2022-07-10 LAB — CBC
HCT: 44.6 % (ref 39.0–52.0)
Hemoglobin: 14.7 g/dL (ref 13.0–17.0)
MCH: 26.2 pg (ref 26.0–34.0)
MCHC: 33 g/dL (ref 30.0–36.0)
MCV: 79.5 fL — ABNORMAL LOW (ref 80.0–100.0)
Platelets: 241 10*3/uL (ref 150–400)
RBC: 5.61 MIL/uL (ref 4.22–5.81)
RDW: 17 % — ABNORMAL HIGH (ref 11.5–15.5)
WBC: 18.5 10*3/uL — ABNORMAL HIGH (ref 4.0–10.5)
nRBC: 0 % (ref 0.0–0.2)

## 2022-07-10 LAB — LIPASE, BLOOD: Lipase: 22 U/L (ref 11–51)

## 2022-07-10 LAB — C-REACTIVE PROTEIN: CRP: 36.9 mg/dL — ABNORMAL HIGH (ref <1.0)

## 2022-07-10 LAB — PROCALCITONIN: Procalcitonin: 1.09 ng/mL

## 2022-07-10 LAB — D-DIMER, QUANTITATIVE: D-Dimer, Quant: 1.76 ug/mL-FEU — ABNORMAL HIGH (ref 0.00–0.50)

## 2022-07-10 LAB — HEMOGLOBIN A1C
Hgb A1c MFr Bld: 6.4 % — ABNORMAL HIGH (ref 4.8–5.6)
Mean Plasma Glucose: 136.98 mg/dL

## 2022-07-10 LAB — SARS CORONAVIRUS 2 BY RT PCR: SARS Coronavirus 2 by RT PCR: NEGATIVE

## 2022-07-10 LAB — BETA-HYDROXYBUTYRIC ACID: Beta-Hydroxybutyric Acid: 1.63 mmol/L — ABNORMAL HIGH (ref 0.05–0.27)

## 2022-07-10 MED ORDER — DEXTROSE 50 % IV SOLN: 0.0000 mL | INTRAVENOUS | Status: DC | PRN

## 2022-07-10 MED ORDER — SODIUM CHLORIDE 0.9 % IV SOLN
2.0000 g | INTRAVENOUS | Status: DC
  Administered 2022-07-10 – 2022-07-13 (×4): 2 g via INTRAVENOUS
  Filled 2022-07-10 (×4): qty 20

## 2022-07-10 MED ORDER — SODIUM CHLORIDE 0.9 % IV SOLN
500.0000 mg | INTRAVENOUS | Status: DC
  Administered 2022-07-10 – 2022-07-13 (×4): 500 mg via INTRAVENOUS
  Filled 2022-07-10 (×5): qty 5

## 2022-07-10 MED ORDER — POTASSIUM CHLORIDE 10 MEQ/100ML IV SOLN
10.0000 meq | INTRAVENOUS | Status: AC
  Administered 2022-07-10 (×5): 10 meq via INTRAVENOUS
  Filled 2022-07-10 (×5): qty 100

## 2022-07-10 MED ORDER — INSULIN REGULAR(HUMAN) IN NACL 100-0.9 UT/100ML-% IV SOLN
INTRAVENOUS | Status: DC
  Filled 2022-07-10: qty 100

## 2022-07-10 MED ORDER — IPRATROPIUM-ALBUTEROL 0.5-2.5 (3) MG/3ML IN SOLN
3.0000 mL | Freq: Four times a day (QID) | RESPIRATORY_TRACT | Status: DC
  Administered 2022-07-11 – 2022-07-17 (×26): 3 mL via RESPIRATORY_TRACT
  Filled 2022-07-10 (×26): qty 3

## 2022-07-10 MED ORDER — ORAL CARE MOUTH RINSE
15.0000 mL | OROMUCOSAL | Status: DC
  Administered 2022-07-10 – 2022-07-23 (×31): 15 mL via OROMUCOSAL

## 2022-07-10 MED ORDER — IPRATROPIUM-ALBUTEROL 0.5-2.5 (3) MG/3ML IN SOLN
RESPIRATORY_TRACT | Status: AC
  Filled 2022-07-10: qty 3

## 2022-07-10 MED ORDER — FUROSEMIDE 10 MG/ML IJ SOLN
40.0000 mg | Freq: Once | INTRAMUSCULAR | Status: AC
  Administered 2022-07-10: 40 mg via INTRAVENOUS
  Filled 2022-07-10: qty 4

## 2022-07-10 MED ORDER — CHLORHEXIDINE GLUCONATE CLOTH 2 % EX PADS
6.0000 | MEDICATED_PAD | Freq: Every day | CUTANEOUS | Status: DC
  Administered 2022-07-10 – 2022-07-22 (×11): 6 via TOPICAL

## 2022-07-10 MED ORDER — SERTRALINE HCL 50 MG PO TABS
150.0000 mg | ORAL_TABLET | Freq: Every day | ORAL | Status: DC
  Administered 2022-07-11 – 2022-07-22 (×12): 150 mg via ORAL
  Filled 2022-07-10 (×12): qty 1

## 2022-07-10 MED ORDER — INSULIN ASPART 100 UNIT/ML IJ SOLN
0.0000 [IU] | INTRAMUSCULAR | Status: DC
  Administered 2022-07-10: 4 [IU] via SUBCUTANEOUS

## 2022-07-10 MED ORDER — PANTOPRAZOLE SODIUM 40 MG IV SOLR
40.0000 mg | Freq: Two times a day (BID) | INTRAVENOUS | Status: DC
  Administered 2022-07-10 – 2022-07-11 (×4): 40 mg via INTRAVENOUS
  Filled 2022-07-10 (×3): qty 10

## 2022-07-10 MED ORDER — ORAL CARE MOUTH RINSE: 15.0000 mL | OROMUCOSAL | Status: DC | PRN

## 2022-07-10 MED ORDER — IOHEXOL 350 MG/ML SOLN
75.0000 mL | Freq: Once | INTRAVENOUS | Status: AC | PRN
  Administered 2022-07-10: 75 mL via INTRAVENOUS

## 2022-07-10 MED ORDER — HYDRALAZINE HCL 20 MG/ML IJ SOLN
10.0000 mg | INTRAMUSCULAR | Status: DC | PRN
  Administered 2022-07-12: 10 mg via INTRAVENOUS
  Filled 2022-07-10: qty 1

## 2022-07-10 MED ORDER — HEPARIN SODIUM (PORCINE) 5000 UNIT/ML IJ SOLN
5000.0000 [IU] | Freq: Three times a day (TID) | INTRAMUSCULAR | Status: DC
  Administered 2022-07-10 – 2022-07-23 (×39): 5000 [IU] via SUBCUTANEOUS
  Filled 2022-07-10 (×40): qty 1

## 2022-07-10 MED ORDER — HYDROCORTISONE SOD SUC (PF) 100 MG IJ SOLR
100.0000 mg | Freq: Two times a day (BID) | INTRAMUSCULAR | Status: DC
  Administered 2022-07-10 – 2022-07-11 (×2): 100 mg via INTRAVENOUS
  Filled 2022-07-10 (×2): qty 2

## 2022-07-10 MED ORDER — IPRATROPIUM-ALBUTEROL 0.5-2.5 (3) MG/3ML IN SOLN
3.0000 mL | Freq: Once | RESPIRATORY_TRACT | Status: AC
  Administered 2022-07-10: 3 mL via RESPIRATORY_TRACT
  Filled 2022-07-10: qty 3

## 2022-07-10 MED ORDER — MAGNESIUM SULFATE 2 GM/50ML IV SOLN
2.0000 g | Freq: Once | INTRAVENOUS | Status: AC
  Administered 2022-07-10: 2 g via INTRAVENOUS
  Filled 2022-07-10: qty 50

## 2022-07-10 NOTE — ED Notes (Signed)
Pt Sating at 89 informed RN Camryn, She informed RESP TP [employer]

## 2022-07-10 NOTE — Hospital Course (Signed)
Zachary Briggs is a 67 yo male  On BiPAP - hypxoic from [facility]?  Had SOB today, called EMS Tripoding and 35% on room air  Placed on CPAP, got bronchodilators, mag, solumedrol, and then 65% on cPap and now on Bipap

## 2022-07-10 NOTE — Progress Notes (Signed)
RT note. Patient transported to CT and back to 73M rm 12 on bipap without any complications. Patient placed back on HHFNC back on arrival. RT will continue to monitor.

## 2022-07-10 NOTE — Progress Notes (Signed)
Paged for admission by EDP for acute hypoxic respiratory failure. Dr. Daiva Eves and I evaluated the patient at the bedside. The patient was on BiPAP with FiO2 of 80%. Respiratory was also at the bedside and we attempted to wean the FiO2 to 70%, however, the patient quickly desaturated with SpO2 in the low 80s and he became more tachypneic and unable to speak in full sentences. FiO2 was increased back to 80%. PCCM quickly came to the bedside and agreed with admission to the ICU. IMTS will sign off at this time.

## 2022-07-10 NOTE — ED Triage Notes (Signed)
Pt BIB EMS c/o SOB since yesterday, hx CHF. Abd distended. EMS reports RA sats 35% with good waveform, put on CPAP and Spo2 was 65% with good waveform. Given 2g Mg and 125 solumedrol by EMS. 18g R AC. Pt has not missed doses of home meds. Does not wear home O2. EMS VS 120/70, HR 120.

## 2022-07-10 NOTE — Progress Notes (Signed)
Pt transported on Bipap from [facility] to 3M12 without any complications. RN at beside, RT will monitor as needed.

## 2022-07-10 NOTE — Progress Notes (Signed)
   07/10/22 2123  Therapy Vitals  Pulse Rate 90  Resp (!) 28  MEWS Score/Color  MEWS Score 2  MEWS Score Color Yellow  Oxygen Therapy/Pulse Ox  O2 Device HFNC;Non-rebreather Mask  O2 Therapy Oxygen humidified  O2 Flow Rate (L/min) 75 L/min  FiO2 (%) 100 %  SpO2 90 %   Placed pt NRB on top of HFNC due to pt. Refusing to go back on the vent

## 2022-07-10 NOTE — Progress Notes (Signed)
   07/10/22 2010  Oxygen Therapy/Pulse Ox  O2 Device HFNC  O2 Therapy Oxygen  O2 Flow Rate (L/min) 60 L/min  FiO2 (%) 100 %   Pt. Was placed on HHFNC 60L 100% per pt. Request will monitor pt. RN aware

## 2022-07-10 NOTE — ED Provider Notes (Signed)
[place] [facility]MENT AT [facility] Provider Note   CSN: [id number] Arrival date & time: 07/10/22  1240     History  Chief Complaint  Patient presents with   Shortness of Breath    Zachary Briggs is a 67 y.o. male.  Level 5 caveat for acuity of condition.  Patient brought in by EMS with acute onset of difficulty breathing, hypoxia and respiratory distress.  Patient called EMS himself with difficulty breathing over the past 1 day.  On EMS arrival he had his abdomen with O2 saturation 35% on room air.  He was placed on CPAP and saturation only improved to 65%.  He is given 2 g of magnesium and 125 mg of Solu-Medrol.  He denies any missed doses of home meds.  No oxygen use at home.  Reports history of COPD, CHF and hypertension.  Denies chest pain.  Cough productive of clear mucus. Denies any history of blood clot.  Denies any history of heart attack.  No leg pain or leg swelling.  Does not know what medications he takes at home.  The history is provided by the patient and the EMS personnel. The history is limited by the condition of the patient.  Shortness of Breath      Home Medications Prior to Admission medications   Medication Sig Start Date End Date Taking? Authorizing Provider  acetaminophen (TYLENOL) 325 MG tablet Take 650 mg by mouth every 4 (four) hours as needed for mild pain or fever.    [provider]  amLODipine (NORVASC) 10 MG tablet Take 1 tablet (10 mg total) by mouth daily. 08/01/21 08/31/21  Gwenevere Abbot, MD  atorvastatin (LIPITOR) 40 MG tablet Take 1 tablet (40 mg total) by mouth daily. 08/01/21 08/31/21  Gwenevere Abbot, MD  baclofen (LIORESAL) 10 MG tablet Take 1 tablet (10 mg total) by mouth every 8 (eight) hours as needed for muscle spasms. 08/25/21   Zenia Resides, MD  carvedilol (COREG) 25 MG tablet Take 1 tablet (25 mg total) by mouth 2 (two) times daily with a meal. 07/31/21 08/30/21  Gwenevere Abbot, MD  ibuprofen (ADVIL) 600 MG  tablet Take 1 tablet (600 mg total) by mouth every 8 (eight) hours as needed (pain). 08/25/21   Zenia Resides, MD  omeprazole (PRILOSEC) 20 MG capsule Take 1 capsule (20 mg total) by mouth daily. 07/31/21 08/30/21  Gwenevere Abbot, MD  pregabalin (LYRICA) 200 MG capsule Take 1 capsule (200 mg total) by mouth 2 (two) times daily. 07/31/21 08/30/21  Gwenevere Abbot, MD  sertraline (ZOLOFT) 50 MG tablet Take 3 tablets (150 mg total) by mouth daily. 08/01/21 08/31/21  Gwenevere Abbot, MD  valsartan (DIOVAN) 80 MG tablet Take 1 tablet (80 mg total) by mouth in the morning, at noon, and at bedtime. 07/31/21 08/30/21  Gwenevere Abbot, MD      Allergies    Patient has no known allergies.    Review of Systems   Review of Systems  Unable to perform ROS: Severe respiratory distress  Respiratory:  Positive for shortness of breath.     Physical Exam Updated Vital Signs BP 119/83   Pulse (!) 105   Temp 99.5 F (37.5 C) (Axillary)   Resp (!) 27   SpO2 97%  Physical Exam Vitals and nursing note reviewed.  Constitutional:      General: He is in acute distress.     Appearance: He is well-developed. He is ill-appearing.     Comments: Severe respiratory  distress, speaking 1 word phrases  HENT:     Head: Normocephalic and atraumatic.     Mouth/Throat:     Pharynx: No oropharyngeal exudate.  Eyes:     Conjunctiva/sclera: Conjunctivae normal.     Pupils: Pupils are equal, round, and reactive to light.  Neck:     Comments: No meningismus. Cardiovascular:     Rate and Rhythm: Normal rate and regular rhythm.     Heart sounds: Normal heart sounds. No murmur heard. Pulmonary:     Effort: Respiratory distress present.     Breath sounds: Rhonchi present.     Comments: Scattered rhonchi throughout Abdominal:     Palpations: Abdomen is soft.     Tenderness: There is no abdominal tenderness. There is no guarding or rebound.  Musculoskeletal:        General: No tenderness. Normal range of motion.     Cervical back:  Normal range of motion and neck supple.     Right lower leg: No edema.     Left lower leg: No edema.     Comments: No significant leg swelling  Skin:    General: Skin is warm.  Neurological:     Mental Status: He is alert and oriented to person, place, and time.     Cranial Nerves: No cranial nerve deficit.     Motor: No abnormal muscle tone.     Coordination: Coordination normal.     Comments:  5/5 strength throughout. CN 2-12 intact.Equal grip strength.   Psychiatric:        Behavior: Behavior normal.     ED Results / Procedures / Treatments   Labs (all labs ordered are listed, but only abnormal results are displayed) Labs Reviewed  CBC WITH DIFFERENTIAL/PLATELET - Abnormal; Notable for the following components:      Result Value   WBC 19.6 (*)    MCV 79.3 (*)    RDW 17.1 (*)    Neutro Abs 16.6 (*)    Abs Immature Granulocytes 0.17 (*)    All other components within normal limits  COMPREHENSIVE METABOLIC PANEL - Abnormal; Notable for the following components:   Potassium 3.2 (*)    CO2 16 (*)    Glucose, Bld 210 (*)    Albumin 3.1 (*)    Anion gap 20 (*)    All other components within normal limits  BRAIN NATRIURETIC PEPTIDE - Abnormal; Notable for the following components:   B Natriuretic Peptide 501.1 (*)    All other components within normal limits  D-DIMER, QUANTITATIVE - Abnormal; Notable for the following components:   D-Dimer, Quant 1.76 (*)    All other components within normal limits  HEMOGLOBIN A1C - Abnormal; Notable for the following components:   Hgb A1c MFr Bld 6.4 (*)    All other components within normal limits  CBC - Abnormal; Notable for the following components:   WBC 18.5 (*)    MCV 79.5 (*)    RDW 17.0 (*)    All other components within normal limits  BETA-HYDROXYBUTYRIC ACID - Abnormal; Notable for the following components:   Beta-Hydroxybutyric Acid 1.63 (*)    All other components within normal limits  GLUCOSE, CAPILLARY - Abnormal;  Notable for the following components:   Glucose-Capillary 176 (*)    All other components within normal limits  I-STAT CHEM 8, ED - Abnormal; Notable for the following components:   Potassium 3.2 (*)    Glucose, Bld 216 (*)    Calcium, Ion  1.13 (*)    TCO2 19 (*)    All other components within normal limits  I-STAT ARTERIAL BLOOD GAS, ED - Abnormal; Notable for the following components:   Bicarbonate 19.8 (*)    TCO2 21 (*)    Acid-base deficit 4.0 (*)    Potassium 3.1 (*)    All other components within normal limits  TROPONIN I (HIGH SENSITIVITY) - Abnormal; Notable for the following components:   Troponin I (High Sensitivity) 39 (*)    All other components within normal limits  SARS CORONAVIRUS 2 BY RT PCR  CULTURE, BLOOD (ROUTINE X 2)  CULTURE, BLOOD (ROUTINE X 2)  RESPIRATORY PANEL BY PCR  CULTURE, BLOOD (ROUTINE X 2)  CULTURE, BLOOD (ROUTINE X 2)  MRSA NEXT GEN BY PCR, NASAL  LIPASE, BLOOD  PROTIME-INR  PROCALCITONIN  LACTIC ACID, PLASMA  LACTIC ACID, PLASMA  CREATININE, SERUM  SEDIMENTATION RATE  C-REACTIVE PROTEIN  RAPID URINE DRUG SCREEN, HOSP PERFORMED  STREP PNEUMONIAE URINARY ANTIGEN  LEGIONELLA PNEUMOPHILA SEROGP 1 UR AG  BLOOD GAS, ARTERIAL  BASIC METABOLIC PANEL  CBC  MAGNESIUM  PHOSPHORUS  BRAIN NATRIURETIC PEPTIDE  TROPONIN I (HIGH SENSITIVITY)    EKG EKG Interpretation  Date/Time:  Thursday Jul 10 2022 12:47:44 EDT Ventricular Rate:  105 PR Interval:  175 QRS Duration: 103 QT Interval:  340 QTC Calculation: 450 R Axis:   -38 Text Interpretation: Sinus tachycardia Probable left atrial enlargement Left ventricular hypertrophy Anterior infarct, old No significant change was found Confirmed by Glynn Octave [phone]) on 07/10/2022 12:53:29 PM  Radiology DG Chest Portable 1 View  Result Date: 07/10/2022 CLINICAL DATA:  Shortness of breath EXAM: PORTABLE CHEST 1 VIEW COMPARISON:  CTA chest 07/16/21, CXR 08/08/21 FINDINGS: Redemonstrated are extensive  bilateral hazy airspace opacities appear unchanged compared to 07/16/2021. Cardiomegaly. No radiographically apparent displaced rib fractures. Visualized upper abdomen is unremarkable. Loop recorder device projects over the left hemithorax. Visualized upper abdomen is unremarkable. IMPRESSION: 1. No significant interval change in extensive bilateral hazy airspace opacities compared to 07/16/2021. 2. Cardiomegaly. 3. No radiographically apparent displaced rib fractures. Electronically Signed   By: Lorenza Cambridge M.D.   On: 07/10/2022 13:10    Procedures .Critical Care  Performed by: Glynn Octave, MD Authorized by: Glynn Octave, MD   Critical care provider statement:    Critical care time (minutes):  45   Critical care time was exclusive of:  Separately billable procedures and treating other patients   Critical care was necessary to treat or prevent imminent or life-threatening deterioration of the following conditions:  Respiratory failure   Critical care was time spent personally by me on the following activities:  Development of treatment plan with patient or surrogate, discussions with consultants, evaluation of patient's response to treatment, examination of patient, ordering and review of laboratory studies, ordering and review of radiographic studies, ordering and performing treatments and interventions, pulse oximetry, re-evaluation of patient's condition, review of old charts, blood draw for specimens and obtaining history from patient or surrogate   I assumed direction of critical care for this patient from another provider in my specialty: no     Care discussed with: admitting provider       Medications Ordered in ED Medications  ipratropium-albuterol (DUONEB) 0.5-2.5 (3) MG/3ML nebulizer solution 3 mL (has no administration in time range)  magnesium sulfate IVPB 2 g 50 mL (has no administration in time range)  furosemide (LASIX) injection 40 mg (has no administration in time  range)  ED Course/ Medical Decision Making/ A&P                             Medical Decision Making Amount and/or Complexity of Data Reviewed Independent Historian: EMS Labs: ordered. Decision-making details documented in ED Course. Radiology: ordered and independent interpretation performed. Decision-making details documented in ED Course. ECG/medicine tests: ordered and independent interpretation performed. Decision-making details documented in ED Course.  Risk Prescription drug management. Decision regarding hospitalization.   Patient home with severe difficulty breathing and hypoxia since this morning.  On arrival on CPAP he was saturating 65% with diminished breath sounds.  Transition to BiPAP with gradual improvement in O2 saturation to the 80s.  He is given bronchodilators, steroids and magnesium.  Will give IV Lasix empirically as well.  EKG is sinus rhythm without acute ST elevation  ABG and work of breathing improving on BiPAP.  Labs show leukocytosis of 19 likely secondary to steroid use. Hyperglycemia with anion gap of 20 concerning for possible DKA. pH is normal.  ABG without significant CO2 retention  X-ray shows stable interstitial opacities.  ABG does not show significant CO2 retention.  Suspects no symptoms secondary to diastolic heart failure exacerbation.  Will rule out pulmonary embolism.  Given degree of hypoxia, d/w internal medicine residents who agree with high PEEP and fiO2 needs, would benefit from ICU admission. High risk of needing intubation and decompensation.   CTPE pending at time of admission. Will attempt to transition to HFNC O2.  D/w critical care team.        Final Clinical Impression(s) / ED Diagnoses Final diagnoses:  Acute respiratory failure with hypoxia [facility])    Rx / DC Orders ED Discharge Orders     None         Lainie Daubert, Jeannett Senior, MD 07/10/22 1720

## 2022-07-10 NOTE — ED Notes (Signed)
Got patient into a gown on the monitor did EKG shown to Dr Manus Gunning patient is resting with nurse at bedside

## 2022-07-10 NOTE — H&P (Signed)
See PCCM consult note dated 07/10/22  Steffanie Dunn, DO 07/10/22 4:51 PM [facility]  For contact information, see Amion. If no response to pager, please call PCCM consult pager. After hours, 7PM- 7AM, please call Elink.

## 2022-07-10 NOTE — Progress Notes (Addendum)
eLink Physician-Brief Progress Note Patient Name: Zachary Briggs DOB: 07/06/1955 MRN: [id number]   Date of Service  07/10/2022  HPI/Events of Note  67 year old gentleman with a history of cocaine abuse, prediabetes, prostate cancer, AS who presented with shortness of breath since last evening.  Appears to have ketoacidosis, scheduled insulin drip but has been hypokalemic receiving multiple runs of potassium.  Still anticipate that the patient will need insulin drip.  eICU Interventions  Will order serial BMPs starting at 9 PM.  Beta-hydroxybutyrate with morning labs.   0019 -  requesting sleep aide, unable to tolerate oral intake.  Will attempt olanzapine. BMP shows gap is closed bicarb 20, glucose 188.  Will transition from insulin drip to insulin sliding scale.   Intervention Category Minor Interventions: Clinical assessment - ordering diagnostic tests  Leshia Kope 07/10/2022, 7:36 PM

## 2022-07-10 NOTE — Progress Notes (Signed)
Pt taken off BiPAP and placed on Heated HFNC 40L 100%. Pt tolerating well, vitals stable, SpO2 94%,CCM aware, RT will monitor as needed.     07/10/22 1638  Therapy Vitals  Pulse Rate 92  Resp (!) 28  MEWS Score/Color  MEWS Score 2  MEWS Score Color Yellow  Respiratory Assessment  Assessment Type Assess only  Respiratory Pattern Regular;Unlabored  Chest Assessment Chest expansion symmetrical  Cough Non-productive  Bilateral Breath Sounds Diminished  Oxygen Therapy/Pulse Ox  O2 Device HFNC  $ High Flow Nasal Cannula  Yes  O2 Therapy Oxygen humidified  Heater temperature 93.2 F (34 C)  O2 Flow Rate (L/min) (S)  40 L/min  FiO2 (%) 100 %  SpO2 94 %

## 2022-07-10 NOTE — Consult Note (Signed)
NAME:  Zachary Briggs, MRN:  [id number], DOB:  05-19-1955, LOS: 0 ADMISSION DATE:  07/10/2022, CONSULTATION DATE:  5/2 REFERRING MD:  Ranc[place], CHIEF COMPLAINT:  respiratory failure   History of Present Illness:  This is a 67 year old male patient with history as mentioned below.  He is service-connected, retired from [employer].  Has a significant history of COPD, as well as diastolic heart failure, aortic valve stenosis, and polysubstance abuse including cocaine nicotine and alcohol.  States he was in his usual state of health up until 5/1 when he began to develop rather acute onset of shortness of breath and cough.  He describes the cough is productive of clear sputum.  He denied sick exposure, denied chest pain, denies nasal congestion, sore throat, wheezing.  Stopped smoking this a.m. on admission 5/2, last smoked cocaine on 5/1.  Patient called EMS today, on EMS arrival pulse oximetry 35% on room air placed on CPAP only improving to 65%.  En route he was given IV Solu-Medrol, IV magnesium, supplemental oxygen.  On presentation chest x-ray was obtained shows bilateral airspace disease with more dense consolidation in the right base.  Placed on NIPPV requiring escalation of PEEP up to 10 to maintain pulse oximetry greater than 90%.  Critical care asked admit given respiratory distress  Pertinent  Medical History  Aortic valve stenosis, BPH, cannabis dependence, prostate cancer, cocaine dependence, essential hypertension, GERD, headache, hepatitis C, back pain, mixed anxiety and depression disorder, heart murmur, nicotine dependence, nonsustained VT, SVT, history of pulmonary nodule, grade 1 diastolic dysfunction   Significant Hospital Events: Including procedures, antibiotic start and stop dates in addition to other pertinent events   5/2 admitted with acute respiratory failure secondary to mixed picture of acute decompensated heart failure and pneumonia versus perhaps drug-induced pneumonitis.   Started on noninvasive positive pressure ventilation, IV ceftriaxone, azithromycin, and hydrocortisone admitted to ICU  Interim History / Subjective:  Feels a little better on NIPPV.  Reports he is thirsty wants something to eat  Objective   Blood pressure 122/81, pulse 94, temperature 99.5 F (37.5 C), temperature source Axillary, resp. rate (Abnormal) 29, SpO2 92 %.    FiO2 (%):  [60 %] 60 %  No intake or output data in the 24 hours ending 07/10/22 1426 There were no vitals filed for this visit.  Examination: General: 67 year old male patient currently on NIPPV not in acute distress but does exhibit accessory muscle use HENT: Pupils equal reactive no clear JVD BiPAP mask excluding complete facial exam Lungs: Basilar rales with occasional rhonchi and mild accessory use Portable chest x-ray bilateral hazy airspace disease with more dense consolidation right base Cardiovascular: Regular rate and rhythm Abdomen: Distended tympanic to percussion bowel sounds are present Extremities: Warm and dry with brisk capillary refill Neuro: Awake and oriented GU: Due to void  Resolved Hospital Problem list     Assessment & Plan:  Acute hypoxic respiratory failure in the setting of bilateral pulmonary infiltrates.  Favoring mixed picture of pulmonary edema in the setting of acute systolic heart failure and community-acquired pneumonia versus potential pneumonitis given history of cocaine abuse -He is significantly PEEP dependent and requires high FiO2 necessitating intensive care admission Plan Admit to the intensive care He has already received furosemide, will follow for clinical response may need further diuresis Continue NIPPV Continuous pulse oximetry N.p.o. except meds IV ceftriaxone and azithromycin for CAP coverage Sending sed rate and CRP Hydrocortisone 100 mg every 12 x 4 days then taper Sending respiratory  viral Panel Rule out COVID Blood cultures sent Follow-up  procalcitonin Send UDS Echocardiogram Urine strep Legionella antigens CT angiogram ordered, will evaluate  Community-acquired pneumonia Plan As outlined above   Acute heart failure with pulmonary edema Elevated troponins History of hypertension, history of coronary artery disease, history of aortic stenosis Plan Continue to cycle cardiac enzymes Echocardiogram Holding beta-blockade given recent cocaine use Telemetry monitoring Holding Norvasc and Diovan for now As needed hydralazine for systolic blood pressure greater 180  History of hepatitis C Plan Contact precautions  History of chronic pain, anxiety depression and polysubstance abuse Plan Currently holding baclofen and Lyrica Continue Zoloft  History of hyperlipidemia Plan Resume Lipitor when able  Hyperglycemia Plan Sliding scale insulin    Best Practice (right click and "Reselect all SmartList Selections" daily)   Diet/type: NPO DVT prophylaxis: prophylactic heparin  GI prophylaxis: PPI Lines: N/A Foley:  N/A Code Status:  full code Last date of multidisciplinary goals of care discussion [was unable to articulate CODE STATUS at this point for now we will continue full code]  Labs   CBC: Recent Labs  Lab 07/10/22 1305 07/10/22 1323 07/10/22 1338  WBC 19.6*  --   --   NEUTROABS 16.6*  --   --   HGB 13.8 15.3 14.3  HCT 41.1 45.0 42.0  MCV 79.3*  --   --   PLT 236  --   --     Basic Metabolic Panel: Recent Labs  Lab 07/10/22 1323 07/10/22 1338  NA 138 137  K 3.2* 3.1*  CL 105  --   GLUCOSE 216*  --   BUN 16  --   CREATININE 0.90  --    GFR: CrCl cannot be calculated (Unknown ideal weight.). Recent Labs  Lab 07/10/22 1305  WBC 19.6*    Liver Function Tests: No results for input(s): "AST", "ALT", "ALKPHOS", "BILITOT", "PROT", "ALBUMIN" in the last 168 hours. No results for input(s): "LIPASE", "AMYLASE" in the last 168 hours. No results for input(s): "AMMONIA" in the last 168  hours.  ABG    Component Value Date/Time   PHART 7.385 07/10/2022 1338   PCO2ART 33.3 07/10/2022 1338   PO2ART 102 07/10/2022 1338   HCO3 19.8 (L) 07/10/2022 1338   TCO2 21 (L) 07/10/2022 1338   ACIDBASEDEF 4.0 (H) 07/10/2022 1338   O2SAT 98 07/10/2022 1338     Coagulation Profile: Recent Labs  Lab 07/10/22 1305  INR 1.2    Cardiac Enzymes: No results for input(s): "CKTOTAL", "CKMB", "CKMBINDEX", "TROPONINI" in the last 168 hours.  HbA1C: Hgb A1c MFr Bld  Date/Time Value Ref Range Status  07/16/2021 11:49 AM 6.6 (H) 4.8 - 5.6 % Final    Comment:    (NOTE) Pre diabetes:          5.7%-6.4%  Diabetes:              >6.4%  Glycemic control for   <7.0% adults with diabetes   12/28/2015 08:35 PM 5.8 (H) 4.8 - 5.6 % Final    Comment:    (NOTE)         Pre-diabetes: 5.7 - 6.4         Diabetes: >6.4         Glycemic control for adults with diabetes: <7.0     CBG: No results for input(s): "GLUCAP" in the last 168 hours.  Review of Systems:   Review of Systems  Constitutional:  Negative for chills, fever and malaise/fatigue.  HENT:  Negative  for congestion, sinus pain and sore throat.   Eyes: Negative.   Respiratory:  Positive for cough, sputum production, shortness of breath and wheezing.   Cardiovascular:  Negative for chest pain, orthopnea and leg swelling.  Gastrointestinal:  Positive for heartburn and vomiting. Negative for abdominal pain.  Genitourinary: Negative.   Musculoskeletal: Negative.   Skin: Negative.   Neurological: Negative.   Endo/Heme/Allergies: Negative.      Past Medical History:  He,  has a past medical history of Aortic atherosclerosis (HCC) (12/29/2015), Aortic stenosis (12/30/2015), Back pain, Bulla of lung (HCC) (12/29/2015), Heart murmur, Heart valve disease, Hepatitis C, Hypertension, Hypokalemia, Pneumonia, Prediabetes (12/29/2015), Prostate cancer (HCC), and Tobacco abuse.   Surgical History:   Past Surgical History:  Procedure  Laterality Date   BACK SURGERY       Social History:   reports that he has been smoking cigars. He has never used smokeless tobacco. He reports current alcohol use of about 4.0 standard drinks of alcohol per week. He reports current drug use. Drug: Marijuana.   Family History:  His family history includes Cancer in his father, mother, and sister; Diabetes Mellitus II in his brother.   Allergies No Known Allergies   Home Medications  Prior to Admission medications   Medication Sig Start Date End Date Taking? Authorizing Provider  acetaminophen (TYLENOL) 325 MG tablet Take 650 mg by mouth every 4 (four) hours as needed for mild pain or fever.    [provider]  amLODipine (NORVASC) 10 MG tablet Take 1 tablet (10 mg total) by mouth daily. 08/01/21 08/31/21  Gwenevere Abbot, MD  atorvastatin (LIPITOR) 40 MG tablet Take 1 tablet (40 mg total) by mouth daily. 08/01/21 08/31/21  Gwenevere Abbot, MD  baclofen (LIORESAL) 10 MG tablet Take 1 tablet (10 mg total) by mouth every 8 (eight) hours as needed for muscle spasms. 08/25/21   Zenia Resides, MD  carvedilol (COREG) 25 MG tablet Take 1 tablet (25 mg total) by mouth 2 (two) times daily with a meal. 07/31/21 08/30/21  Gwenevere Abbot, MD  ibuprofen (ADVIL) 600 MG tablet Take 1 tablet (600 mg total) by mouth every 8 (eight) hours as needed (pain). 08/25/21   Zenia Resides, MD  omeprazole (PRILOSEC) 20 MG capsule Take 1 capsule (20 mg total) by mouth daily. 07/31/21 08/30/21  Gwenevere Abbot, MD  pregabalin (LYRICA) 200 MG capsule Take 1 capsule (200 mg total) by mouth 2 (two) times daily. 07/31/21 08/30/21  Gwenevere Abbot, MD  sertraline (ZOLOFT) 50 MG tablet Take 3 tablets (150 mg total) by mouth daily. 08/01/21 08/31/21  Gwenevere Abbot, MD  valsartan (DIOVAN) 80 MG tablet Take 1 tablet (80 mg total) by mouth in the morning, at noon, and at bedtime. 07/31/21 08/30/21  Gwenevere Abbot, MD     Critical care time: 45 min     Simonne Martinet ACNP-BC [facility] Pager # [phone] OR # [phone] if no answer

## 2022-07-11 ENCOUNTER — Inpatient Hospital Stay (HOSPITAL_COMMUNITY): Payer: Medicare HMO

## 2022-07-11 DIAGNOSIS — J9601 Acute respiratory failure with hypoxia: Secondary | ICD-10-CM | POA: Diagnosis not present

## 2022-07-11 DIAGNOSIS — I5033 Acute on chronic diastolic (congestive) heart failure: Secondary | ICD-10-CM

## 2022-07-11 DIAGNOSIS — R0603 Acute respiratory distress: Secondary | ICD-10-CM | POA: Diagnosis not present

## 2022-07-11 DIAGNOSIS — F1490 Cocaine use, unspecified, uncomplicated: Secondary | ICD-10-CM

## 2022-07-11 LAB — GLUCOSE, CAPILLARY
Glucose-Capillary: 175 mg/dL — ABNORMAL HIGH (ref 70–99)
Glucose-Capillary: 153 mg/dL — ABNORMAL HIGH (ref 70–99)
Glucose-Capillary: 133 mg/dL — ABNORMAL HIGH (ref 70–99)
Glucose-Capillary: 159 mg/dL — ABNORMAL HIGH (ref 70–99)
Glucose-Capillary: 182 mg/dL — ABNORMAL HIGH (ref 70–99)
Glucose-Capillary: 217 mg/dL — ABNORMAL HIGH (ref 70–99)

## 2022-07-11 LAB — ECHOCARDIOGRAM COMPLETE
AR max vel: 3.82 cm2
AV Area VTI: 4.04 cm2
AV Area mean vel: 3.64 cm2
AV Mean grad: 3 mmHg
AV Peak grad: 4.7 mmHg
Ao pk vel: 1.08 m/s
Area-P 1/2: 5.02 cm2
Calc EF: 53.7 %
Height: 65 in
S' Lateral: 3 cm
Single Plane A2C EF: 59.4 %
Single Plane A4C EF: 43.8 %
Weight: 2987.67 oz

## 2022-07-11 LAB — BASIC METABOLIC PANEL
Anion gap: 12 (ref 5–15)
Anion gap: 12 (ref 5–15)
Anion gap: 13 (ref 5–15)
Anion gap: 14 (ref 5–15)
BUN: 16 mg/dL (ref 8–23)
BUN: 17 mg/dL (ref 8–23)
BUN: 19 mg/dL (ref 8–23)
BUN: 21 mg/dL (ref 8–23)
CO2: 20 mmol/L — ABNORMAL LOW (ref 22–32)
CO2: 20 mmol/L — ABNORMAL LOW (ref 22–32)
CO2: 21 mmol/L — ABNORMAL LOW (ref 22–32)
CO2: 21 mmol/L — ABNORMAL LOW (ref 22–32)
Calcium: 8.3 mg/dL — ABNORMAL LOW (ref 8.9–10.3)
Calcium: 8.4 mg/dL — ABNORMAL LOW (ref 8.9–10.3)
Calcium: 8.5 mg/dL — ABNORMAL LOW (ref 8.9–10.3)
Calcium: 8.5 mg/dL — ABNORMAL LOW (ref 8.9–10.3)
Chloride: 105 mmol/L (ref 98–111)
Chloride: 107 mmol/L (ref 98–111)
Chloride: 105 mmol/L (ref 98–111)
Chloride: 104 mmol/L (ref 98–111)
Creatinine, Ser: 1.06 mg/dL (ref 0.61–1.24)
Creatinine, Ser: 1.1 mg/dL (ref 0.61–1.24)
Creatinine, Ser: 1.16 mg/dL (ref 0.61–1.24)
Creatinine, Ser: 1.02 mg/dL (ref 0.61–1.24)
GFR, Estimated: >60 mL/min (ref >60)
GFR, Estimated: >60 mL/min (ref >60)
GFR, Estimated: >60 mL/min (ref >60)
GFR, Estimated: >60 mL/min (ref >60)
Glucose, Bld: 188 mg/dL — ABNORMAL HIGH (ref 70–99)
Glucose, Bld: 185 mg/dL — ABNORMAL HIGH (ref 70–99)
Glucose, Bld: 160 mg/dL — ABNORMAL HIGH (ref 70–99)
Glucose, Bld: 146 mg/dL — ABNORMAL HIGH (ref 70–99)
Potassium: 3.6 mmol/L (ref 3.5–5.1)
Potassium: 3.6 mmol/L (ref 3.5–5.1)
Potassium: 3.5 mmol/L (ref 3.5–5.1)
Potassium: 3.6 mmol/L (ref 3.5–5.1)
Sodium: 137 mmol/L (ref 135–145)
Sodium: 139 mmol/L (ref 135–145)
Sodium: 139 mmol/L (ref 135–145)
Sodium: 139 mmol/L (ref 135–145)

## 2022-07-11 LAB — CBC
HCT: 39.7 % (ref 39.0–52.0)
Hemoglobin: 12.8 g/dL — ABNORMAL LOW (ref 13.0–17.0)
MCH: 25.7 pg — ABNORMAL LOW (ref 26.0–34.0)
MCHC: 32.2 g/dL (ref 30.0–36.0)
MCV: 79.7 fL — ABNORMAL LOW (ref 80.0–100.0)
Platelets: 205 10*3/uL (ref 150–400)
RBC: 4.98 MIL/uL (ref 4.22–5.81)
RDW: 16.7 % — ABNORMAL HIGH (ref 11.5–15.5)
WBC: 16.9 10*3/uL — ABNORMAL HIGH (ref 4.0–10.5)
nRBC: 0 % (ref 0.0–0.2)

## 2022-07-11 LAB — CBC WITH DIFFERENTIAL/PLATELET
Abs Immature Granulocytes: 0.12 10*3/uL — ABNORMAL HIGH (ref 0.00–0.07)
Basophils Absolute: 0 10*3/uL (ref 0.0–0.1)
Basophils Relative: 0 %
Eosinophils Absolute: 0 10*3/uL (ref 0.0–0.5)
Eosinophils Relative: 0 %
HCT: 40.9 % (ref 39.0–52.0)
Hemoglobin: 13.4 g/dL (ref 13.0–17.0)
Immature Granulocytes: 1 %
Lymphocytes Relative: 6 %
Lymphs Abs: 1.1 10*3/uL (ref 0.7–4.0)
MCH: 26.1 pg (ref 26.0–34.0)
MCHC: 32.8 g/dL (ref 30.0–36.0)
MCV: 79.6 fL — ABNORMAL LOW (ref 80.0–100.0)
Monocytes Absolute: 0.8 10*3/uL (ref 0.1–1.0)
Monocytes Relative: 4 %
Neutro Abs: 16.9 10*3/uL — ABNORMAL HIGH (ref 1.7–7.7)
Neutrophils Relative %: 89 %
Platelets: 225 10*3/uL (ref 150–400)
RBC: 5.14 MIL/uL (ref 4.22–5.81)
RDW: 16.9 % — ABNORMAL HIGH (ref 11.5–15.5)
WBC: 18.8 10*3/uL — ABNORMAL HIGH (ref 4.0–10.5)
nRBC: 0 % (ref 0.0–0.2)

## 2022-07-11 LAB — CULTURE, BLOOD (ROUTINE X 2)
Culture: NO GROWTH
Special Requests: ADEQUATE

## 2022-07-11 LAB — PHOSPHORUS: Phosphorus: 2.9 mg/dL (ref 2.5–4.6)

## 2022-07-11 LAB — BRAIN NATRIURETIC PEPTIDE: B Natriuretic Peptide: 235.3 pg/mL — ABNORMAL HIGH (ref 0.0–100.0)

## 2022-07-11 LAB — MAGNESIUM: Magnesium: 2.6 mg/dL — ABNORMAL HIGH (ref 1.7–2.4)

## 2022-07-11 LAB — BETA-HYDROXYBUTYRIC ACID: Beta-Hydroxybutyric Acid: 0.45 mmol/L — ABNORMAL HIGH (ref 0.05–0.27)

## 2022-07-11 MED ORDER — POTASSIUM CHLORIDE 10 MEQ/100ML IV SOLN
10.0000 meq | INTRAVENOUS | Status: AC
  Administered 2022-07-11 (×4): 10 meq via INTRAVENOUS
  Filled 2022-07-11 (×4): qty 100

## 2022-07-11 MED ORDER — OLANZAPINE 10 MG IM SOLR
5.0000 mg | Freq: Once | INTRAMUSCULAR | Status: AC | PRN
  Administered 2022-07-11: 5 mg via INTRAVENOUS
  Filled 2022-07-11: qty 10

## 2022-07-11 MED ORDER — METHYLPREDNISOLONE SODIUM SUCC 125 MG IJ SOLR
80.0000 mg | Freq: Every day | INTRAMUSCULAR | Status: DC
  Administered 2022-07-11 – 2022-07-13 (×3): 80 mg via INTRAVENOUS
  Filled 2022-07-11 (×3): qty 2

## 2022-07-11 MED ORDER — OLANZAPINE 10 MG IM SOLR
5.0000 mg | Freq: Every evening | INTRAMUSCULAR | Status: DC | PRN
  Administered 2022-07-11 – 2022-07-13 (×2): 5 mg via INTRAVENOUS
  Filled 2022-07-11 (×3): qty 10

## 2022-07-11 MED ORDER — STERILE WATER FOR INJECTION IJ SOLN
INTRAMUSCULAR | Status: AC
  Administered 2022-07-11: 10 mL
  Filled 2022-07-11: qty 10

## 2022-07-11 MED ORDER — FUROSEMIDE 10 MG/ML IJ SOLN
40.0000 mg | Freq: Once | INTRAMUSCULAR | Status: AC
  Administered 2022-07-11: 40 mg via INTRAVENOUS

## 2022-07-11 MED ORDER — INSULIN ASPART 100 UNIT/ML IJ SOLN
0.0000 [IU] | INTRAMUSCULAR | Status: DC
  Administered 2022-07-11 (×5): 3 [IU] via SUBCUTANEOUS
  Administered 2022-07-11: 2 [IU] via SUBCUTANEOUS
  Administered 2022-07-12: 5 [IU] via SUBCUTANEOUS

## 2022-07-11 NOTE — Progress Notes (Signed)
[facility] ADULT ICU REPLACEMENT PROTOCOL   The patient does apply for the [facility] Adult ICU Electrolyte Replacment Protocol based on the criteria listed below:   1.Exclusion criteria: TCTS, ECMO, Dialysis, and Myasthenia Gravis patients 2. Is GFR >/= 30 ml/min? Yes.    Patient's GFR today is >60 3. Is SCr </= 2? Yes.   Patient's SCr is 1.10 mg/dL 4. Did SCr increase >/= 0.5 in 24 hours? No. 5.Pt's weight >40kg  Yes.   6. Abnormal electrolyte(s):   K 3.6  7. Electrolytes replaced per protocol 8.  Call MD STAT for K+ </= 2.5, Phos </= 1, or Mag </= 1 Physician:  A. Florestine Avers R Damon Hargrove 07/11/2022 4:26 AM

## 2022-07-11 NOTE — Progress Notes (Signed)
   07/11/22 2003  Vent Select  Invasive or Noninvasive Noninvasive  Adult Vent Y  Adult Ventilator Settings  Vent Type Servo i  Humidity HME  Vent Mode PCV;BIPAP  Set Rate 18 bmp  FiO2 (%) 70 %  Pressure Control 12 cmH20  PEEP 8 cmH20  Adult Ventilator Measurements  Peak Airway Pressure 28 L/min  Mean Airway Pressure 9 cmH20  Resp Rate Spontaneous 11 br/min  Resp Rate Total 28 br/min  Exhaled Vt 1321 mL  Measured Ve 30.9 L  I:E Ratio Measured 1:1.1  Auto PEEP 0 cmH20  Total PEEP 8 cmH20  Adult Ventilator Alarms  Alarms On Y  Ve High Alarm 35 L/min  Ve Low Alarm 6 L/min  Resp Rate High Alarm 40 br/min  Resp Rate Low Alarm 8  PEEP Low Alarm 5 cmH2O  Press High Alarm 40 cmH2O  T Apnea 20 sec(s)  VAP Prevention  HOB> 30 Degrees Y  Breath Sounds  Bilateral Breath Sounds Diminished  Vent Respiratory Assessment  Respiratory Pattern Regular;Unlabored   Placed pt. On bipap due to pt dropping sats to low 80s HFNC on standby

## 2022-07-11 NOTE — Progress Notes (Signed)
   07/11/22 2303  BiPAP/CPAP/SIPAP  BiPAP/CPAP/SIPAP Pt Type Adult  BiPAP/CPAP/SIPAP SERVO  Mask Type Full face mask  Mask Size Medium  Set Rate 18 breaths/min  Respiratory Rate 27 breaths/min  PEEP 8 cmH20  FiO2 (%) 100 %  BiPAP/CPAP /SiPAP Vitals  Pulse Rate 93  Resp 17  SpO2 99 %  MEWS Score/Color  MEWS Score 0  MEWS Score Color Green   Placed pt. Back on bipap

## 2022-07-11 NOTE — Progress Notes (Signed)
  Echocardiogram 2D Echocardiogram has been performed.  Zachary Briggs 07/11/2022, 11:39 AM

## 2022-07-11 NOTE — Progress Notes (Signed)
   07/11/22 2229  Therapy Vitals  Pulse Rate 96  Resp (!) 25  Patient Position (if appropriate) Lying  MEWS Score/Color  MEWS Score 1  MEWS Score Color Green  Respiratory Assessment  Assessment Type Assess only  Respiratory Pattern Regular;Unlabored  Chest Assessment Chest expansion symmetrical  Oxygen Therapy/Pulse Ox  O2 Device HFNC;Non-rebreather Mask  O2 Therapy Oxygen humidified  O2 Flow Rate (L/min) 75 L/min  FiO2 (%) 100 %  SpO2 99 %   Placed pt. Back on HFNC and NRB due to him not tolerating and refusing the bipap.

## 2022-07-11 NOTE — Progress Notes (Addendum)
eLink Physician-Brief Progress Note Patient Name: Zachary Briggs DOB: [birth year] MRN: [id number]   Date of Service  07/11/2022  HPI/Events of Note  67 year old gentleman with a history of cocaine abuse, prediabetes, prostate cancer, AS who presented with shortness of breath.  Remains on BiPAP, ongoing agitation  eICU Interventions  Zyprexa worked well last night, will try that again   2020 -patient is quite angry about having to wear BiPAP although he has been hypoxic on high flow nasal cannula with supplemental nonrebreather.  He wishes to leave AGAINST MEDICAL ADVICE.  He is alert and oriented and recognizes the risk of potential death if he were to leave without adequate oxygen supplementation.  Despite this, he would like to leave.  I requested ground team evaluation given it is difficult to communicate with him with his current supplementation.    Intervention Category Minor Interventions: Agitation / anxiety - evaluation and management  Aspin Palomarez 07/11/2022, 9:43 PM

## 2022-07-11 NOTE — Progress Notes (Signed)
NAME:  Zachary Briggs, MRN:  [id number], DOB:  1955-07-04, LOS: 1 ADMISSION DATE:  07/10/2022, CONSULTATION DATE:  5/2 REFERRING MD:  Rancour, CHIEF COMPLAINT:  respiratory failure   History of Present Illness:  This is a 67 year old male patient with history as mentioned below.  He is service-connected, retired from [employer].  Has a significant history of COPD, as well as diastolic heart failure, aortic valve stenosis, and polysubstance abuse including cocaine nicotine and alcohol.  States he was in his usual state of health up until 5/1 when he began to develop rather acute onset of shortness of breath and cough.  He describes the cough is productive of clear sputum.  He denied sick exposure, denied chest pain, denies nasal congestion, sore throat, wheezing.  Stopped smoking this a.m. on admission 5/2, last smoked cocaine on 5/1.  Patient called EMS today, on EMS arrival pulse oximetry 35% on room air placed on CPAP only improving to 65%.  En route he was given IV Solu-Medrol, IV magnesium, supplemental oxygen.  On presentation chest x-ray was obtained shows bilateral airspace disease with more dense consolidation in the right base.  Placed on NIPPV requiring escalation of PEEP up to 10 to maintain pulse oximetry greater than 90%.  Critical care asked admit given respiratory distress  Pertinent  Medical History  Aortic valve stenosis, BPH, cannabis dependence, prostate cancer, cocaine dependence, essential hypertension, GERD, headache, hepatitis C, back pain, mixed anxiety and depression disorder, heart murmur, nicotine dependence, nonsustained VT, SVT, history of pulmonary nodule, grade 1 diastolic dysfunction   Significant Hospital Events: Including procedures, antibiotic start and stop dates in addition to other pertinent events   5/2 admitted with acute respiratory failure secondary to mixed picture of acute decompensated heart failure and pneumonia versus perhaps drug-induced pneumonitis.   Started on noninvasive positive pressure ventilation, IV ceftriaxone, azithromycin, and hydrocortisone admitted to ICU  5/3 variable NPPV compliance. Weaning FiO2 on BiPAP   Interim History / Subjective:   Weaned his FiO2 this morning   C/o being thirsty   Objective   Blood pressure (!) 159/86, pulse 89, temperature 98.3 F (36.8 C), temperature source Axillary, resp. rate (!) 28, height 5\' 5"  (1.651 m), weight 84.7 kg, SpO2 90 %.    Vent Mode: BIPAP;PCV FiO2 (%):  [50 %-100 %] 60 % Set Rate:  [18 bmp] 18 bmp PEEP:  [8 cmH20-10 cmH20] 8 cmH20   Intake/Output Summary (Last 24 hours) at 07/11/2022 0932 Last data filed at 07/11/2022 0600 Gross per 24 hour  Intake 897.8 ml  Output 600 ml  Net 297.8 ml   Filed Weights   07/10/22 1625 07/11/22 0356  Weight: 84.5 kg 84.7 kg    Examination: General: chronically and acutely ill middle aged M on BiPAP NAD HENT: NCAT. BiPAP with good seal  Lungs: Symmetrical chest expansion, Bilat crackles  Cardiovascular: s1s2 no rgm  Extremities: no acute joint deformity  Neuro: AAO following commands  GU: foley   Resolved Hospital Problem list     Assessment & Plan:   Acute hypoxic respiratory failure Pulmonary edema CAP Possible pneumonitis  Emphysema  -ongoing crack/cocaine smoker  -interval improvement in CXR 5/2-5/3 after lasix makes it likely that there is edema component -sed rate 54 Plan -diurese  -add on a diff  -CAP coverage  -steroids  -f/u strep pneumo, legionella  -hopefully come off BiPAP to HHFNC  SpO2 goal > 88   Acute on chronic diastolic HF  Moderate AS  HTN HLD Plan -  ECHO -diuresing  -PRN hydral SBP goal < 180   Hypokalemia Hypermagnesemia  Plan -Replace K  Polysubstance abuse Chronic pain Anxiety  Plan -UDS still pending, he endorses ongoing crack/cocaine smoking several times/wk  -cessation support   Hyperglycemia Plan -SSI   Hx Hep C  Plan -no acute intervention   Best Practice (right  click and "Reselect all SmartList Selections" daily)   Diet/type: NPO DVT prophylaxis: prophylactic heparin  GI prophylaxis: PPI Lines: N/A Foley:  N/A Code Status:  full code Last date of multidisciplinary goals of care discussion [was unable to articulate CODE STATUS at this point for now we will continue full code]  Labs   CBC: Recent Labs  Lab 07/10/22 1305 07/10/22 1323 07/10/22 1338 07/10/22 1448 07/10/22 2007 07/11/22 0226  WBC 19.6*  --   --  18.5*  --  16.9*  NEUTROABS 16.6*  --   --   --   --   --   HGB 13.8 15.3 14.3 14.7 14.3 12.8*  HCT 41.1 45.0 42.0 44.6 42.0 39.7  MCV 79.3*  --   --  79.5*  --  79.7*  PLT 236  --   --  241  --  205    Basic Metabolic Panel: Recent Labs  Lab 07/10/22 1305 07/10/22 1323 07/10/22 1338 07/10/22 1448 07/10/22 1937 07/10/22 2007 07/10/22 2317 07/11/22 0226 07/11/22 0659  NA 136 138   < >  --  137 138 137 139 139  K 3.2* 3.2*   < >  --  3.5 3.6 3.6 3.6 3.5  CL 100 105  --   --  103  --  105 107 105  CO2 16*  --   --   --  19*  --  20* 20* 21*  GLUCOSE 210* 216*  --   --  191*  --  188* 185* 160*  BUN 15 16  --   --  15  --  16 17 19   CREATININE 1.20 0.90  --  1.19 1.08  --  1.06 1.10 1.16  CALCIUM 8.9  --   --   --  8.4*  --  8.3* 8.4* 8.5*  MG  --   --   --   --   --   --   --  2.6*  --   PHOS  --   --   --   --   --   --   --  2.9  --    < > = values in this interval not displayed.   GFR: Estimated Creatinine Clearance: 62.7 mL/min (by C-G formula based on SCr of 1.16 mg/dL). Recent Labs  Lab 07/10/22 1305 07/10/22 1448 07/10/22 1545 07/10/22 1939 07/11/22 0226  PROCALCITON  --  1.09  --   --   --   WBC 19.6* 18.5*  --   --  16.9*  LATICACIDVEN  --   --  1.4 1.3  --     Liver Function Tests: Recent Labs  Lab 07/10/22 1305  AST 32  ALT 30  ALKPHOS 123  BILITOT 1.2  PROT 7.4  ALBUMIN 3.1*   Recent Labs  Lab 07/10/22 1305  LIPASE 22   No results for input(s): "AMMONIA" in the last 168  hours.  ABG    Component Value Date/Time   PHART 7.404 07/10/2022 2007   PCO2ART 31.2 (L) 07/10/2022 2007   PO2ART 97 07/10/2022 2007   HCO3 19.5 (L) 07/10/2022 2007   TCO2 20 (  L) 07/10/2022 2007   ACIDBASEDEF 4.0 (H) 07/10/2022 2007   O2SAT 98 07/10/2022 2007     Coagulation Profile: Recent Labs  Lab 07/10/22 1305  INR 1.2    Cardiac Enzymes: No results for input(s): "CKTOTAL", "CKMB", "CKMBINDEX", "TROPONINI" in the last 168 hours.  HbA1C: Hgb A1c MFr Bld  Date/Time Value Ref Range Status  07/10/2022 02:51 PM 6.4 (H) 4.8 - 5.6 % Final    Comment:    (NOTE) Pre diabetes:          5.7%-6.4%  Diabetes:              >6.4%  Glycemic control for   <7.0% adults with diabetes   07/16/2021 11:49 AM 6.6 (H) 4.8 - 5.6 % Final    Comment:    (NOTE) Pre diabetes:          5.7%-6.4%  Diabetes:              >6.4%  Glycemic control for   <7.0% adults with diabetes     CBG: Recent Labs  Lab 07/10/22 1626 07/10/22 1918 07/10/22 2335 07/11/22 0329 07/11/22 0750  GLUCAP 176* 198* 183* 175* 153*    CRITICAL CARE Performed by: Lanier Clam   Total critical care time: 39 minutes  Critical care time was exclusive of separately billable procedures and treating other patients. Critical care was necessary to treat or prevent imminent or life-threatening deterioration.  Critical care was time spent personally by me on the following activities: development of treatment plan with patient and/or surrogate as well as nursing, discussions with consultants, evaluation of patient's response to treatment, examination of patient, obtaining history from patient or surrogate, ordering and performing treatments and interventions, ordering and review of laboratory studies, ordering and review of radiographic studies, pulse oximetry and re-evaluation of patient's condition.  Tessie Fass MSN, AGACNP-BC [facility] Pulmonary/Critical Care Medicine Amion for pager  07/11/2022, 9:32  AM

## 2022-07-12 DIAGNOSIS — I5033 Acute on chronic diastolic (congestive) heart failure: Secondary | ICD-10-CM | POA: Diagnosis not present

## 2022-07-12 DIAGNOSIS — J9601 Acute respiratory failure with hypoxia: Secondary | ICD-10-CM | POA: Diagnosis not present

## 2022-07-12 DIAGNOSIS — E876 Hypokalemia: Secondary | ICD-10-CM

## 2022-07-12 LAB — BASIC METABOLIC PANEL
Anion gap: 13 (ref 5–15)
BUN: 25 mg/dL — ABNORMAL HIGH (ref 8–23)
CO2: 21 mmol/L — ABNORMAL LOW (ref 22–32)
Calcium: 8.2 mg/dL — ABNORMAL LOW (ref 8.9–10.3)
Chloride: 104 mmol/L (ref 98–111)
Creatinine, Ser: 1 mg/dL (ref 0.61–1.24)
GFR, Estimated: >60 mL/min (ref >60)
Glucose, Bld: 186 mg/dL — ABNORMAL HIGH (ref 70–99)
Potassium: 3.3 mmol/L — ABNORMAL LOW (ref 3.5–5.1)
Sodium: 138 mmol/L (ref 135–145)

## 2022-07-12 LAB — CULTURE, BLOOD (ROUTINE X 2)
Culture: NO GROWTH
Culture: NO GROWTH

## 2022-07-12 LAB — GLUCOSE, CAPILLARY
Glucose-Capillary: 120 mg/dL — ABNORMAL HIGH (ref 70–99)
Glucose-Capillary: 105 mg/dL — ABNORMAL HIGH (ref 70–99)
Glucose-Capillary: 131 mg/dL — ABNORMAL HIGH (ref 70–99)
Glucose-Capillary: 215 mg/dL — ABNORMAL HIGH (ref 70–99)
Glucose-Capillary: 146 mg/dL — ABNORMAL HIGH (ref 70–99)

## 2022-07-12 LAB — MAGNESIUM: Magnesium: 2.7 mg/dL — ABNORMAL HIGH (ref 1.7–2.4)

## 2022-07-12 LAB — CBC
HCT: 38.2 % — ABNORMAL LOW (ref 39.0–52.0)
Hemoglobin: 12.5 g/dL — ABNORMAL LOW (ref 13.0–17.0)
MCH: 25.9 pg — ABNORMAL LOW (ref 26.0–34.0)
MCHC: 32.7 g/dL (ref 30.0–36.0)
MCV: 79.3 fL — ABNORMAL LOW (ref 80.0–100.0)
Platelets: 239 10*3/uL (ref 150–400)
RBC: 4.82 MIL/uL (ref 4.22–5.81)
RDW: 16.7 % — ABNORMAL HIGH (ref 11.5–15.5)
WBC: 17.6 10*3/uL — ABNORMAL HIGH (ref 4.0–10.5)
nRBC: 0 % (ref 0.0–0.2)

## 2022-07-12 LAB — TROPONIN I (HIGH SENSITIVITY)
Troponin I (High Sensitivity): 28 ng/L — ABNORMAL HIGH (ref <18)
Troponin I (High Sensitivity): 23 ng/L — ABNORMAL HIGH (ref <18)

## 2022-07-12 MED ORDER — ATORVASTATIN CALCIUM 40 MG PO TABS
40.0000 mg | ORAL_TABLET | Freq: Every day | ORAL | Status: DC
  Administered 2022-07-12 – 2022-07-23 (×12): 40 mg via ORAL
  Filled 2022-07-12 (×12): qty 1

## 2022-07-12 MED ORDER — FUROSEMIDE 10 MG/ML IJ SOLN
40.0000 mg | Freq: Once | INTRAMUSCULAR | Status: AC
  Administered 2022-07-12: 40 mg via INTRAVENOUS

## 2022-07-12 MED ORDER — POTASSIUM CHLORIDE CRYS ER 20 MEQ PO TBCR
20.0000 meq | EXTENDED_RELEASE_TABLET | ORAL | Status: AC
  Administered 2022-07-12 (×2): 20 meq via ORAL
  Filled 2022-07-12 (×2): qty 1

## 2022-07-12 MED ORDER — FUROSEMIDE 10 MG/ML IJ SOLN
40.0000 mg | Freq: Once | INTRAMUSCULAR | Status: AC
  Administered 2022-07-12: 40 mg via INTRAVENOUS
  Filled 2022-07-12: qty 4

## 2022-07-12 MED ORDER — INSULIN ASPART 100 UNIT/ML IJ SOLN: 0.0000 [IU] | Freq: Every day | INTRAMUSCULAR | Status: DC

## 2022-07-12 MED ORDER — POTASSIUM CHLORIDE 10 MEQ/100ML IV SOLN
10.0000 meq | INTRAVENOUS | Status: AC
  Administered 2022-07-12 (×4): 10 meq via INTRAVENOUS
  Filled 2022-07-12 (×4): qty 100

## 2022-07-12 MED ORDER — AMLODIPINE BESYLATE 5 MG PO TABS
5.0000 mg | ORAL_TABLET | Freq: Every day | ORAL | Status: DC
  Administered 2022-07-12: 5 mg via ORAL
  Filled 2022-07-12: qty 1

## 2022-07-12 MED ORDER — INSULIN ASPART 100 UNIT/ML IJ SOLN
0.0000 [IU] | Freq: Three times a day (TID) | INTRAMUSCULAR | Status: DC
  Administered 2022-07-12: 2 [IU] via SUBCUTANEOUS
  Administered 2022-07-12: 5 [IU] via SUBCUTANEOUS
  Administered 2022-07-13: 3 [IU] via SUBCUTANEOUS
  Administered 2022-07-13: 2 [IU] via SUBCUTANEOUS

## 2022-07-12 MED ORDER — PANTOPRAZOLE SODIUM 40 MG PO TBEC
40.0000 mg | DELAYED_RELEASE_TABLET | Freq: Every day | ORAL | Status: DC
  Administered 2022-07-12 – 2022-07-23 (×12): 40 mg via ORAL
  Filled 2022-07-12 (×12): qty 1

## 2022-07-12 NOTE — Progress Notes (Signed)
NAME:  Zachary Briggs, MRN:  [id number], DOB:  25-Mar-1955, LOS: 2 ADMISSION DATE:  07/10/2022, CONSULTATION DATE:  5/2 REFERRING MD:  Rancour, CHIEF COMPLAINT:  respiratory failure   History of Present Illness:  This is a 67 year old male patient with history as mentioned below.  He is service-connected, retired from [employer].  Has a significant history of COPD, as well as diastolic heart failure, aortic valve stenosis, and polysubstance abuse including cocaine nicotine and alcohol.  States he was in his usual state of health up until 5/1 when he began to develop rather acute onset of shortness of breath and cough.  He describes the cough is productive of clear sputum.  He denied sick exposure, denied chest pain, denies nasal congestion, sore throat, wheezing.  Stopped smoking this a.m. on admission 5/2, last smoked cocaine on 5/1.  Patient called EMS today, on EMS arrival pulse oximetry 35% on room air placed on CPAP only improving to 65%.  En route he was given IV Solu-Medrol, IV magnesium, supplemental oxygen.  On presentation chest x-ray was obtained shows bilateral airspace disease with more dense consolidation in the right base.  Placed on NIPPV requiring escalation of PEEP up to 10 to maintain pulse oximetry greater than 90%.  Critical care asked admit given respiratory distress  Pertinent  Medical History  Aortic valve stenosis, BPH, cannabis dependence, prostate cancer, cocaine dependence, essential hypertension, GERD, headache, hepatitis C, back pain, mixed anxiety and depression disorder, heart murmur, nicotine dependence, nonsustained VT, SVT, history of pulmonary nodule, grade 1 diastolic dysfunction   Significant Hospital Events: Including procedures, antibiotic start and stop dates in addition to other pertinent events   5/2 admitted with acute respiratory failure secondary to mixed picture of acute decompensated heart failure and pneumonia versus perhaps drug-induced pneumonitis.   Started on noninvasive positive pressure ventilation, IV ceftriaxone, azithromycin, and hydrocortisone admitted to ICU  5/3 variable NPPV compliance. Weaning FiO2 on BiPAP -->  HHFNC 5/4 cont to wean FiO2   Interim History / Subjective:   Wanted to leave AMA overnight but stayed Made 1.6L UOP yesterday   Objective   Blood pressure (!) 146/89, pulse (!) 108, temperature 98.3 F (36.8 C), temperature source Oral, resp. rate (!) 38, height 5\' 5"  (1.651 m), weight 83.8 kg, SpO2 93 %.    Vent Mode: PCV;BIPAP FiO2 (%):  [50 %-100 %] 88 % Set Rate:  [18 bmp] 18 bmp PEEP:  [8 cmH20] 8 cmH20   Intake/Output Summary (Last 24 hours) at 07/12/2022 0915 Last data filed at 07/12/2022 0800 Gross per 24 hour  Intake 842.08 ml  Output 1350 ml  Net -507.92 ml   Filed Weights   07/10/22 1625 07/11/22 0356 07/12/22 0444  Weight: 84.5 kg 84.7 kg 83.8 kg    Examination: General: chronically and acutely ill M NAD  Neuro: AAOx4  HENT: NCAT pink mm  Lungs Shallow respirations, some crackles  Cardiovascular: tachy, regular s1s2  GI: soft ndnt + bowel sounds  GU: external cath  Extremities: no acute joint deformity    Resolved Hospital Problem list     Assessment & Plan:   Acute hypoxic respiratory failure Emphysema Pulmonary edema CAP vs pneumonitis  -ongoing crack/cocaine smoker  Plan -cont diuresis  -cont azithro rocephin  -solumedrol  (+ PPI) -f/u strep pneumo, legionella -Wean O2 as able goal >88% -- starting to wean HHFNC, not yet ready to transfer out of unit    AoC diastolic HF Moderate AS  HTN HLD  Plan -  diuresing  -add norvasc (reduced from home dose) still holding coreg, valsartan  -add home lipitor -PRN hydral SBP goal < 180   Hypokalemia Hypermagnesemia  Plan -replacing K  Substance abuse  Chronic pain Anxiety  -ongoing crack/cocaine smoking several times/wk  P -cessation support  -holding home gabapentin, baclofen  -cont zoloft  -PRN zyprexa    Hyperglycemia Plan -SSI   Hx Hep C  Plan -no acute intervention   Best Practice (right click and "Reselect all SmartList Selections" daily)   Diet/type: clear liquids -- possibly adv  DVT prophylaxis: prophylactic heparin  GI prophylaxis: PPI Lines: N/A Foley:  N/A Code Status:  full code Last date of multidisciplinary goals of care discussion [full]  Labs   CBC: Recent Labs  Lab 07/10/22 1305 07/10/22 1323 07/10/22 1448 07/10/22 2007 07/11/22 0226 07/11/22 1205 07/12/22 0044  WBC 19.6*  --  18.5*  --  16.9* 18.8* 17.6*  NEUTROABS 16.6*  --   --   --   --  16.9*  --   HGB 13.8   < > 14.7 14.3 12.8* 13.4 12.5*  HCT 41.1   < > 44.6 42.0 39.7 40.9 38.2*  MCV 79.3*  --  79.5*  --  79.7* 79.6* 79.3*  PLT 236  --  241  --  205 225 239   < > = values in this interval not displayed.    Basic Metabolic Panel: Recent Labs  Lab 07/10/22 2317 07/11/22 0226 07/11/22 0659 07/11/22 1205 07/12/22 0044  NA 137 139 139 139 138  K 3.6 3.6 3.5 3.6 3.3*  CL 105 107 105 104 104  CO2 20* 20* 21* 21* 21*  GLUCOSE 188* 185* 160* 146* 186*  BUN 16 17 19 21  25*  CREATININE 1.06 1.10 1.16 1.02 1.00  CALCIUM 8.3* 8.4* 8.5* 8.5* 8.2*  MG  --  2.6*  --   --  2.7*  PHOS  --  2.9  --   --   --    GFR: Estimated Creatinine Clearance: 72.4 mL/min (by C-G formula based on SCr of 1 mg/dL). Recent Labs  Lab 07/10/22 1448 07/10/22 1545 07/10/22 1939 07/11/22 0226 07/11/22 1205 07/12/22 0044  PROCALCITON 1.09  --   --   --   --   --   WBC 18.5*  --   --  16.9* 18.8* 17.6*  LATICACIDVEN  --  1.4 1.3  --   --   --     Liver Function Tests: Recent Labs  Lab 07/10/22 1305  AST 32  ALT 30  ALKPHOS 123  BILITOT 1.2  PROT 7.4  ALBUMIN 3.1*   Recent Labs  Lab 07/10/22 1305  LIPASE 22   No results for input(s): "AMMONIA" in the last 168 hours.  ABG    Component Value Date/Time   PHART 7.404 07/10/2022 2007   PCO2ART 31.2 (L) 07/10/2022 2007   PO2ART 97 07/10/2022  2007   HCO3 19.5 (L) 07/10/2022 2007   TCO2 20 (L) 07/10/2022 2007   ACIDBASEDEF 4.0 (H) 07/10/2022 2007   O2SAT 98 07/10/2022 2007     Coagulation Profile: Recent Labs  Lab 07/10/22 1305  INR 1.2    Cardiac Enzymes: No results for input(s): "CKTOTAL", "CKMB", "CKMBINDEX", "TROPONINI" in the last 168 hours.  HbA1C: Hgb A1c MFr Bld  Date/Time Value Ref Range Status  07/10/2022 02:51 PM 6.4 (H) 4.8 - 5.6 % Final    Comment:    (NOTE) Pre diabetes:  5.7%-6.4%  Diabetes:              >6.4%  Glycemic control for   <7.0% adults with diabetes   07/16/2021 11:49 AM 6.6 (H) 4.8 - 5.6 % Final    Comment:    (NOTE) Pre diabetes:          5.7%-6.4%  Diabetes:              >6.4%  Glycemic control for   <7.0% adults with diabetes     CBG: Recent Labs  Lab 07/11/22 1514 07/11/22 1930 07/11/22 2326 07/12/22 0401 07/12/22 0747  GLUCAP 159* 182* 217* 120* 105*    CRITICAL CARE Performed by: Lanier Clam   Total critical care time: 37 minutes  Critical care time was exclusive of separately billable procedures and treating other patients. Critical care was necessary to treat or prevent imminent or life-threatening deterioration.  Critical care was time spent personally by me on the following activities: development of treatment plan with patient and/or surrogate as well as nursing, discussions with consultants, evaluation of patient's response to treatment, examination of patient, obtaining history from patient or surrogate, ordering and performing treatments and interventions, ordering and review of laboratory studies, ordering and review of radiographic studies, pulse oximetry and re-evaluation of patient's condition.  Tessie Fass MSN, AGACNP-BC [facility] Pulmonary/Critical Care Medicine Amion for pager 07/12/2022, 9:15 AM

## 2022-07-12 NOTE — Progress Notes (Signed)
CPT held at this time due to pt eating. 

## 2022-07-12 NOTE — Progress Notes (Signed)
   07/12/22 0606  Oxygen Therapy/Pulse Ox  O2 Device HFNC  O2 Therapy Oxygen humidified  O2 Flow Rate (L/min) 60 L/min  FiO2 (%) 100 %  SpO2 96 %   Placed pt. On HFNC removed bipap

## 2022-07-12 NOTE — Progress Notes (Signed)
[facility] ADULT ICU REPLACEMENT PROTOCOL   The patient does apply for the [facility] Adult ICU Electrolyte Replacment Protocol based on the criteria listed below:   1.Exclusion criteria: TCTS, ECMO, Dialysis, and Myasthenia Gravis patients 2. Is GFR >/= 30 ml/min? Yes.    Patient's GFR today is >60 3. Is SCr </= 2? Yes.   Patient's SCr is 1.0 mg/dL 4. Did SCr increase >/= 0.5 in 24 hours? No. 5.Pt's weight >40kg  Yes.   6. Abnormal electrolyte(s):   K 3.3  7. Electrolytes replaced per protocol 8.  Call MD STAT for K+ </= 2.5, Phos </= 1, or Mag </= 1 Physician:  A. Doree Fudge Joda Braatz 07/12/2022 3:38 AM

## 2022-07-13 ENCOUNTER — Inpatient Hospital Stay (HOSPITAL_COMMUNITY): Payer: Medicare HMO

## 2022-07-13 DIAGNOSIS — J9601 Acute respiratory failure with hypoxia: Secondary | ICD-10-CM | POA: Diagnosis not present

## 2022-07-13 DIAGNOSIS — J984 Other disorders of lung: Secondary | ICD-10-CM

## 2022-07-13 DIAGNOSIS — J441 Chronic obstructive pulmonary disease with (acute) exacerbation: Secondary | ICD-10-CM

## 2022-07-13 LAB — BASIC METABOLIC PANEL
Anion gap: 10 (ref 5–15)
BUN: 22 mg/dL (ref 8–23)
CO2: 23 mmol/L (ref 22–32)
Calcium: 8.3 mg/dL — ABNORMAL LOW (ref 8.9–10.3)
Chloride: 106 mmol/L (ref 98–111)
Creatinine, Ser: 1.02 mg/dL (ref 0.61–1.24)
GFR, Estimated: >60 mL/min (ref >60)
Glucose, Bld: 146 mg/dL — ABNORMAL HIGH (ref 70–99)
Potassium: 3.7 mmol/L (ref 3.5–5.1)
Sodium: 139 mmol/L (ref 135–145)

## 2022-07-13 LAB — BLOOD CULTURE ID PANEL (REFLEXED) - BCID2

## 2022-07-13 LAB — GLUCOSE, CAPILLARY
Glucose-Capillary: 125 mg/dL — ABNORMAL HIGH (ref 70–99)
Glucose-Capillary: 134 mg/dL — ABNORMAL HIGH (ref 70–99)
Glucose-Capillary: 135 mg/dL — ABNORMAL HIGH (ref 70–99)
Glucose-Capillary: 151 mg/dL — ABNORMAL HIGH (ref 70–99)
Glucose-Capillary: 173 mg/dL — ABNORMAL HIGH (ref 70–99)

## 2022-07-13 LAB — STREP PNEUMONIAE URINARY ANTIGEN: Strep Pneumo Urinary Antigen: NEGATIVE

## 2022-07-13 LAB — CULTURE, BLOOD (ROUTINE X 2): Special Requests: ADEQUATE

## 2022-07-13 LAB — CBC
HCT: 39.5 % (ref 39.0–52.0)
Hemoglobin: 13.4 g/dL (ref 13.0–17.0)
MCH: 26.3 pg (ref 26.0–34.0)
MCHC: 33.9 g/dL (ref 30.0–36.0)
MCV: 77.5 fL — ABNORMAL LOW (ref 80.0–100.0)
Platelets: 234 10*3/uL (ref 150–400)
RBC: 5.1 MIL/uL (ref 4.22–5.81)
RDW: 16.7 % — ABNORMAL HIGH (ref 11.5–15.5)
WBC: 14.7 10*3/uL — ABNORMAL HIGH (ref 4.0–10.5)
nRBC: 0 % (ref 0.0–0.2)

## 2022-07-13 LAB — PHOSPHORUS: Phosphorus: 3.3 mg/dL (ref 2.5–4.6)

## 2022-07-13 LAB — MAGNESIUM: Magnesium: 2.4 mg/dL (ref 1.7–2.4)

## 2022-07-13 MED ORDER — FUROSEMIDE 10 MG/ML IJ SOLN
40.0000 mg | Freq: Two times a day (BID) | INTRAMUSCULAR | Status: DC
  Administered 2022-07-13 – 2022-07-14 (×3): 40 mg via INTRAVENOUS
  Filled 2022-07-13 (×3): qty 4

## 2022-07-13 MED ORDER — METHYLPREDNISOLONE SODIUM SUCC 40 MG IJ SOLR
40.0000 mg | Freq: Every day | INTRAMUSCULAR | Status: AC
  Administered 2022-07-14 – 2022-07-17 (×4): 40 mg via INTRAVENOUS
  Filled 2022-07-13 (×4): qty 1

## 2022-07-13 MED ORDER — SODIUM CHLORIDE 0.9 % IV SOLN
500.0000 mg | INTRAVENOUS | Status: AC
  Administered 2022-07-14: 500 mg via INTRAVENOUS
  Filled 2022-07-13: qty 5

## 2022-07-13 MED ORDER — SODIUM CHLORIDE 0.9 % IV SOLN: 2.0000 g | INTRAVENOUS | Status: DC

## 2022-07-13 MED ORDER — STERILE WATER FOR INJECTION IJ SOLN
INTRAMUSCULAR | Status: AC
  Administered 2022-07-13: 10 mL
  Filled 2022-07-13: qty 10

## 2022-07-13 MED ORDER — ACETAMINOPHEN 325 MG PO TABS
650.0000 mg | ORAL_TABLET | Freq: Four times a day (QID) | ORAL | Status: DC | PRN
  Administered 2022-07-13 – 2022-07-17 (×4): 650 mg via ORAL
  Filled 2022-07-13 (×4): qty 2

## 2022-07-13 MED ORDER — POTASSIUM CHLORIDE CRYS ER 20 MEQ PO TBCR
40.0000 meq | EXTENDED_RELEASE_TABLET | Freq: Once | ORAL | Status: AC
  Administered 2022-07-13: 40 meq via ORAL
  Filled 2022-07-13: qty 2

## 2022-07-13 MED ORDER — INSULIN ASPART 100 UNIT/ML IJ SOLN
0.0000 [IU] | INTRAMUSCULAR | Status: DC
  Administered 2022-07-13 – 2022-07-14 (×2): 3 [IU] via SUBCUTANEOUS
  Administered 2022-07-14: 8 [IU] via SUBCUTANEOUS
  Administered 2022-07-14 (×2): 2 [IU] via SUBCUTANEOUS
  Administered 2022-07-15: 3 [IU] via SUBCUTANEOUS
  Administered 2022-07-15: 2 [IU] via SUBCUTANEOUS

## 2022-07-13 MED ORDER — SODIUM CHLORIDE 0.9 % IV SOLN
3.0000 g | Freq: Four times a day (QID) | INTRAVENOUS | Status: DC
  Administered 2022-07-13 – 2022-07-14 (×4): 3 g via INTRAVENOUS
  Filled 2022-07-13 (×5): qty 8

## 2022-07-13 NOTE — IPAL (Signed)
  Interdisciplinary Goals of Care Family Meeting   Date carried out: 07/13/2022  Location of the meeting: Bedside  Member's involved: Physician, Bedside Registered Nurse, Social Worker, and Family Member or next of kin  Durable Power of Attorney or acting medical decision maker: Zachary Briggs, Zachary Briggs and Zachary Briggs    Discussion: We discussed goals of care for Zachary Briggs .   I have updated family that the [name] is requiring maximum amount of oxygen that we can give without keeping him on ventilator.  He is still not getting better despite maximum medical therapy.  We will continue to try but if he started getting hypoxic we need to decide, if he is willing to try ventilator for short-term.  Initially patient refused even short-term intubation and mechanical ventilation, after long discussion amongst family members and I, Zachary Briggs decided if his oxygen saturation drops, he would try short-term ventilatory management.  He stated that if things are not working he would not want to live on mechanical ventilator or by any other artificial means, all patient's family members agreed  Code status:   Code Status: Full Code   Disposition: Continue current acute care   Family are satisfied with Plan of action and management. All questions answered   Cheri Fowler MD [place] Pulmonary Critical Care See Amion for pager If no response to pager, please call [phone] until 7pm After 7pm, Please call E-link [phone]

## 2022-07-13 NOTE — Progress Notes (Signed)
NAME:  Zachary Briggs, MRN:  [id number], DOB:  April 09, 1955, LOS: 3 ADMISSION DATE:  07/10/2022, CONSULTATION DATE:  5/2 REFERRING MD:  Rancour, CHIEF COMPLAINT:  respiratory failure   History of Present Illness:  This is a 67 year old male patient with history as mentioned below.  He is service-connected, retired from [employer].  Has a significant history of COPD, as well as diastolic heart failure, aortic valve stenosis, and polysubstance abuse including cocaine nicotine and alcohol.  States he was in his usual state of health up until 5/1 when he began to develop rather acute onset of shortness of breath and cough.  He describes the cough is productive of clear sputum.  He denied sick exposure, denied chest pain, denies nasal congestion, sore throat, wheezing.  Stopped smoking this a.m. on admission 5/2, last smoked cocaine on 5/1.  Patient called EMS today, on EMS arrival pulse oximetry 35% on room air placed on CPAP only improving to 65%.  En route he was given IV Solu-Medrol, IV magnesium, supplemental oxygen.  On presentation chest x-ray was obtained shows bilateral airspace disease with more dense consolidation in the right base.  Placed on NIPPV requiring escalation of PEEP up to 10 to maintain pulse oximetry greater than 90%.  Critical care asked admit given respiratory distress  Pertinent  Medical History  Aortic valve stenosis, BPH, cannabis dependence, prostate cancer, cocaine dependence, essential hypertension, GERD, headache, hepatitis C, back pain, mixed anxiety and depression disorder, heart murmur, nicotine dependence, nonsustained VT, SVT, history of pulmonary nodule, grade 1 diastolic dysfunction   Significant Hospital Events: Including procedures, antibiotic start and stop dates in addition to other pertinent events   5/2 admitted with acute respiratory failure secondary to mixed picture of acute decompensated heart failure and pneumonia versus perhaps drug-induced pneumonitis.   Started on noninvasive positive pressure ventilation, IV ceftriaxone, azithromycin, and hydrocortisone admitted to ICU  5/3 variable NPPV compliance. Weaning FiO2 on BiPAP -->  HHFNC 5/4 cont to wean FiO2  5/5 adv diet, diuresing   Interim History / Subjective:  NAEO  Objective   Blood pressure (!) 158/87, pulse (!) 103, temperature 98.5 F (36.9 C), temperature source Axillary, resp. rate (!) 22, height 5\' 5"  (1.651 m), weight 84.1 kg, SpO2 (!) 88 %.    Vent Mode: PCV;BIPAP FiO2 (%):  [70 %-100 %] 100 % Set Rate:  [18 bmp] 18 bmp PEEP:  [8 cmH20] 8 cmH20   Intake/Output Summary (Last 24 hours) at 07/13/2022 1338 Last data filed at 07/13/2022 1100 Gross per 24 hour  Intake 120 ml  Output 1900 ml  Net -1780 ml   Filed Weights   07/11/22 0356 07/12/22 0444 07/13/22 0500  Weight: 84.7 kg 83.8 kg 84.1 kg    Examination: General: Chronically and acutely ill M NAD  Neuro: AAOx4  HENT: HHFNC in place  Lungs Even unlabored, diminished bases, some crackles  Cardiovascular: tachy, regular  GI: soft ndnt  GU: external cath Extremities: no acute joint deformity    Resolved Hospital Problem list     Assessment & Plan:     Acute hypoxic resp failure Emphysema Pulmonary edema CAP vs pneumonitis  -ongoing crack/cocaine smoker  Plan -Get a CXR this morning, expect likely will need more diuresis  -cont azithro rocephin  -solumedrol  (+ PPI) -f/u strep pneumo, legionella. These have been ordered for days.  -Wean O2 as able goal >88% -- starting to wean HHFNC, not yet ready to transfer out of unit    AoC  diastolic HF Moderate AS HTN HLD Plan -possible diuresis as above  -holding home antihypertensives   -lipitor -PRN hydral SBP goal < 180   Hypokalemia Hypermagnesemia  Plan -if diuresing again, will give K   Substance abuse  Chronic pain Anxiety  -ongoing crack/cocaine smoking several times/wk  P -cessation support  -holding home gabapentin, baclofen  -cont  zoloft  -PRN zyprexa   Hyperglycemia Plan -SSI   Hx Hep C  Plan -no acute intervention   Inadequate PO intake -adv diet as tolerated    Best Practice (right click and "Reselect all SmartList Selections" daily)   Diet/type: clear liquids -- adv as tolerated, slowly  DVT prophylaxis: prophylactic heparin  GI prophylaxis: PPI Lines: N/A Foley:  N/A Code Status:  full code Last date of multidisciplinary goals of care discussion [full]  Labs   CBC: Recent Labs  Lab 07/10/22 1305 07/10/22 1323 07/10/22 1448 07/10/22 2007 07/11/22 0226 07/11/22 1205 07/12/22 0044 07/13/22 0015  WBC 19.6*  --  18.5*  --  16.9* 18.8* 17.6* 14.7*  NEUTROABS 16.6*  --   --   --   --  16.9*  --   --   HGB 13.8   < > 14.7 14.3 12.8* 13.4 12.5* 13.4  HCT 41.1   < > 44.6 42.0 39.7 40.9 38.2* 39.5  MCV 79.3*  --  79.5*  --  79.7* 79.6* 79.3* 77.5*  PLT 236  --  241  --  205 225 239 234   < > = values in this interval not displayed.    Basic Metabolic Panel: Recent Labs  Lab 07/11/22 0226 07/11/22 0659 07/11/22 1205 07/12/22 0044 07/13/22 0015  NA 139 139 139 138 139  K 3.6 3.5 3.6 3.3* 3.7  CL 107 105 104 104 106  CO2 20* 21* 21* 21* 23  GLUCOSE 185* 160* 146* 186* 146*  BUN 17 19 21  25* 22  CREATININE 1.10 1.16 1.02 1.00 1.02  CALCIUM 8.4* 8.5* 8.5* 8.2* 8.3*  MG 2.6*  --   --  2.7* 2.4  PHOS 2.9  --   --   --  3.3   GFR: Estimated Creatinine Clearance: 71 mL/min (by C-G formula based on SCr of 1.02 mg/dL). Recent Labs  Lab 07/10/22 1448 07/10/22 1545 07/10/22 1939 07/11/22 0226 07/11/22 1205 07/12/22 0044 07/13/22 0015  PROCALCITON 1.09  --   --   --   --   --   --   WBC 18.5*  --   --  16.9* 18.8* 17.6* 14.7*  LATICACIDVEN  --  1.4 1.3  --   --   --   --     Liver Function Tests: Recent Labs  Lab 07/10/22 1305  AST 32  ALT 30  ALKPHOS 123  BILITOT 1.2  PROT 7.4  ALBUMIN 3.1*   Recent Labs  Lab 07/10/22 1305  LIPASE 22   No results for input(s):  "AMMONIA" in the last 168 hours.  ABG    Component Value Date/Time   PHART 7.404 07/10/2022 2007   PCO2ART 31.2 (L) 07/10/2022 2007   PO2ART 97 07/10/2022 2007   HCO3 19.5 (L) 07/10/2022 2007   TCO2 20 (L) 07/10/2022 2007   ACIDBASEDEF 4.0 (H) 07/10/2022 2007   O2SAT 98 07/10/2022 2007     Coagulation Profile: Recent Labs  Lab 07/10/22 1305  INR 1.2    Cardiac Enzymes: No results for input(s): "CKTOTAL", "CKMB", "CKMBINDEX", "TROPONINI" in the last 168 hours.  HbA1C: Hgb A1c  MFr Bld  Date/Time Value Ref Range Status  07/10/2022 02:51 PM 6.4 (H) 4.8 - 5.6 % Final    Comment:    (NOTE) Pre diabetes:          5.7%-6.4%  Diabetes:              >6.4%  Glycemic control for   <7.0% adults with diabetes   07/16/2021 11:49 AM 6.6 (H) 4.8 - 5.6 % Final    Comment:    (NOTE) Pre diabetes:          5.7%-6.4%  Diabetes:              >6.4%  Glycemic control for   <7.0% adults with diabetes     CBG: Recent Labs  Lab 07/12/22 1140 07/12/22 1613 07/12/22 2150 07/13/22 0742 07/13/22 1134  GLUCAP 131* 215* 146* 135* 151*    CRITICAL CARE Performed by: Lanier Clam   Total critical care time: 36 minutes  Critical care time was exclusive of separately billable procedures and treating other patients. Critical care was necessary to treat or prevent imminent or life-threatening deterioration.  Critical care was time spent personally by me on the following activities: development of treatment plan with patient and/or surrogate as well as nursing, discussions with consultants, evaluation of patient's response to treatment, examination of patient, obtaining history from patient or surrogate, ordering and performing treatments and interventions, ordering and review of laboratory studies, ordering and review of radiographic studies, pulse oximetry and re-evaluation of patient's condition.  Tessie Fass MSN, AGACNP-BC [facility] Pulmonary/Critical Care Medicine Amion for  pager 07/13/2022, 1:38 PM

## 2022-07-13 NOTE — Progress Notes (Signed)
eLink Physician-Brief Progress Note Patient Name: Zachary Briggs [facility] DOB: [birth year] MRN: [id number]   Date of Service  07/13/2022  HPI/Events of Note  Nurse reports headache.  Neuro exam intact. Vitals stable  eICU Interventions  Tylenol ordered     Intervention Category Intermediate Interventions: Pain - evaluation and management Minor Interventions: Other:  Henry Russel, P 07/13/2022, 4:30 AM

## 2022-07-13 NOTE — Progress Notes (Signed)
Pt taken of HHFNC and placed on PC/NIV for the night. Pt tolerating well.

## 2022-07-13 NOTE — Progress Notes (Signed)
PHARMACY - PHYSICIAN COMMUNICATION CRITICAL VALUE ALERT - BLOOD CULTURE IDENTIFICATION (BCID)  Zachary Briggs is an 67 y.o. male who presented to [facility] on 07/10/2022    Name of physician (or Provider) Contacted: Henry Russel, MD (CCM)  Current antibiotics: Ceftriaxone, Azithromycin   Changes to prescribed antibiotics recommended:  No changes  Results for orders placed or performed during the hospital encounter of 07/10/22  Blood Culture ID Panel (Reflexed) (Collected: 07/10/2022  4:45 PM)  Result Value Ref Range   Enterococcus faecalis NOT DETECTED NOT DETECTED   Enterococcus Faecium NOT DETECTED NOT DETECTED   Listeria monocytogenes NOT DETECTED NOT DETECTED   Staphylococcus species DETECTED (A) NOT DETECTED   Staphylococcus aureus (BCID) NOT DETECTED NOT DETECTED   Staphylococcus epidermidis NOT DETECTED NOT DETECTED   Staphylococcus lugdunensis NOT DETECTED NOT DETECTED   Streptococcus species NOT DETECTED NOT DETECTED   Streptococcus agalactiae NOT DETECTED NOT DETECTED   Streptococcus pneumoniae NOT DETECTED NOT DETECTED   Streptococcus pyogenes NOT DETECTED NOT DETECTED   A.calcoaceticus-baumannii NOT DETECTED NOT DETECTED   Bacteroides fragilis NOT DETECTED NOT DETECTED   Enterobacterales NOT DETECTED NOT DETECTED   Enterobacter cloacae complex NOT DETECTED NOT DETECTED   Escherichia coli NOT DETECTED NOT DETECTED   Klebsiella aerogenes NOT DETECTED NOT DETECTED   Klebsiella oxytoca NOT DETECTED NOT DETECTED   Klebsiella pneumoniae NOT DETECTED NOT DETECTED   Proteus species NOT DETECTED NOT DETECTED   Salmonella species NOT DETECTED NOT DETECTED   Serratia marcescens NOT DETECTED NOT DETECTED   Haemophilus influenzae NOT DETECTED NOT DETECTED   Neisseria meningitidis NOT DETECTED NOT DETECTED   Pseudomonas aeruginosa NOT DETECTED NOT DETECTED   Stenotrophomonas maltophilia NOT DETECTED NOT DETECTED   Candida albicans NOT DETECTED NOT DETECTED   Candida auris NOT  DETECTED NOT DETECTED   Candida glabrata NOT DETECTED NOT DETECTED   Candida krusei NOT DETECTED NOT DETECTED   Candida parapsilosis NOT DETECTED NOT DETECTED   Candida tropicalis NOT DETECTED NOT DETECTED   Cryptococcus neoformans/gattii NOT DETECTED NOT DETECTED    Abran Duke, PharmD, BCPS Clinical Pharmacist Phone: [phone]

## 2022-07-14 ENCOUNTER — Inpatient Hospital Stay (HOSPITAL_COMMUNITY): Payer: Medicare HMO

## 2022-07-14 DIAGNOSIS — J9601 Acute respiratory failure with hypoxia: Secondary | ICD-10-CM | POA: Diagnosis not present

## 2022-07-14 LAB — COMPREHENSIVE METABOLIC PANEL
ALT: 283 U/L — ABNORMAL HIGH (ref 0–44)
AST: 114 U/L — ABNORMAL HIGH (ref 15–41)
Albumin: 3 g/dL — ABNORMAL LOW (ref 3.5–5.0)
Alkaline Phosphatase: 130 U/L — ABNORMAL HIGH (ref 38–126)
Anion gap: 15 (ref 5–15)
BUN: 20 mg/dL (ref 8–23)
CO2: 23 mmol/L (ref 22–32)
Calcium: 8.7 mg/dL — ABNORMAL LOW (ref 8.9–10.3)
Chloride: 102 mmol/L (ref 98–111)
Creatinine, Ser: 1.13 mg/dL (ref 0.61–1.24)
GFR, Estimated: >60 mL/min (ref >60)
Glucose, Bld: 108 mg/dL — ABNORMAL HIGH (ref 70–99)
Potassium: 3.8 mmol/L (ref 3.5–5.1)
Sodium: 140 mmol/L (ref 135–145)
Total Bilirubin: 0.9 mg/dL (ref 0.3–1.2)
Total Protein: 7.6 g/dL (ref 6.5–8.1)

## 2022-07-14 LAB — GLUCOSE, CAPILLARY
Glucose-Capillary: 106 mg/dL — ABNORMAL HIGH (ref 70–99)
Glucose-Capillary: 110 mg/dL — ABNORMAL HIGH (ref 70–99)
Glucose-Capillary: 146 mg/dL — ABNORMAL HIGH (ref 70–99)
Glucose-Capillary: 193 mg/dL — ABNORMAL HIGH (ref 70–99)
Glucose-Capillary: 284 mg/dL — ABNORMAL HIGH (ref 70–99)
Glucose-Capillary: 87 mg/dL (ref 70–99)

## 2022-07-14 LAB — LEGIONELLA PNEUMOPHILA SEROGP 1 UR AG: L. pneumophila Serogp 1 Ur Ag: NEGATIVE

## 2022-07-14 LAB — CBC
HCT: 42.3 % (ref 39.0–52.0)
Hemoglobin: 13.8 g/dL (ref 13.0–17.0)
MCH: 25.5 pg — ABNORMAL LOW (ref 26.0–34.0)
MCHC: 32.6 g/dL (ref 30.0–36.0)
MCV: 78 fL — ABNORMAL LOW (ref 80.0–100.0)
Platelets: 243 10*3/uL (ref 150–400)
RBC: 5.42 MIL/uL (ref 4.22–5.81)
RDW: 16.9 % — ABNORMAL HIGH (ref 11.5–15.5)
WBC: 13.3 10*3/uL — ABNORMAL HIGH (ref 4.0–10.5)
nRBC: 0 % (ref 0.0–0.2)

## 2022-07-14 LAB — CULTURE, BLOOD (ROUTINE X 2): Special Requests: ADEQUATE

## 2022-07-14 MED ORDER — SODIUM CHLORIDE 0.9 % IV SOLN
3.0000 g | Freq: Four times a day (QID) | INTRAVENOUS | Status: AC
  Administered 2022-07-14 – 2022-07-16 (×10): 3 g via INTRAVENOUS
  Filled 2022-07-14 (×10): qty 8

## 2022-07-14 MED ORDER — METOLAZONE 5 MG PO TABS
5.0000 mg | ORAL_TABLET | Freq: Once | ORAL | Status: AC
  Administered 2022-07-14: 5 mg via ORAL
  Filled 2022-07-14: qty 1

## 2022-07-14 MED ORDER — POTASSIUM CHLORIDE CRYS ER 20 MEQ PO TBCR
40.0000 meq | EXTENDED_RELEASE_TABLET | Freq: Two times a day (BID) | ORAL | Status: AC
  Administered 2022-07-14 (×2): 40 meq via ORAL
  Filled 2022-07-14 (×2): qty 2

## 2022-07-14 MED ORDER — FUROSEMIDE 10 MG/ML IJ SOLN
40.0000 mg | Freq: Three times a day (TID) | INTRAMUSCULAR | Status: DC
  Administered 2022-07-14 – 2022-07-16 (×6): 40 mg via INTRAVENOUS
  Filled 2022-07-14 (×5): qty 4

## 2022-07-14 MED ORDER — ALBUMIN HUMAN 25 % IV SOLN
25.0000 g | Freq: Four times a day (QID) | INTRAVENOUS | Status: AC
  Administered 2022-07-14 – 2022-07-15 (×4): 25 g via INTRAVENOUS
  Filled 2022-07-14 (×4): qty 100

## 2022-07-14 NOTE — Progress Notes (Signed)
Pt awake requested to come off BIPAP. Pt placed back on HHFNC 60L/100% + NRB if needed. Pt tolerating well.

## 2022-07-14 NOTE — TOC Initial Note (Signed)
Transition of Care [facility]) - Initial/Assessment Note    Patient Details  Name: Zachary Briggs MRN: [id number] Date of Birth: 1956-02-12  Transition of Care [facility]) CM/SW Contact:    Ralene Bathe, LCSWA Phone Number: 07/14/2022, 1:05 PM  Clinical Narrative:                 CSW received consult for possible LTACH placement at time of discharge. CSW spoke with patient. Patient expressed understanding of recommendation and is agreeable to [facility] placement at time of discharge.  LCSW presented choice and the patient reports preference for [facility].  The patient plans to return home with his spouse after being discharged from the [facility] if accepted. CSW discussed insurance authorization process.    CSW contacted [facility] representative and informed of patient's choice.    TOC following.   Expected Discharge Plan: Long Term Acute Care (LTAC) Barriers to Discharge: Continued Medical Work up, English as a second language teacher   Patient Goals and CMS Choice Patient states their goals for this hospitalization and ongoing recovery are:: To return home when medically ready CMS Medicare.gov Compare Post Acute Care list provided to:: Patient Choice offered to / list presented to : Patient      Expected Discharge Plan and Services In-house Referral: Clinical Social Work   Post Acute Care Choice: Long Term Acute Care (LTAC) Living arrangements for the past 2 months: Single Family Home                                      Prior Living Arrangements/Services Living arrangements for the past 2 months: Single Family Home Lives with:: Spouse Patient language and need for interpreter reviewed:: Yes Do you feel safe going back to the place where you live?: Yes      Need for Family Participation in Patient Care: Yes (Comment) Care giver support system in place?: Yes (comment)   Criminal Activity/Legal Involvement Pertinent to Current Situation/Hospitalization: No - Comment as needed  Activities of Daily  Living      Permission Sought/Granted   Permission granted to share information with : Yes, Release of Information Signed     Permission granted to share info w AGENCY: LTACHs        Emotional Assessment Appearance:: Appears older than stated age Attitude/Demeanor/Rapport: Ambitious Affect (typically observed): Adaptable Orientation: : Oriented to Situation, Oriented to  Time, Oriented to Place, Oriented to Self Alcohol / Substance Use: Not Applicable Psych Involvement: No (comment)  Admission diagnosis:  Acute respiratory failure (HCC) [J96.00] Acute respiratory failure with hypoxia (HCC) [J96.01] Patient Active Problem List   Diagnosis Date Noted   Lung problems from crack cocaine (HCC) 07/13/2022   Hypokalemia 07/12/2022   Crack cocaine use 07/11/2022   Acute on chronic diastolic heart failure (HCC) 07/11/2022   Acute respiratory failure (HCC) 07/10/2022   Weakness acquired in ICU 07/29/2021   Acute encephalopathy    Community acquired pneumonia    Hypernatremia 07/21/2021   ARDS (adult respiratory distress syndrome) (HCC) 07/20/2021   Acute on chronic respiratory failure with hypoxia and hypercapnia (HCC) 07/20/2021   Obesity (BMI 30-39.9) 07/20/2021   Type 2 diabetes mellitus with hyperglycemia (HCC) 07/20/2021   Aortic stenosis 12/30/2015   Aortic atherosclerosis (HCC) 12/29/2015   Bulla of lung (HCC) 12/29/2015   Chest pain 12/28/2015   Back pain 12/28/2015   Hypertension    Tobacco abuse    PCP:  [facility]  Medical Pharmacy:   [facility] [phone] [place], [place] - [address] [place] [address] Phone: [phone] Fax: [phone]  [facility] [place], [place] - [address] [place] [address] Phone: [phone] Fax: [phone]     Social Determinants of Health (SDOH) Social History: SDOH Screenings   Tobacco Use: High Risk (07/10/2022)   SDOH Interventions:      Readmission Risk Interventions    07/31/2021    1:32 PM  Readmission Risk Prevention Plan  Transportation Screening Complete  PCP or Specialist Appt within 3-5 Days Complete  HRI or Home Care Consult Complete  Social Work Consult for Recovery Care Planning/Counseling Complete  Palliative Care Screening Not Applicable  Medication Review Oceanographer) Referral to Pharmacy

## 2022-07-14 NOTE — Progress Notes (Signed)
NAME:  Zachary Briggs, MRN:  [id number], DOB:  February 23, 1956, LOS: 4 ADMISSION DATE:  07/10/2022, CONSULTATION DATE:  5/2 REFERRING MD:  Rancour, CHIEF COMPLAINT:  respiratory failure   History of Present Illness:  This is a 67 year old male patient with history as mentioned below.  He is service-connected, retired from [employer].  Has a significant history of COPD, as well as diastolic heart failure, aortic valve stenosis, and polysubstance abuse including cocaine nicotine and alcohol.  States he was in his usual state of health up until 5/1 when he began to develop rather acute onset of shortness of breath and cough.  He describes the cough is productive of clear sputum.  He denied sick exposure, denied chest pain, denies nasal congestion, sore throat, wheezing.  Stopped smoking this a.m. on admission 5/2, last smoked cocaine on 5/1.  Patient called EMS today, on EMS arrival pulse oximetry 35% on room air placed on CPAP only improving to 65%.  En route he was given IV Solu-Medrol, IV magnesium, supplemental oxygen.  On presentation chest x-ray was obtained shows bilateral airspace disease with more dense consolidation in the right base.  Placed on NIPPV requiring escalation of PEEP up to 10 to maintain pulse oximetry greater than 90%.  Critical care asked admit given respiratory distress  Pertinent  Medical History  Aortic valve stenosis, BPH, cannabis dependence, prostate cancer, cocaine dependence, essential hypertension, GERD, headache, hepatitis C, back pain, mixed anxiety and depression disorder, heart murmur, nicotine dependence, nonsustained VT, SVT, history of pulmonary nodule, grade 1 diastolic dysfunction   Significant Hospital Events: Including procedures, antibiotic start and stop dates in addition to other pertinent events   5/2 admitted with acute respiratory failure secondary to mixed picture of acute decompensated heart failure and pneumonia versus perhaps drug-induced pneumonitis.   Started on noninvasive positive pressure ventilation, IV ceftriaxone, azithromycin, and hydrocortisone admitted to ICU  5/3 variable NPPV compliance. Weaning FiO2 on BiPAP -->  HHFNC 5/4 cont to wean FiO2  5/5 adv diet, diuresing   Interim History / Subjective:  No events Breathing slightly improved  Objective   Blood pressure (!) 152/85, pulse (!) 103, temperature 98 F (36.7 C), temperature source Oral, resp. rate 20, height 5\' 5"  (1.651 m), weight 81.6 kg, SpO2 93 %.    Vent Mode: PCV;BIPAP FiO2 (%):  [80 %-100 %] 100 % Set Rate:  [14 bmp] 14 bmp PEEP:  [6 cmH20] 6 cmH20   Intake/Output Summary (Last 24 hours) at 07/14/2022 1131 Last data filed at 07/14/2022 1037 Gross per 24 hour  Intake 479.88 ml  Output 2450 ml  Net -1970.12 ml    Filed Weights   07/12/22 0444 07/13/22 0500 07/14/22 0500  Weight: 83.8 kg 84.1 kg 81.6 kg    Examination:  LFTs up a bit Resolved Hospital Problem list     Assessment & Plan:  Acute hypoxemic resp failure -ongoing crack/cocaine smoker.  Treating for fluid, infection, inflammation, slowly improving. AoC diastolic HF Moderate AS HTN HLD Hypokalemia Hypermagnesemia  Substance abuse  Chronic pain-holding home gabapentin, baclofen  Anxiety  Hx hepatitis, HepC  - Cont zoloft  - PRN zyprexa  - Push diuresis, add metolazone/albumin push - Replete K - Check RUQ [place] - Continue steroids, abx for now (day 5/7) - Remains in ICU for HHFNC/BIPAP wean, high risk to progress to intubation - Okay for referral to [facility]  Best Practice (right click and "Reselect all SmartList Selections" daily)   Diet/type: clear liquids -- adv as tolerated,  slowly  DVT prophylaxis: prophylactic heparin  GI prophylaxis: PPI Lines: N/A Foley:  N/A Code Status:  full code Last date of multidisciplinary goals of care discussion [07/13/22]  33 min cc time Myrla Halsted MD PCCM

## 2022-07-15 DIAGNOSIS — J9601 Acute respiratory failure with hypoxia: Secondary | ICD-10-CM | POA: Diagnosis not present

## 2022-07-15 DIAGNOSIS — J441 Chronic obstructive pulmonary disease with (acute) exacerbation: Secondary | ICD-10-CM | POA: Diagnosis not present

## 2022-07-15 LAB — BASIC METABOLIC PANEL
Anion gap: 17 — ABNORMAL HIGH (ref 5–15)
BUN: 26 mg/dL — ABNORMAL HIGH (ref 8–23)
CO2: 24 mmol/L (ref 22–32)
Calcium: 10.1 mg/dL (ref 8.9–10.3)
Chloride: 99 mmol/L (ref 98–111)
Creatinine, Ser: 1.24 mg/dL (ref 0.61–1.24)
GFR, Estimated: >60 mL/min (ref >60)
Glucose, Bld: 184 mg/dL — ABNORMAL HIGH (ref 70–99)
Potassium: 4.1 mmol/L (ref 3.5–5.1)
Sodium: 140 mmol/L (ref 135–145)

## 2022-07-15 LAB — GLUCOSE, CAPILLARY
Glucose-Capillary: 115 mg/dL — ABNORMAL HIGH (ref 70–99)
Glucose-Capillary: 147 mg/dL — ABNORMAL HIGH (ref 70–99)
Glucose-Capillary: 181 mg/dL — ABNORMAL HIGH (ref 70–99)
Glucose-Capillary: 162 mg/dL — ABNORMAL HIGH (ref 70–99)
Glucose-Capillary: 123 mg/dL — ABNORMAL HIGH (ref 70–99)

## 2022-07-15 LAB — CBC
HCT: 38.8 % — ABNORMAL LOW (ref 39.0–52.0)
Hemoglobin: 13.3 g/dL (ref 13.0–17.0)
MCH: 26.3 pg (ref 26.0–34.0)
MCHC: 34.3 g/dL (ref 30.0–36.0)
MCV: 76.8 fL — ABNORMAL LOW (ref 80.0–100.0)
Platelets: 292 10*3/uL (ref 150–400)
RBC: 5.05 MIL/uL (ref 4.22–5.81)
RDW: 16.7 % — ABNORMAL HIGH (ref 11.5–15.5)
WBC: 14.3 10*3/uL — ABNORMAL HIGH (ref 4.0–10.5)
nRBC: 0 % (ref 0.0–0.2)

## 2022-07-15 LAB — HEPATIC FUNCTION PANEL
ALT: 165 U/L — ABNORMAL HIGH (ref 0–44)
AST: 54 U/L — ABNORMAL HIGH (ref 15–41)
Albumin: 4.6 g/dL (ref 3.5–5.0)
Alkaline Phosphatase: 101 U/L (ref 38–126)
Bilirubin, Direct: 0.1 mg/dL (ref 0.0–0.2)
Indirect Bilirubin: 1.1 mg/dL — ABNORMAL HIGH (ref 0.3–0.9)
Total Bilirubin: 1.2 mg/dL (ref 0.3–1.2)
Total Protein: 8.7 g/dL — ABNORMAL HIGH (ref 6.5–8.1)

## 2022-07-15 LAB — CULTURE, BLOOD (ROUTINE X 2): Special Requests: ADEQUATE

## 2022-07-15 LAB — MAGNESIUM: Magnesium: 2.4 mg/dL (ref 1.7–2.4)

## 2022-07-15 LAB — PHOSPHORUS: Phosphorus: 4.7 mg/dL — ABNORMAL HIGH (ref 2.5–4.6)

## 2022-07-15 MED ORDER — CARVEDILOL 6.25 MG PO TABS
6.2500 mg | ORAL_TABLET | Freq: Two times a day (BID) | ORAL | Status: DC
  Administered 2022-07-15 – 2022-07-23 (×17): 6.25 mg via ORAL
  Filled 2022-07-15 (×17): qty 1

## 2022-07-15 MED ORDER — METOLAZONE 5 MG PO TABS
5.0000 mg | ORAL_TABLET | Freq: Once | ORAL | Status: AC
  Administered 2022-07-15: 5 mg via ORAL
  Filled 2022-07-15: qty 1

## 2022-07-15 MED ORDER — HYDROCODONE-ACETAMINOPHEN 5-325 MG PO TABS: 1.0000 | ORAL_TABLET | ORAL | Status: DC | PRN

## 2022-07-15 MED ORDER — ASPIRIN 325 MG PO TBEC: 325.0000 mg | DELAYED_RELEASE_TABLET | Freq: Every day | ORAL | Status: DC

## 2022-07-15 MED ORDER — INSULIN ASPART 100 UNIT/ML IJ SOLN
0.0000 [IU] | Freq: Three times a day (TID) | INTRAMUSCULAR | Status: DC
  Administered 2022-07-15: 3 [IU] via SUBCUTANEOUS
  Administered 2022-07-15 – 2022-07-16 (×2): 2 [IU] via SUBCUTANEOUS
  Administered 2022-07-16 (×2): 3 [IU] via SUBCUTANEOUS
  Administered 2022-07-16: 2 [IU] via SUBCUTANEOUS
  Administered 2022-07-17: 3 [IU] via SUBCUTANEOUS
  Administered 2022-07-17: 5 [IU] via SUBCUTANEOUS
  Administered 2022-07-18: 2 [IU] via SUBCUTANEOUS
  Administered 2022-07-18: 3 [IU] via SUBCUTANEOUS
  Administered 2022-07-18 (×2): 5 [IU] via SUBCUTANEOUS
  Administered 2022-07-19: 3 [IU] via SUBCUTANEOUS
  Administered 2022-07-19: 5 [IU] via SUBCUTANEOUS
  Administered 2022-07-19: 3 [IU] via SUBCUTANEOUS
  Administered 2022-07-19 – 2022-07-20 (×2): 2 [IU] via SUBCUTANEOUS
  Administered 2022-07-20: 5 [IU] via SUBCUTANEOUS
  Administered 2022-07-20: 3 [IU] via SUBCUTANEOUS
  Administered 2022-07-20: 5 [IU] via SUBCUTANEOUS
  Administered 2022-07-21: 3 [IU] via SUBCUTANEOUS
  Administered 2022-07-21: 5 [IU] via SUBCUTANEOUS
  Administered 2022-07-21: 3 [IU] via SUBCUTANEOUS
  Administered 2022-07-21: 5 [IU] via SUBCUTANEOUS
  Administered 2022-07-22 (×4): 3 [IU] via SUBCUTANEOUS
  Administered 2022-07-23: 5 [IU] via SUBCUTANEOUS
  Administered 2022-07-23: 3 [IU] via SUBCUTANEOUS

## 2022-07-15 MED ORDER — GUAIFENESIN-DM 100-10 MG/5ML PO SYRP
5.0000 mL | ORAL_SOLUTION | ORAL | Status: DC | PRN
  Administered 2022-07-15 – 2022-07-16 (×2): 5 mL via ORAL
  Filled 2022-07-15 (×2): qty 10

## 2022-07-15 NOTE — Progress Notes (Addendum)
NAME:  Zachary Briggs, MRN:  [id number], DOB:  06/27/1955, LOS: 5 ADMISSION DATE:  07/10/2022, CONSULTATION DATE:  5/2 REFERRING MD:  Rancour, CHIEF COMPLAINT:  respiratory failure   History of Present Illness:  This is a 67 year old male patient with history as mentioned below.  He is service-connected, retired from [employer].  Has a significant history of COPD, as well as diastolic heart failure, aortic valve stenosis, and polysubstance abuse including cocaine nicotine and alcohol.  States he was in his usual state of health up until 5/1 when he began to develop rather acute onset of shortness of breath and cough.  He describes the cough is productive of clear sputum.  He denied sick exposure, denied chest pain, denies nasal congestion, sore throat, wheezing.  Stopped smoking this a.m. on admission 5/2, last smoked cocaine on 5/1.  Patient called EMS today, on EMS arrival pulse oximetry 35% on room air placed on CPAP only improving to 65%.  En route he was given IV Solu-Medrol, IV magnesium, supplemental oxygen.  On presentation chest x-ray was obtained shows bilateral airspace disease with more dense consolidation in the right base.  Placed on NIPPV requiring escalation of PEEP up to 10 to maintain pulse oximetry greater than 90%.  Critical care asked admit given respiratory distress  Pertinent  Medical History  Aortic valve stenosis, BPH, cannabis dependence, prostate cancer, cocaine dependence, essential hypertension, GERD, headache, hepatitis C, back pain, mixed anxiety and depression disorder, heart murmur, nicotine dependence, nonsustained VT, SVT, history of pulmonary nodule, grade 1 diastolic dysfunction  Significant Hospital Events: Including procedures, antibiotic start and stop dates in addition to other pertinent events   5/2 admitted with acute respiratory failure secondary to mixed picture of acute decompensated heart failure and pneumonia versus perhaps drug-induced pneumonitis.   Started on noninvasive positive pressure ventilation, IV ceftriaxone, azithromycin, and hydrocortisone admitted to ICU  5/3 variable NPPV compliance. Weaning FiO2 on BiPAP -->  HHFNC 5/4 cont to wean FiO2  5/5 adv diet, diuresing   Interim History / Subjective:  This AM on HHF 93%/50L. Wore BiPAP overnight.   Objective   Blood pressure (!) 132/93, pulse 96, temperature 97.9 F (36.6 C), temperature source Axillary, resp. rate (!) 22, height 5\' 5"  (1.651 m), weight 81.6 kg, SpO2 95 %.    Vent Mode: PCV;BIPAP FiO2 (%):  [80 %-100 %] 94 % Set Rate:  [14 bmp] 14 bmp PEEP:  [8 cmH20] 8 cmH20   Intake/Output Summary (Last 24 hours) at 07/15/2022 1115 Last data filed at 07/15/2022 0900 Gross per 24 hour  Intake 2315.8 ml  Output 4350 ml  Net -2034.2 ml   Filed Weights   07/12/22 0444 07/13/22 0500 07/14/22 0500  Weight: 83.8 kg 84.1 kg 81.6 kg    Examination: General: Chronically ill appearing adult male, lying in bed, no distress  Neuro: Alert, oriented, follows commands, seems to be heard of hearing  HENT: Dry MM  Lungs: no use of accessory muscles, crackles to bases, no wheeze  Cardiovascular: RRR, HR 89, no mRG GI: soft, non-tender, active bowel sounds  GU: external cath Extremities: -edema    Resolved Hospital Problem list     Assessment & Plan:   Acute hypoxic resp failure in setting of pulmonary edema, CAP vs pneumonitis  Emphysema -ongoing crack/cocaine smoker  -MRSA PCR, Legionella, Strep, RVP negative  Plan -Wean O2 as able goal >88% -- (currently on 93%/50L)  -BiPAP at HS  -Continue Unasyn  -Continue Solu-medrol > decreased to  40 5/6 > plan for 3 additional days  -Continue to diuresis with goal net negative 1-2L per day  AoC diastolic HF -ECHO 5/3 > EF 60-65, moderate LVH, G1DD Moderate AS HTN HLD Plan -Cardiac Monitoring  -Patient stated does not take ASA at home >> [facility] stopped  -Lasix 40 TID >> awaiting AM labs, if stable will increase and add  metolazone  -Restart home Coreg    -lipitor -PRN hydral SBP goal < 180   Hypokalemia Hypermagnesemia  Plan -K and Mag pending >> will repeat if Low   Substance abuse  Chronic pain Anxiety  -ongoing crack/cocaine smoking several times/wk  P -cessation support  -holding home gabapentin, baclofen  -cont zoloft >> Need to have wife bring in bottles, med list at [facility] is different then here  -PRN zyprexa   Elevated LFT -patient with no ABD pain  P -Repeat LFT pending   GERD P -Continue home PPI  Hyperglycemia Plan -SSI   Hx Hep C  Plan -no acute intervention   Best Practice (right click and "Reselect all SmartList Selections" daily)   Diet/type: Carb Diet. 1500 fluid restriction  DVT prophylaxis: prophylactic heparin  GI prophylaxis: PPI Code Status:  full code Last date of multidisciplinary goals of care discussion [full]  Labs   CBC: Recent Labs  Lab 07/10/22 1305 07/10/22 1323 07/11/22 1205 07/12/22 0044 07/13/22 0015 07/14/22 0216 07/15/22 1019  WBC 19.6*   < > 18.8* 17.6* 14.7* 13.3* 14.3*  NEUTROABS 16.6*  --  16.9*  --   --   --   --   HGB 13.8   < > 13.4 12.5* 13.4 13.8 13.3  HCT 41.1   < > 40.9 38.2* 39.5 42.3 38.8*  MCV 79.3*   < > 79.6* 79.3* 77.5* 78.0* 76.8*  PLT 236   < > 225 239 234 243 292   < > = values in this interval not displayed.    Basic Metabolic Panel: Recent Labs  Lab 07/11/22 0226 07/11/22 0659 07/11/22 1205 07/12/22 0044 07/13/22 0015 07/14/22 0216  NA 139 139 139 138 139 140  K 3.6 3.5 3.6 3.3* 3.7 3.8  CL 107 105 104 104 106 102  CO2 20* 21* 21* 21* 23 23  GLUCOSE 185* 160* 146* 186* 146* 108*  BUN 17 19 21  25* 22 20  CREATININE 1.10 1.16 1.02 1.00 1.02 1.13  CALCIUM 8.4* 8.5* 8.5* 8.2* 8.3* 8.7*  MG 2.6*  --   --  2.7* 2.4  --   PHOS 2.9  --   --   --  3.3  --    GFR: Estimated Creatinine Clearance: 63.2 mL/min (by C-G formula based on SCr of 1.13 mg/dL). Recent Labs  Lab 07/10/22 1448 07/10/22 1545  07/10/22 1939 07/11/22 0226 07/12/22 0044 07/13/22 0015 07/14/22 0216 07/15/22 1019  PROCALCITON 1.09  --   --   --   --   --   --   --   WBC 18.5*  --   --    < > 17.6* 14.7* 13.3* 14.3*  LATICACIDVEN  --  1.4 1.3  --   --   --   --   --    < > = values in this interval not displayed.    Liver Function Tests: Recent Labs  Lab 07/10/22 1305 07/14/22 0216  AST 32 114*  ALT 30 283*  ALKPHOS 123 130*  BILITOT 1.2 0.9  PROT 7.4 7.6  ALBUMIN 3.1* 3.0*   Recent  Labs  Lab 07/10/22 1305  LIPASE 22   No results for input(s): "AMMONIA" in the last 168 hours.  ABG    Component Value Date/Time   PHART 7.404 07/10/2022 2007   PCO2ART 31.2 (L) 07/10/2022 2007   PO2ART 97 07/10/2022 2007   HCO3 19.5 (L) 07/10/2022 2007   TCO2 20 (L) 07/10/2022 2007   ACIDBASEDEF 4.0 (H) 07/10/2022 2007   O2SAT 98 07/10/2022 2007     Coagulation Profile: Recent Labs  Lab 07/10/22 1305  INR 1.2    Cardiac Enzymes: No results for input(s): "CKTOTAL", "CKMB", "CKMBINDEX", "TROPONINI" in the last 168 hours.  HbA1C: Hgb A1c MFr Bld  Date/Time Value Ref Range Status  07/10/2022 02:51 PM 6.4 (H) 4.8 - 5.6 % Final    Comment:    (NOTE) Pre diabetes:          5.7%-6.4%  Diabetes:              >6.4%  Glycemic control for   <7.0% adults with diabetes   07/16/2021 11:49 AM 6.6 (H) 4.8 - 5.6 % Final    Comment:    (NOTE) Pre diabetes:          5.7%-6.4%  Diabetes:              >6.4%  Glycemic control for   <7.0% adults with diabetes     CBG: Recent Labs  Lab 07/14/22 1630 07/14/22 2051 07/14/22 2353 07/15/22 0335 07/15/22 0717  GLUCAP 284* 193* 87 115* 147*    CRITICAL CARE Performed by: Tobey Grim   Total critical care time: 32 minutes  Critical care time was exclusive of separately billable procedures and treating other patients. Critical care was necessary to treat or prevent imminent or life-threatening deterioration.  Critical care was time spent  personally by me on the following activities: development of treatment plan with patient and/or surrogate as well as nursing, discussions with consultants, evaluation of patient's response to treatment, examination of patient, obtaining history from patient or surrogate, ordering and performing treatments and interventions, ordering and review of laboratory studies, ordering and review of radiographic studies, pulse oximetry and re-evaluation of patient's condition.  Jovita Kussmaul, AGACNP-BC [facility]  PCCM Pgr: [phone]

## 2022-07-15 NOTE — Progress Notes (Signed)
CPT held due to pt being on BIPAP at this time

## 2022-07-16 ENCOUNTER — Inpatient Hospital Stay (HOSPITAL_COMMUNITY): Payer: Medicare HMO

## 2022-07-16 DIAGNOSIS — J9601 Acute respiratory failure with hypoxia: Secondary | ICD-10-CM | POA: Diagnosis not present

## 2022-07-16 LAB — PHOSPHORUS: Phosphorus: 5 mg/dL — ABNORMAL HIGH (ref 2.5–4.6)

## 2022-07-16 LAB — BASIC METABOLIC PANEL
Anion gap: 15 (ref 5–15)
BUN: 36 mg/dL — ABNORMAL HIGH (ref 8–23)
CO2: 27 mmol/L (ref 22–32)
Calcium: 9.9 mg/dL (ref 8.9–10.3)
Chloride: 94 mmol/L — ABNORMAL LOW (ref 98–111)
Creatinine, Ser: 1.41 mg/dL — ABNORMAL HIGH (ref 0.61–1.24)
GFR, Estimated: 55 mL/min — ABNORMAL LOW (ref >60)
Glucose, Bld: 117 mg/dL — ABNORMAL HIGH (ref 70–99)
Potassium: 3.7 mmol/L (ref 3.5–5.1)
Sodium: 136 mmol/L (ref 135–145)

## 2022-07-16 LAB — CBC
HCT: 41.9 % (ref 39.0–52.0)
Hemoglobin: 14.3 g/dL (ref 13.0–17.0)
MCH: 26.1 pg (ref 26.0–34.0)
MCHC: 34.1 g/dL (ref 30.0–36.0)
MCV: 76.5 fL — ABNORMAL LOW (ref 80.0–100.0)
Platelets: 332 10*3/uL (ref 150–400)
RBC: 5.48 MIL/uL (ref 4.22–5.81)
RDW: 16.6 % — ABNORMAL HIGH (ref 11.5–15.5)
WBC: 19.6 10*3/uL — ABNORMAL HIGH (ref 4.0–10.5)
nRBC: 0 % (ref 0.0–0.2)

## 2022-07-16 LAB — GLUCOSE, CAPILLARY
Glucose-Capillary: 143 mg/dL — ABNORMAL HIGH (ref 70–99)
Glucose-Capillary: 174 mg/dL — ABNORMAL HIGH (ref 70–99)
Glucose-Capillary: 169 mg/dL — ABNORMAL HIGH (ref 70–99)
Glucose-Capillary: 131 mg/dL — ABNORMAL HIGH (ref 70–99)

## 2022-07-16 LAB — MAGNESIUM: Magnesium: 2.5 mg/dL — ABNORMAL HIGH (ref 1.7–2.4)

## 2022-07-16 MED ORDER — POTASSIUM CHLORIDE CRYS ER 20 MEQ PO TBCR
40.0000 meq | EXTENDED_RELEASE_TABLET | Freq: Once | ORAL | Status: AC
  Administered 2022-07-16: 40 meq via ORAL
  Filled 2022-07-16: qty 2

## 2022-07-16 MED ORDER — GUAIFENESIN ER 600 MG PO TB12
600.0000 mg | ORAL_TABLET | Freq: Two times a day (BID) | ORAL | Status: DC
  Administered 2022-07-16 – 2022-07-23 (×15): 600 mg via ORAL
  Filled 2022-07-16 (×15): qty 1

## 2022-07-16 NOTE — Progress Notes (Signed)
[facility] ADULT ICU REPLACEMENT PROTOCOL   The patient does apply for the [facility] Adult ICU Electrolyte Replacment Protocol based on the criteria listed below:   1.Exclusion criteria: TCTS, ECMO, Dialysis, and Myasthenia Gravis patients 2. Is GFR >/= 30 ml/min? Yes.    Patient's GFR today is 55 3. Is SCr </= 2? Yes.   Patient's SCr is 1.41 mg/dL 4. Did SCr increase >/= 0.5 in 24 hours? No. 5.Pt's weight >40kg  Yes.   6. Abnormal electrolyte(s):   K 3.7  7. Electrolytes replaced per protocol 8.  Call MD STAT for K+ </= 2.5, Phos </= 1, or Mag </= 1 Physician:  A. Doree Fudge Kagen Kunath 07/16/2022 5:48 AM

## 2022-07-16 NOTE — TOC Progression Note (Signed)
Transition of Care [facility]) - Initial/Assessment Note    Patient Details  Name: Zachary Briggs MRN: [id number] Date of Birth: [birth year]  Transition of [facility]) CM/SW Contact:    Ralene Bathe, LCSWA Phone Number: 07/16/2022, 2:52 PM  Clinical Narrative:                 LCSW contacted the [facility] ([place]) and was informed that the patient is 70% service connected.  LTACH representative informed.  TOC following.   Expected Discharge Plan: Long Term Acute Care (LTAC) Barriers to Discharge: Continued Medical Work up, English as a second language teacher   Patient Goals and CMS Choice Patient states their goals for this hospitalization and ongoing recovery are:: To return home when medically ready CMS Medicare.gov Compare Post Acute Care list provided to:: Patient Choice offered to / list presented to : Patient      Expected Discharge Plan and Services In-house Referral: Clinical Social Work   Post Acute Care Choice: Long Term Acute Care (LTAC) Living arrangements for the past 2 months: Single Family Home                                      Prior Living Arrangements/Services Living arrangements for the past 2 months: Single Family Home Lives with:: Spouse Patient language and need for interpreter reviewed:: Yes Do you feel safe going back to the place where you live?: Yes      Need for Family Participation in Patient Care: Yes (Comment) Care giver support system in place?: Yes (comment)   Criminal Activity/Legal Involvement Pertinent to Current Situation/Hospitalization: No - Comment as needed  Activities of Daily Living      Permission Sought/Granted   Permission granted to share information with : Yes, Release of Information Signed     Permission granted to share info w AGENCY: LTACHs        Emotional Assessment Appearance:: Appears older than stated age Attitude/Demeanor/Rapport: Ambitious Affect (typically observed): Adaptable Orientation: : Oriented to  Situation, Oriented to  Time, Oriented to Place, Oriented to Self Alcohol / Substance Use: Not Applicable Psych Involvement: No (comment)  Admission diagnosis:  Acute respiratory failure (HCC) [J96.00] Acute respiratory failure with hypoxia (HCC) [J96.01] Patient Active Problem List   Diagnosis Date Noted   Lung problems from crack cocaine (HCC) 07/13/2022   Hypokalemia 07/12/2022   Crack cocaine use 07/11/2022   Acute on chronic diastolic heart failure (HCC) 07/11/2022   Acute respiratory failure (HCC) 07/10/2022   Weakness acquired in ICU 07/29/2021   Acute encephalopathy    Community acquired pneumonia    Hypernatremia 07/21/2021   ARDS (adult respiratory distress syndrome) (HCC) 07/20/2021   Acute on chronic respiratory failure with hypoxia and hypercapnia (HCC) 07/20/2021   Obesity (BMI 30-39.9) 07/20/2021   Type 2 diabetes mellitus with hyperglycemia (HCC) 07/20/2021   Aortic stenosis 12/30/2015   Aortic atherosclerosis (HCC) 12/29/2015   Bulla of lung (HCC) 12/29/2015   Chest pain 12/28/2015   Back pain 12/28/2015   Hypertension    Tobacco abuse    PCP:  [facility]:   [id number] [place], [place] - [address] [place] [address] Phone: [phone] Fax: [phone]  [facility] [place], [place] - [address] [place] [address] Phone: [phone] Fax: [phone]     Social Determinants of Health (SDOH) Social History: SDOH  Screenings   Tobacco Use: High Risk (07/10/2022)   SDOH Interventions:     Readmission Risk Interventions    07/31/2021    1:32 PM  Readmission Risk Prevention Plan  Transportation Screening Complete  PCP or Specialist Appt within 3-5 Days Complete  HRI or Home Care Consult Complete  Social Work Consult for Recovery Care Planning/Counseling Complete  Palliative Care Screening Not Applicable  Medication Review Oceanographer) Referral to  Pharmacy

## 2022-07-16 NOTE — Progress Notes (Signed)
NAME:  Zachary Briggs, MRN:  [id number], DOB:  05/23/1955, LOS: 6 ADMISSION DATE:  07/10/2022, CONSULTATION DATE:  5/2 REFERRING MD:  Rancour, CHIEF COMPLAINT:  respiratory failure   History of Present Illness:  This is a 67 year old male patient with history as mentioned below.  He is service-connected, retired from [employer].  Has a significant history of COPD, as well as diastolic heart failure, aortic valve stenosis, and polysubstance abuse including cocaine nicotine and alcohol.  States he was in his usual state of health up until 5/1 when he began to develop rather acute onset of shortness of breath and cough.  He describes the cough is productive of clear sputum.  He denied sick exposure, denied chest pain, denies nasal congestion, sore throat, wheezing.  Stopped smoking this a.m. on admission 5/2, last smoked cocaine on 5/1.  Patient called EMS today, on EMS arrival pulse oximetry 35% on room air placed on CPAP only improving to 65%.  En route he was given IV Solu-Medrol, IV magnesium, supplemental oxygen.  On presentation chest x-ray was obtained shows bilateral airspace disease with more dense consolidation in the right base.  Placed on NIPPV requiring escalation of PEEP up to 10 to maintain pulse oximetry greater than 90%.  Critical care asked admit given respiratory distress  Pertinent  Medical History  Aortic valve stenosis, BPH, cannabis dependence, prostate cancer, cocaine dependence, essential hypertension, GERD, headache, hepatitis C, back pain, mixed anxiety and depression disorder, heart murmur, nicotine dependence, nonsustained VT, SVT, history of pulmonary nodule, grade 1 diastolic dysfunction  Significant Hospital Events: Including procedures, antibiotic start and stop dates in addition to other pertinent events   5/2 admitted with acute respiratory failure secondary to mixed picture of acute decompensated heart failure and pneumonia versus perhaps drug-induced pneumonitis.   Started on noninvasive positive pressure ventilation, IV ceftriaxone, azithromycin, and hydrocortisone admitted to ICU  5/3 variable NPPV compliance. Weaning FiO2 on BiPAP -->  HHFNC 5/4 cont to wean FiO2  5/5 adv diet, diuresing   Interim History / Subjective:  Briefly on BiPAP overnight, prefers HHFNC due to dryness, feeling better, decreasing productive cough, feels lightheaded after severe coughing spells Diuresing but remains on HHFNC 35L, and 80-100% for sats 88-94%  Objective   Blood pressure (!) 127/96, pulse 82, temperature 98.9 F (37.2 C), temperature source Oral, resp. rate (!) 22, height 5\' 5"  (1.651 m), weight 79.1 kg, SpO2 92 %.    Vent Mode: PCV;BIPAP FiO2 (%):  [80 %-100 %] 95 % Set Rate:  [14 bmp] 14 bmp PEEP:  [8 cmH20] 8 cmH20   Intake/Output Summary (Last 24 hours) at 07/16/2022 0818 Last data filed at 07/16/2022 0700 Gross per 24 hour  Intake 2229.35 ml  Output 2950 ml  Net -720.65 ml   Filed Weights   07/13/22 0500 07/14/22 0500 07/16/22 0500  Weight: 84.1 kg 81.6 kg 79.1 kg    Examination: General:  older adult male sitting upright in bed in NAD HEENT: MM pink/moist Neuro:  Alert, oriented, MAE CV: rr, NSR, +murmur PULM:  non labored, speaking full sentences, clear anteriorly, bibasilar crackles, no wheeze, productive tan sputum GI: soft, bs+, NT Extremities: warm/dry, no LE edema  Skin: no rashes   Labs and imaging reviewed   Resolved Hospital Problem list     Assessment & Plan:   Acute hypoxic resp failure in setting of pulmonary edema, CAP vs drug induced pneumonitis  Emphysema -ongoing crack/cocaine smoker  -MRSA PCR, Legionella, Strep, RVP negative, CTA neg  PE Plan - continues to have high O2 requirement despite diuresis, steroids, and abx.  Has been on VTE ppx  Discussed with attending, continue current course for now, may be slow to improve - cont ICU monitoring for now given high O2 requirement - repeat CXR, consider repeat CT - cont  HHFNC> cont to wean FiO2 for sat goal > 88% - prn BiPAP for WOB - day 3x/ unasyn, cont for now - WBC up slightly but remains afebrile> sputum amount improving per pt - cont solumedrol 40mg   - duonebs q 6hrs - daily assessment of diuretics  - changed to scheduled mucinex, flutter, cont IS, mobilize - PT consult  AoC diastolic HF -ECHO 5/3 > EF 60-65, moderate LVH, G1DD Moderate AS HTN HLD Plan - Patient stated does not take ASA at home >> [facility] stopped  - cont tele monitoring - net -7.5L, slight bump in sCr > hold further diuretics today, looks euvolemic, s/p lasix 40mg  this am - cont Coreg, lipitor  - PRN hydral SBP goal < 180   Hypokalemia Hypermagnesemia  Plan - s/p KCL replete, trend on BMET - replete prn  Substance abuse  Chronic pain- back Anxiety  -ongoing crack/cocaine smoking several times/wk  P - cessation support  - holding home gabapentin, baclofen > not on [facility] list, is on lyrica.  Cont to hold/ consider  - prn tylenol, vicodin - cont zoloft >> Need to have wife bring in bottles, med list at [facility] is different then here  - PRN zyprexa qhs    Elevated LFT -patient with no ABD pain  P - decreasing, trend prn  GERD P - PPI  Hyperglycemia Plan - cont SSI, remains controlled  Hx Hep C  Plan -no acute intervention   Best Practice (right click and "Reselect all SmartList Selections" daily)   Diet/type: Carb Diet. 1500 fluid restriction  DVT prophylaxis: prophylactic heparin  GI prophylaxis: PPI Code Status:  full code Last date of multidisciplinary goals of care discussion [full]  Labs   CBC: Recent Labs  Lab 07/10/22 1305 07/10/22 1323 07/11/22 1205 07/12/22 0044 07/13/22 0015 07/14/22 0216 07/15/22 1019 07/16/22 0123  WBC 19.6*   < > 18.8* 17.6* 14.7* 13.3* 14.3* 19.6*  NEUTROABS 16.6*  --  16.9*  --   --   --   --   --   HGB 13.8   < > 13.4 12.5* 13.4 13.8 13.3 14.3  HCT 41.1   < > 40.9 38.2* 39.5 42.3 38.8* 41.9  MCV 79.3*   < > 79.6*  79.3* 77.5* 78.0* 76.8* 76.5*  PLT 236   < > 225 239 234 243 292 332   < > = values in this interval not displayed.    Basic Metabolic Panel: Recent Labs  Lab 07/11/22 0226 07/11/22 0659 07/12/22 0044 07/13/22 0015 07/14/22 0216 07/15/22 1019 07/16/22 0123  NA 139   < > 138 139 140 140 136  K 3.6   < > 3.3* 3.7 3.8 4.1 3.7  CL 107   < > 104 106 102 99 94*  CO2 20*   < > 21* 23 23 24 27   GLUCOSE 185*   < > 186* 146* 108* 184* 117*  BUN 17   < > 25* 22 20 26* 36*  CREATININE 1.10   < > 1.00 1.02 1.13 1.24 1.41*  CALCIUM 8.4*   < > 8.2* 8.3* 8.7* 10.1 9.9  MG 2.6*  --  2.7* 2.4  --  2.4 2.5*  PHOS 2.9  --   --  3.3  --  4.7* 5.0*   < > = values in this interval not displayed.   GFR: Estimated Creatinine Clearance: 49.9 mL/min (A) (by C-G formula based on SCr of 1.41 mg/dL (H)). Recent Labs  Lab 07/10/22 1448 07/10/22 1545 07/10/22 1939 07/11/22 0226 07/13/22 0015 07/14/22 0216 07/15/22 1019 07/16/22 0123  PROCALCITON 1.09  --   --   --   --   --   --   --   WBC 18.5*  --   --    < > 14.7* 13.3* 14.3* 19.6*  LATICACIDVEN  --  1.4 1.3  --   --   --   --   --    < > = values in this interval not displayed.    Liver Function Tests: Recent Labs  Lab 07/10/22 1305 07/14/22 0216 07/15/22 1054  AST 32 114* 54*  ALT 30 283* 165*  ALKPHOS 123 130* 101  BILITOT 1.2 0.9 1.2  PROT 7.4 7.6 8.7*  ALBUMIN 3.1* 3.0* 4.6   Recent Labs  Lab 07/10/22 1305  LIPASE 22   No results for input(s): "AMMONIA" in the last 168 hours.  ABG    Component Value Date/Time   PHART 7.404 07/10/2022 2007   PCO2ART 31.2 (L) 07/10/2022 2007   PO2ART 97 07/10/2022 2007   HCO3 19.5 (L) 07/10/2022 2007   TCO2 20 (L) 07/10/2022 2007   ACIDBASEDEF 4.0 (H) 07/10/2022 2007   O2SAT 98 07/10/2022 2007     Coagulation Profile: Recent Labs  Lab 07/10/22 1305  INR 1.2    Cardiac Enzymes: No results for input(s): "CKTOTAL", "CKMB", "CKMBINDEX", "TROPONINI" in the last 168  hours.  HbA1C: Hgb A1c MFr Bld  Date/Time Value Ref Range Status  07/10/2022 02:51 PM 6.4 (H) 4.8 - 5.6 % Final    Comment:    (NOTE) Pre diabetes:          5.7%-6.4%  Diabetes:              >6.4%  Glycemic control for   <7.0% adults with diabetes   07/16/2021 11:49 AM 6.6 (H) 4.8 - 5.6 % Final    Comment:    (NOTE) Pre diabetes:          5.7%-6.4%  Diabetes:              >6.4%  Glycemic control for   <7.0% adults with diabetes     CBG: Recent Labs  Lab 07/15/22 0717 07/15/22 1126 07/15/22 1521 07/15/22 2101 07/16/22 0723  GLUCAP 147* 181* 162* 123* 143*    CRITICAL CARE Performed by: Posey Boyer   Total critical care time: 38 minutes  Critical care time was exclusive of separately billable procedures and treating other patients. Critical care was necessary to treat or prevent imminent or life-threatening deterioration.  Critical care was time spent personally by me on the following activities: development of treatment plan with patient and/or surrogate as well as nursing, discussions with consultants, evaluation of patient's response to treatment, examination of patient, obtaining history from patient or surrogate, ordering and performing treatments and interventions, ordering and review of laboratory studies, ordering and review of radiographic studies, pulse oximetry and re-evaluation of patient's condition.     Posey Boyer, MSN, AG-ACNP-BC [facility] 07/16/2022, 8:18 AM  See Amion for pager If no response to pager, please call PCCM consult pager After 7:00 pm call Elink

## 2022-07-16 NOTE — Evaluation (Signed)
Physical Therapy Evaluation Patient Details Name: Zachary Briggs MRN: [id number] DOB: Dec 24, 1955 Today's Date: 07/16/2022  History of Present Illness  Pt is 67 year old presented to [facility] on  07/10/22 for acute hypoxemic respiratory failure with bilateral pulmonary infiltrates most consistent with cocaine induced lung injury. PMH - chf, COPD, HTN, PTSD, substance abuse, aortic valve stenosis, ARDS  Clinical Impression  Pt presents to PT with slightly unsteady gait due to illness and inactivity. Expect pt will make good progress back to baseline with mobility. Will follow acutely but doubt pt will need PT after DC.         Recommendations for follow up therapy are one component of a multi-disciplinary discharge planning process, led by the attending physician.  Recommendations may be updated based on patient status, additional functional criteria and insurance authorization.  Follow Up Recommendations       Assistance Recommended at Discharge Intermittent Supervision/Assistance  Patient can return home with the following       Equipment Recommendations None recommended by PT  Recommendations for Other Services       Functional Status Assessment Patient has had a recent decline in their functional status and demonstrates the ability to make significant improvements in function in a reasonable and predictable amount of time.     Precautions / Restrictions Precautions Precautions: Other (comment) Precaution Comments: HHFNC      Mobility  Bed Mobility Overal bed mobility: Needs Assistance Bed Mobility: Supine to Sit     Supine to sit: Min assist     General bed mobility comments: Assist to pull up on hand to elevate trunk into sitting    Transfers Overall transfer level: Needs assistance Equipment used: 1 person hand held assist Transfers: Sit to/from Stand Sit to Stand: Min assist           General transfer comment: assist for initial stability     Ambulation/Gait Ambulation/Gait assistance: Min guard Gait Distance (Feet): 12 Feet (forward/backward) Assistive device: 1 person hand held assist Gait Pattern/deviations: Step-through pattern, Decreased stride length Gait velocity: decr     General Gait Details: Assist for safety and lines  Stairs            Wheelchair Mobility    Modified Rankin (Stroke Patients Only)       Balance Overall balance assessment: Needs assistance Sitting-balance support: No upper extremity supported, Feet supported Sitting balance-Leahy Scale: Good     Standing balance support: No upper extremity supported Standing balance-Leahy Scale: Fair                               Pertinent Vitals/Pain Pain Assessment Pain Assessment: No/denies pain    Home Living Family/patient expects to be discharged to:: Private residence Living Arrangements: Spouse/significant other Available Help at Discharge: Family;Available 24 hours/day Type of Home: House Home Access: Stairs to enter Entrance Stairs-Rails: Right Entrance Stairs-Number of Steps: 3   Home Layout: One level Home Equipment: Agricultural consultant (2 wheels);Rollator (4 wheels);Cane - single point      Prior Function Prior Level of Function : Independent/Modified Independent             Mobility Comments: Uses cane primarily       Hand Dominance   Dominant Hand: Right    Extremity/Trunk Assessment   Upper Extremity Assessment Upper Extremity Assessment: Overall WFL for tasks assessed    Lower Extremity Assessment Lower Extremity Assessment: Generalized weakness  Communication   Communication: No difficulties  Cognition Arousal/Alertness: Awake/alert Behavior During Therapy: WFL for tasks assessed/performed Overall Cognitive Status: Within Functional Limits for tasks assessed                                          General Comments General comments (skin integrity, edema,  etc.): Pt on HHFNC at 35L. SpO2 mid 80's with amb. Mid 90's at rest    Exercises     Assessment/Plan    PT Assessment Patient needs continued PT services  PT Problem List Decreased strength;Decreased activity tolerance;Decreased balance;Decreased mobility;Cardiopulmonary status limiting activity       PT Treatment Interventions DME instruction;Gait training;Stair training;Functional mobility training;Therapeutic activities;Therapeutic exercise;Balance training;Patient/family education    PT Goals (Current goals can be found in the Care Plan section)  Acute Rehab PT Goals Patient Stated Goal: return home PT Goal Formulation: With patient Time For Goal Achievement: 07/30/22 Potential to Achieve Goals: Good    Frequency Min 3X/week     Co-evaluation               AM-PAC PT "6 Clicks" Mobility  Outcome Measure Help needed turning from your back to your side while in a flat bed without using bedrails?: None Help needed moving from lying on your back to sitting on the side of a flat bed without using bedrails?: A Little Help needed moving to and from a bed to a chair (including a wheelchair)?: A Little Help needed standing up from a chair using your arms (e.g., wheelchair or bedside chair)?: A Little Help needed to walk in hospital room?: A Little Help needed climbing 3-5 steps with a railing? : A Little 6 Click Score: 19    End of Session Equipment Utilized During Treatment: Oxygen Activity Tolerance: Patient tolerated treatment well Patient left: in chair;with call bell/phone within reach;with family/visitor present   PT Visit Diagnosis: Other abnormalities of gait and mobility (R26.89);Muscle weakness (generalized) (M62.81)    Time: 2952-8413 PT Time Calculation (min) (ACUTE ONLY): 17 min   Charges:   PT Evaluation $PT Eval Moderate Complexity: 1 Mod          [facility] PT Acute Rehabilitation Services Office [phone]   Angelina Ok [facility] 07/16/2022,  2:26 PM

## 2022-07-17 DIAGNOSIS — J9601 Acute respiratory failure with hypoxia: Secondary | ICD-10-CM | POA: Diagnosis not present

## 2022-07-17 DIAGNOSIS — J984 Other disorders of lung: Secondary | ICD-10-CM | POA: Diagnosis not present

## 2022-07-17 DIAGNOSIS — R918 Other nonspecific abnormal finding of lung field: Secondary | ICD-10-CM | POA: Diagnosis not present

## 2022-07-17 LAB — MAGNESIUM: Magnesium: 2.6 mg/dL — ABNORMAL HIGH (ref 1.7–2.4)

## 2022-07-17 LAB — BASIC METABOLIC PANEL
Anion gap: 11 (ref 5–15)
BUN: 44 mg/dL — ABNORMAL HIGH (ref 8–23)
CO2: 24 mmol/L (ref 22–32)
Calcium: 9.2 mg/dL (ref 8.9–10.3)
Chloride: 99 mmol/L (ref 98–111)
Creatinine, Ser: 1.49 mg/dL — ABNORMAL HIGH (ref 0.61–1.24)
GFR, Estimated: 51 mL/min — ABNORMAL LOW (ref >60)
Glucose, Bld: 124 mg/dL — ABNORMAL HIGH (ref 70–99)
Potassium: 3.8 mmol/L (ref 3.5–5.1)
Sodium: 134 mmol/L — ABNORMAL LOW (ref 135–145)

## 2022-07-17 LAB — CBC
HCT: 41.3 % (ref 39.0–52.0)
Hemoglobin: 13.9 g/dL (ref 13.0–17.0)
MCH: 25.9 pg — ABNORMAL LOW (ref 26.0–34.0)
MCHC: 33.7 g/dL (ref 30.0–36.0)
MCV: 76.9 fL — ABNORMAL LOW (ref 80.0–100.0)
Platelets: 372 10*3/uL (ref 150–400)
RBC: 5.37 MIL/uL (ref 4.22–5.81)
RDW: 17 % — ABNORMAL HIGH (ref 11.5–15.5)
WBC: 19.8 10*3/uL — ABNORMAL HIGH (ref 4.0–10.5)
nRBC: 0 % (ref 0.0–0.2)

## 2022-07-17 LAB — GLUCOSE, CAPILLARY
Glucose-Capillary: 118 mg/dL — ABNORMAL HIGH (ref 70–99)
Glucose-Capillary: 166 mg/dL — ABNORMAL HIGH (ref 70–99)
Glucose-Capillary: 235 mg/dL — ABNORMAL HIGH (ref 70–99)
Glucose-Capillary: 129 mg/dL — ABNORMAL HIGH (ref 70–99)

## 2022-07-17 LAB — PHOSPHORUS: Phosphorus: 4.5 mg/dL (ref 2.5–4.6)

## 2022-07-17 MED ORDER — IPRATROPIUM-ALBUTEROL 0.5-2.5 (3) MG/3ML IN SOLN: 3.0000 mL | RESPIRATORY_TRACT | Status: DC | PRN

## 2022-07-17 MED ORDER — PREDNISONE 20 MG PO TABS
20.0000 mg | ORAL_TABLET | Freq: Every day | ORAL | Status: DC
  Administered 2022-07-18: 20 mg via ORAL
  Filled 2022-07-17: qty 1

## 2022-07-17 NOTE — Progress Notes (Signed)
NAME:  Zachary Briggs, MRN:  [id number], DOB:  [birth year], LOS: 7 ADMISSION DATE:  07/10/2022, CONSULTATION DATE:  5/2 REFERRING MD:  Rancour, CHIEF COMPLAINT:  respiratory failure   History of Present Illness:  67 yo male smoker developed sudden onset of dyspnea and cough with clear sputum.  Hx of substance abuse and last smoked cocaine on 07/09/22.  SpO2 on room air in ER was 35%.  CXR showed b/l diffuse infiltrates and Rt base consolidation.  PCCM asked to admit to ICU.  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  Pertinent  Medical History  Aortic valve stenosis, BPH, cannabis dependence, prostate cancer, cocaine dependence, essential hypertension, GERD, headache, hepatitis C, back pain, mixed anxiety and depression disorder, heart murmur, nicotine dependence, nonsustained VT, SVT, history of pulmonary nodule, grade 1 diastolic dysfunction  Significant Hospital Events: Including procedures, antibiotic start and stop dates in addition to other pertinent events   5/2 admitted with acute respiratory failure secondary to mixed picture of acute decompensated heart failure and pneumonia versus perhaps drug-induced pneumonitis.  Started on noninvasive positive pressure ventilation, IV ceftriaxone, azithromycin, and hydrocortisone admitted to ICU  5/3 variable NPPV compliance. Weaning FiO2 on BiPAP -->  HHFNC 5/4 cont to wean FiO2  5/5 adv diet, diuresing  5/9 transfer to progressive care  Interim History / Subjective:  Breathing better.  Not having much cough.  Denies chest pain or abdominal pain.  Was able to walk in his room some yesterday.  Objective   Blood pressure (!) 142/90, pulse 89, temperature 98.6 F (37 C), temperature source Oral, resp. rate (!) 21, height 5\' 5"  (1.651 m), weight 80.1 kg, SpO2 93 %.    Vent Mode: PCV;BIPAP FiO2 (%):  [80 %-96 %] 90 % Set Rate:  [14 bmp] 14 bmp PEEP:  [8 cmH20] 8 cmH20   Intake/Output Summary (Last 24 hours) at 07/17/2022  0742 Last data filed at 07/17/2022 0600 Gross per 24 hour  Intake 1615.87 ml  Output 1650 ml  Net -34.13 ml   Filed Weights   07/14/22 0500 07/16/22 0500 07/17/22 0500  Weight: 81.6 kg 79.1 kg 80.1 kg    Examination:  General - alert Eyes - pupils reactive ENT - no sinus tenderness, no stridor Cardiac - regular rate/rhythm, 3/6 SM Chest - equal breath sounds b/l, no wheezing or rales Abdomen - soft, non tender, + bowel sounds Extremities - no cyanosis, clubbing, or edema Skin - no rashes Neuro - normal strength, moves extremities, follows commands Psych - normal mood and behavior  Resolved Hospital Problem list   Hypokalemia, Hypermagnesemia, Elevated LFTs in setting of hypoxia  Assessment & Plan:   Acute hypoxic respiratory failure with pulmonary infiltrates. - likely from cocaine induce lung injury with acute pulmonary edema and possibly community acquired pneumonia - improving - adjust oxygen to keep SpO2 > 90% - f/u CXR intermittently - bronchial hygiene - change solumedrol to prednisone 20 mg daily on 5/10 and wean off as able - even fluid balance - completed ABx on 5/08  Tobacco abuse. - prn duoneb - assess for obstructive lung disease as an outpt  Acute on chronic HFpEF. Aortic stenosis. Hx of HTN, HLD. - Echo from 07/11/22 had poor window for aortic valve - f/u Echo as outpt - continue coreg, lipitor - goal BP normotensive  Anxiety, Chronic back pain, Cocaine abuse. - continue zoloft - hold outpt lyrica  Hx of GERD. - continue protonix  Steroid induced hyperglycemia. - SSI  Transfer to progressive care on 5/09.  Will ask [facility] to assume primary care on 5/10 and PCCM will follow as pulmonary consult.  Best Practice (right click and "Reselect all SmartList Selections" daily)  Diet/type: Carb Diet. 1500 fluid restriction  DVT prophylaxis: prophylactic heparin  GI prophylaxis: PPI Code Status:  full code Last date of multidisciplinary goals of  care discussion [full]  Labs       Latest Ref Rng & Units 07/17/2022   12:59 AM 07/16/2022    1:23 AM 07/15/2022   10:54 AM  CMP  Glucose 70 - 99 mg/dL 161  096    BUN 8 - 23 mg/dL 44  36    Creatinine 0.45 - 1.24 mg/dL 4.09  8.11    Sodium 914 - 145 mmol/L 134  136    Potassium 3.5 - 5.1 mmol/L 3.8  3.7    Chloride 98 - 111 mmol/L 99  94    CO2 22 - 32 mmol/L 24  27    Calcium 8.9 - 10.3 mg/dL 9.2  9.9    Total Protein 6.5 - 8.1 g/dL   8.7   Total Bilirubin 0.3 - 1.2 mg/dL   1.2   Alkaline Phos 38 - 126 U/L   101   AST 15 - 41 U/L   54   ALT 0 - 44 U/L   165        Latest Ref Rng & Units 07/17/2022   12:59 AM 07/16/2022    1:23 AM 07/15/2022   10:19 AM  CBC  WBC 4.0 - 10.5 K/uL 19.8  19.6  14.3   Hemoglobin 13.0 - 17.0 g/dL 78.2  95.6  21.3   Hematocrit 39.0 - 52.0 % 41.3  41.9  38.8   Platelets 150 - 400 K/uL 372  332  292    ABG    Component Value Date/Time   PHART 7.404 07/10/2022 2007   PCO2ART 31.2 (L) 07/10/2022 2007   PO2ART 97 07/10/2022 2007   HCO3 19.5 (L) 07/10/2022 2007   TCO2 20 (L) 07/10/2022 2007   ACIDBASEDEF 4.0 (H) 07/10/2022 2007   O2SAT 98 07/10/2022 2007    CBG (last 3)  Recent Labs    07/16/22 1627 07/16/22 2204 07/17/22 0718  GLUCAP 169* 131* 118*   Signature:  Coralyn Helling, MD [place] [facility] Pulmonary/Critical Care Pager - [phone] or [phone] 07/17/2022, 7:57 AM

## 2022-07-18 ENCOUNTER — Inpatient Hospital Stay (HOSPITAL_COMMUNITY): Payer: Medicare HMO

## 2022-07-18 DIAGNOSIS — R918 Other nonspecific abnormal finding of lung field: Secondary | ICD-10-CM

## 2022-07-18 DIAGNOSIS — J9601 Acute respiratory failure with hypoxia: Secondary | ICD-10-CM | POA: Diagnosis not present

## 2022-07-18 LAB — GLUCOSE, CAPILLARY
Glucose-Capillary: 209 mg/dL — ABNORMAL HIGH (ref 70–99)
Glucose-Capillary: 163 mg/dL — ABNORMAL HIGH (ref 70–99)
Glucose-Capillary: 201 mg/dL — ABNORMAL HIGH (ref 70–99)
Glucose-Capillary: 143 mg/dL — ABNORMAL HIGH (ref 70–99)

## 2022-07-18 LAB — CBC
HCT: 43.9 % (ref 39.0–52.0)
Hemoglobin: 15 g/dL (ref 13.0–17.0)
MCH: 26.5 pg (ref 26.0–34.0)
MCHC: 34.2 g/dL (ref 30.0–36.0)
MCV: 77.4 fL — ABNORMAL LOW (ref 80.0–100.0)
Platelets: 379 10*3/uL (ref 150–400)
RBC: 5.67 MIL/uL (ref 4.22–5.81)
RDW: 17.4 % — ABNORMAL HIGH (ref 11.5–15.5)
WBC: 19.7 10*3/uL — ABNORMAL HIGH (ref 4.0–10.5)
nRBC: 0 % (ref 0.0–0.2)

## 2022-07-18 LAB — MAGNESIUM: Magnesium: 2.4 mg/dL (ref 1.7–2.4)

## 2022-07-18 LAB — BASIC METABOLIC PANEL
Anion gap: 12 (ref 5–15)
BUN: 32 mg/dL — ABNORMAL HIGH (ref 8–23)
CO2: 21 mmol/L — ABNORMAL LOW (ref 22–32)
Calcium: 9.3 mg/dL (ref 8.9–10.3)
Chloride: 100 mmol/L (ref 98–111)
Creatinine, Ser: 1.06 mg/dL (ref 0.61–1.24)
GFR, Estimated: >60 mL/min (ref >60)
Glucose, Bld: 131 mg/dL — ABNORMAL HIGH (ref 70–99)
Potassium: 3.7 mmol/L (ref 3.5–5.1)
Sodium: 133 mmol/L — ABNORMAL LOW (ref 135–145)

## 2022-07-18 LAB — PHOSPHORUS: Phosphorus: 3.9 mg/dL (ref 2.5–4.6)

## 2022-07-18 LAB — C-REACTIVE PROTEIN: CRP: 2.2 mg/dL — ABNORMAL HIGH (ref <1.0)

## 2022-07-18 LAB — PROCALCITONIN: Procalcitonin: <0.1 ng/mL

## 2022-07-18 LAB — BRAIN NATRIURETIC PEPTIDE: B Natriuretic Peptide: 58 pg/mL (ref 0.0–100.0)

## 2022-07-18 MED ORDER — FUROSEMIDE 10 MG/ML IJ SOLN
40.0000 mg | Freq: Once | INTRAMUSCULAR | Status: AC
  Administered 2022-07-18: 40 mg via INTRAVENOUS
  Filled 2022-07-18: qty 4

## 2022-07-18 MED ORDER — AMLODIPINE BESYLATE 10 MG PO TABS
10.0000 mg | ORAL_TABLET | Freq: Every day | ORAL | Status: DC
  Administered 2022-07-18 – 2022-07-23 (×6): 10 mg via ORAL
  Filled 2022-07-18 (×6): qty 1

## 2022-07-18 NOTE — Progress Notes (Signed)
Physical Therapy Treatment Patient Details Name: Zachary Briggs MRN: [id number] DOB: [birth year] Today's Date: 07/18/2022   History of Present Illness Pt is 67 year old presented to [facility] on  07/10/22 for acute hypoxemic respiratory failure with bilateral pulmonary infiltrates most consistent with cocaine induced lung injury. PMH - chf, COPD, HTN, PTSD, substance abuse, aortic valve stenosis, ARDS    PT Comments    Pt tolerated today's session well, maintaining sats 89% or greater with mobility but remains on HHFNC at 30L 80% FiO2. Pt ambulating limited distance in room due to lines but tolerating well. Able to perform sit<>stand trials x10 reps for endurance training as well as incentive spirometer use. Educated pt on importance of continued mobility, sitting in chair, and incentive spirometer to help maintain and progress endurance. Pt remains motivated to work with therapy to progress back to his PLOF. Discharge recommendations remain appropriate as pt mobilizes well, acute PT will continue to follow to progress endurance and activity tolerance as able.     Recommendations for follow up therapy are one component of a multi-disciplinary discharge planning process, led by the attending physician.  Recommendations may be updated based on patient status, additional functional criteria and insurance authorization.  Follow Up Recommendations       Assistance Recommended at Discharge Intermittent Supervision/Assistance  Patient can return home with the following Help with stairs or ramp for entrance;A little help with walking and/or transfers   Equipment Recommendations  None recommended by PT    Recommendations for Other Services       Precautions / Restrictions Precautions Precautions: Other (comment) Precaution Comments: HHFNC Restrictions Weight Bearing Restrictions: No     Mobility  Bed Mobility               General bed mobility comments: pt seated in recliner upon  arrival, ended session with pt in recliner    Transfers Overall transfer level: Needs assistance Equipment used: None Transfers: Sit to/from Stand Sit to Stand: Supervision           General transfer comment: supervision for safety and line management    Ambulation/Gait Ambulation/Gait assistance: Supervision Gait Distance (Feet): 40 Feet Assistive device: None Gait Pattern/deviations: Step-through pattern, Decreased stride length, Drifts right/left Gait velocity: decreased     General Gait Details: supervision for safety and line management. Mild lateral sway with mild imbalance but no overt LOB   Stairs             Wheelchair Mobility    Modified Rankin (Stroke Patients Only)       Balance Overall balance assessment: Needs assistance Sitting-balance support: No upper extremity supported, Feet supported Sitting balance-Leahy Scale: Good     Standing balance support: No upper extremity supported, During functional activity Standing balance-Leahy Scale: Fair                              Cognition Arousal/Alertness: Awake/alert Behavior During Therapy: WFL for tasks assessed/performed Overall Cognitive Status: Within Functional Limits for tasks assessed                                          Exercises Other Exercises Other Exercises: sit<>stand x10 reps for endurance, pt resting ~3 seconds in seated position between each rep Other Exercises: Incentive spirometer use x10 reps. Target adjusted for pt and instructed  on prolonged inhale as able    General Comments General comments (skin integrity, edema, etc.): HHFNC 30L at 80% FiO2. Desatting to 89% with mobility, low 90s at rest but increasing to 96-97% with incentive spirometer use      Pertinent Vitals/Pain Pain Assessment Pain Assessment: No/denies pain    Home Living                          Prior Function            PT Goals (current goals can  now be found in the care plan section) Acute Rehab PT Goals Patient Stated Goal: return home PT Goal Formulation: With patient Time For Goal Achievement: 07/30/22 Potential to Achieve Goals: Good Progress towards PT goals: Progressing toward goals    Frequency    Min 3X/week      PT Plan Current plan remains appropriate    Co-evaluation              AM-PAC PT "6 Clicks" Mobility   Outcome Measure  Help needed turning from your back to your side while in a flat bed without using bedrails?: None Help needed moving from lying on your back to sitting on the side of a flat bed without using bedrails?: A Little Help needed moving to and from a bed to a chair (including a wheelchair)?: A Little Help needed standing up from a chair using your arms (e.g., wheelchair or bedside chair)?: A Little Help needed to walk in hospital room?: A Little Help needed climbing 3-5 steps with a railing? : A Little 6 Click Score: 19    End of Session Equipment Utilized During Treatment: Oxygen Activity Tolerance: Patient tolerated treatment well Patient left: in chair;with call bell/phone within reach Nurse Communication: Mobility status PT Visit Diagnosis: Other abnormalities of gait and mobility (R26.89);Muscle weakness (generalized) (M62.81)     Time: 1610-9604 PT Time Calculation (min) (ACUTE ONLY): 15 min  Charges:  $Therapeutic Activity: 8-22 mins                     Lindalou Hose, PT DPT Acute Rehabilitation Services Office [phone]    Zachary Briggs 07/18/2022, 5:18 PM

## 2022-07-18 NOTE — Care Management Important Message (Signed)
Important Message  Patient Details  Name: MAURIO COBERLEY MRN: [id number] Date of Birth: 07/12/1955   Medicare Important Message Given:  Yes     Eilah Common Stefan Church 07/18/2022, 3:31 PM

## 2022-07-18 NOTE — Progress Notes (Signed)
NAME:  Zachary Briggs, MRN:  [id number], DOB:  1955-08-14, LOS: 8 ADMISSION DATE:  07/10/2022, CONSULTATION DATE:  5/2 REFERRING MD:  Rancour, CHIEF COMPLAINT:  respiratory failure   History of Present Illness:  67 yo male smoker developed sudden onset of dyspnea and cough with clear sputum.  Hx of substance abuse and last smoked cocaine on 07/09/22.  SpO2 on room air in ER was 35%.  CXR showed b/l diffuse infiltrates and Rt base consolidation.  PCCM asked to admit to ICU.  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  Pertinent  Medical History  Aortic valve stenosis, BPH, cannabis dependence, prostate cancer, cocaine dependence, essential hypertension, GERD, headache, hepatitis C, back pain, mixed anxiety and depression disorder, heart murmur, nicotine dependence, nonsustained VT, SVT, history of pulmonary nodule, grade 1 diastolic dysfunction  Significant Hospital Events: Including procedures, antibiotic start and stop dates in addition to other pertinent events   5/2 admitted with acute respiratory failure secondary to mixed picture of acute decompensated heart failure and pneumonia versus perhaps drug-induced pneumonitis.  Started on noninvasive positive pressure ventilation, IV ceftriaxone, azithromycin, and hydrocortisone admitted to ICU  5/3 variable NPPV compliance. Weaning FiO2 on BiPAP -->  HHFNC 5/4 cont to wean FiO2  5/5 adv diet, diuresing  5/9 transfer to progressive care  Interim History / Subjective:  No acute distress.  Chest x-ray with increased edema  Objective   Blood pressure (!) 138/103, pulse 87, temperature 98 F (36.7 C), temperature source Oral, resp. rate (!) 22, height 5\' 5"  (1.651 m), weight 83.4 kg, SpO2 95 %.    FiO2 (%):  [70 %-80 %] 80 %   Intake/Output Summary (Last 24 hours) at 07/18/2022 1129 Last data filed at 07/17/2022 2246 Gross per 24 hour  Intake 250 ml  Output 800 ml  Net -550 ml   Filed Weights   07/16/22 0500 07/17/22  0500 07/18/22 0419  Weight: 79.1 kg 80.1 kg 83.4 kg    Examination: Well-nourished well-developed male no acute distress No JVD or lymphadenopathy is appreciated Currently high flow nasal cannula was 80% with sats of 93% no increased work of breathing at rest Decreased breath sounds throughout Heart sounds are regular regular rate rhythm Soft nontender Extremity with 1+ edema   Resolved Hospital Problem list   Hypokalemia, Hypermagnesemia, Elevated LFTs in setting of hypoxia  Assessment & Plan:   Acute hypoxic respiratory failure with pulmonary infiltrates.  Most likely from cocaine induced injury with acute pulmonary edema and questionable pneumonia.  Currently on 80% FiO2 via high flow nasal cannula with sats of 93% Continue to monitor Intermittent chest x-ray Continue steroids with prednisone Antibiotics have been completed Continue to monitor Continue diuresis   Tobacco abuse. Continue DuoNeb  Acute on chronic HFpEF. Aortic stenosis. Hx of HTN, HLD. Per primary  Anxiety, Chronic back pain, Cocaine abuse. Per primary  Hx of GERD. Per primary  Steroid induced hyperglycemia. Per primary   Best Practice (right click and "Reselect all SmartList Selections" daily)  Diet/type: Carb Diet. 1500 fluid restriction  DVT prophylaxis: prophylactic heparin  GI prophylaxis: PPI Code Status:  full code Last date of multidisciplinary goals of care discussion [full]  Labs       Latest Ref Rng & Units 07/18/2022    4:38 AM 07/17/2022   12:59 AM 07/16/2022    1:23 AM  CMP  Glucose 70 - 99 mg/dL 132  440  102   BUN 8 - 23 mg/dL 32  44  36   Creatinine 0.61 - 1.24 mg/dL 4.09  8.11  9.14   Sodium 135 - 145 mmol/L 133  134  136   Potassium 3.5 - 5.1 mmol/L 3.7  3.8  3.7   Chloride 98 - 111 mmol/L 100  99  94   CO2 22 - 32 mmol/L 21  24  27    Calcium 8.9 - 10.3 mg/dL 9.3  9.2  9.9        Latest Ref Rng & Units 07/18/2022    4:38 AM 07/17/2022   12:59 AM 07/16/2022    1:23  AM  CBC  WBC 4.0 - 10.5 K/uL 19.7  19.8  19.6   Hemoglobin 13.0 - 17.0 g/dL 78.2  95.6  21.3   Hematocrit 39.0 - 52.0 % 43.9  41.3  41.9   Platelets 150 - 400 K/uL 379  372  332    ABG    Component Value Date/Time   PHART 7.404 07/10/2022 2007   PCO2ART 31.2 (L) 07/10/2022 2007   PO2ART 97 07/10/2022 2007   HCO3 19.5 (L) 07/10/2022 2007   TCO2 20 (L) 07/10/2022 2007   ACIDBASEDEF 4.0 (H) 07/10/2022 2007   O2SAT 98 07/10/2022 2007    CBG (last 3)  Recent Labs    07/17/22 1555 07/17/22 2133 07/18/22 0836  GLUCAP 235* 129* 209*   Signature:  Brett Canales Hashir Deleeuw ACNP Acute Care Nurse Practitioner [facility]/Critical [facility] Please consult Amion 07/18/2022, 11:29 AM

## 2022-07-18 NOTE — Progress Notes (Signed)
PROGRESS NOTE                                                                                                                                                                                                             Patient Demographics:    Zachary Briggs, is a 67 y.o. male, DOB - 04/17/1955, ZOX:[id number]  Outpatient Primary MD for the [facility] is [facility]    LOS - 8  Admit date - 07/10/2022    Chief Complaint  Patient presents with   Shortness of Breath       Brief Narrative (HPI from H&P)    67 yo male smoker with past medical history of Aortic valve stenosis, BPH, cannabis dependence, prostate cancer, cocaine dependence, essential hypertension, GERD, headache, hepatitis C, back pain, mixed anxiety and depression disorder, heart murmur, nicotine dependence, nonsustained VT, SVT, history of pulmonary nodule, grade 1 diastolic dysfunction  developed sudden onset of dyspnea and cough with clear sputum. Hx of substance abuse and last smoked cocaine on 07/09/22. SpO2 on room air in ER was 35%. CXR showed b/l diffuse infiltrates and Rt base consolidation. 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 He was admitted for acute hypoxic respiratory failure due to pulmonary edema and possible pneumonia, required BiPAP/high heated high flow for several days transferred on heated high flow to my service on 07/18/2022 on day 8 of hospital stay.   Significant Hospital Events:   5/2 admitted with acute respiratory failure secondary to mixed picture of acute decompensated heart failure and pneumonia versus perhaps drug-induced pneumonitis.  Started on noninvasive positive pressure ventilation, IV ceftriaxone, azithromycin, and hydrocortisone admitted to ICU  5/3 variable NPPV compliance. Weaning FiO2 on BiPAP -->  HHFNC 5/4 cont to wean FiO2  5/5 adv diet, diuresing  5/10 transfer to my service on  07/18/2022   Subjective:    Zachary Briggs today has, No headache, No chest pain, No abdominal pain - No Nausea, No new weakness tingling or numbness, improved SOB.   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 CAP - echocardiogram noted with EF preserved at 60%.  - He has been treated in the ICU for a week initially required BiPAP now on heated  high flow with as needed BiPAP, still has pulmonary edema on exam, will initiate IV Lasix, he has finished his IV antibiotics, continue steroid taper, continue supplemental oxygen.  Counseled to quit cocaine.  Monitor blood pressure.  Adjust as needed.   Tobacco abuse.  Counseled to quit smoking, continue supportive care with nebulizer treatment steroid taper and oxygen.   Hypertension in poor control.  Placed on Coreg in ICU, added Norvasc for better control.   HLD.  On statin continue.   Anxiety, Chronic back pain, Cocaine abuse.  - continue zoloft, hold outpt lyrica   Hx of GERD.  - continue protonix   Steroid induced hyperglycemia. - SSI   CBG (last 3)  Recent Labs    07/17/22 1555 07/17/22 2133 07/18/22 0836  GLUCAP 235* 129* 209*         Condition - Extremely Guarded  Family Communication  :  None present  Code Status :  Full  Consults  :  PCCM  PUD Prophylaxis : PPI   Procedures  :     CT -   1. No evidence for pulmonary embolism. 2. Diffuse multifocal ground-glass opacities with smooth interlobular septal thickening throughout both lungs. Findings are nonspecific and can be seen in the setting of pulmonary edema, atypical infection, hypersensitivity pneumonitis, and other etiologies. 3. Patchy airspace disease in the bilateral lower lobes may represent atelectasis or infection. 4. Stable mild cardiomegaly. 5. Stable mediastinal lymphadenopathy. 6. Stable indeterminate right adrenal favored as benign given stability.  This can be further characterized with adrenal CT or MRI.  TTE -  1. Normal LV function; calcified aortic valve not well interrogated (mean gradient 3 mmHg but previously 27 mmHg 07/17/21; suggest FU limited study  with focus on aortic valve/AS when pt more stable).   2. Left ventricular ejection fraction, by estimation, is 60 to 65%. The left ventricle has normal function. The left ventricle has no regional wall motion abnormalities. There is moderate concentric left ventricular hypertrophy. Left ventricular diastolic parameters are consistent with Grade I diastolic dysfunction (impaired relaxation). Elevated left atrial pressure.   3. Right ventricular systolic function is normal. The right ventricular size is normal.   4. The mitral valve is normal in structure. No evidence of mitral valve regurgitation. No evidence of mitral stenosis.   5. The aortic valve is calcified. Aortic valve regurgitation is not visualized. No aortic stenosis is present.   6. The inferior vena cava is normal in size with greater than 50% respiratory variability, suggesting right atrial pressure of 3 mmHg.   RUQ [place] - Fatty liver infiltration. Gallbladder filled with stones. No ductal dilatation. Mild gallbladder wall thickening. Further workup as clinically directed such as HIDA scan       Disposition Plan  :    Status is: Inpatient  DVT Prophylaxis  :    heparin injection 5,000 Units Start: 07/10/22 1500 SCDs Start: 07/10/22 1451    Lab Results  Component Value Date   PLT 379 07/18/2022    Diet :  Diet Order             Diet Carb Modified Fluid consistency: Thin; Room service appropriate? Yes; Fluid restriction: 1500 mL Fluid  Diet effective now                    Inpatient Medications  Scheduled Meds:  atorvastatin  40 mg Oral Daily   carvedilol  6.25 mg Oral BID WC   Chlorhexidine Gluconate Cloth  6  each Topical Daily   furosemide  40 mg Intravenous Once   guaiFENesin  600 mg Oral BID    heparin  5,000 Units Subcutaneous Q8H   insulin aspart  0-15 Units Subcutaneous TID AC & HS   mouth rinse  15 mL Mouth Rinse 4 times per day   pantoprazole  40 mg Oral Q1200   predniSONE  20 mg Oral Q breakfast   sertraline  150 mg Oral Daily   Continuous Infusions: PRN Meds:.acetaminophen, hydrALAZINE, HYDROcodone-acetaminophen, ipratropium-albuterol, mouth rinse    Objective:   Vitals:   07/18/22 0018 07/18/22 0418 07/18/22 0419 07/18/22 0837  BP: (!) 142/92 (!) 144/78  (!) 138/103  Pulse: 87 87    Resp: (!) 21 (!) 22    Temp: 98.3 F (36.8 C) 98 F (36.7 C)    TempSrc: Oral Oral  Oral  SpO2: 95% 95%    Weight:   83.4 kg   Height:        Wt Readings from Last 3 Encounters:  07/18/22 83.4 kg  08/08/21 83.9 kg  07/30/21 83.4 kg     Intake/Output Summary (Last 24 hours) at 07/18/2022 1110 Last data filed at 07/17/2022 2246 Gross per 24 hour  Intake 250 ml  Output 800 ml  Net -550 ml     Physical Exam  Awake Alert, No new F.N deficits, Normal affect [place].AT,PERRAL Supple Neck, No JVD,   Symmetrical Chest wall movement, Good air movement bilaterally, ++ rales RRR,No Gallops,Rubs or new Murmurs,  +ve B.Sounds, Abd Soft, No tenderness,   No Cyanosis, Clubbing or edema       Data Review:    Recent Labs  Lab 07/11/22 1205 07/12/22 0044 07/14/22 0216 07/15/22 1019 07/16/22 0123 07/17/22 0059 07/18/22 0438  WBC 18.8*   < > 13.3* 14.3* 19.6* 19.8* 19.7*  HGB 13.4   < > 13.8 13.3 14.3 13.9 15.0  HCT 40.9   < > 42.3 38.8* 41.9 41.3 43.9  PLT 225   < > 243 292 332 372 379  MCV 79.6*   < > 78.0* 76.8* 76.5* 76.9* 77.4*  MCH 26.1   < > 25.5* 26.3 26.1 25.9* 26.5  MCHC 32.8   < > 32.6 34.3 34.1 33.7 34.2  RDW 16.9*   < > 16.9* 16.7* 16.6* 17.0* 17.4*  LYMPHSABS 1.1  --   --   --   --   --   --   MONOABS 0.8  --   --   --   --   --   --   EOSABS 0.0  --   --   --   --   --   --   BASOSABS 0.0  --   --   --   --   --   --    < > = values in this interval not  displayed.    Recent Labs  Lab 07/13/22 0015 07/14/22 0216 07/15/22 1019 07/15/22 1054 07/16/22 0123 07/17/22 0059 07/18/22 0438 07/18/22 0903  NA 139 140 140  --  136 134* 133*  --   K 3.7 3.8 4.1  --  3.7 3.8 3.7  --   CL 106 102 99  --  94* 99 100  --   CO2 23 23 24   --  27 24 21*  --   ANIONGAP 10 15 17*  --  15 11 12   --   GLUCOSE 146* 108* 184*  --  117* 124* 131*  --  BUN 22 20 26*  --  36* 44* 32*  --   CREATININE 1.02 1.13 1.24  --  1.41* 1.49* 1.06  --   AST  --  114*  --  54*  --   --   --   --   ALT  --  283*  --  165*  --   --   --   --   ALKPHOS  --  130*  --  101  --   --   --   --   BILITOT  --  0.9  --  1.2  --   --   --   --   ALBUMIN  --  3.0*  --  4.6  --   --   --   --   CRP  --   --   --   --   --   --   --  2.2*  BNP  --   --   --   --   --   --   --  58.0  MG 2.4  --  2.4  --  2.5* 2.6* 2.4  --   CALCIUM 8.3* 8.7* 10.1  --  9.9 9.2 9.3  --     Recent Labs  Lab 07/13/22 0015 07/14/22 0216 07/15/22 1019 07/16/22 0123 07/17/22 0059 07/18/22 0438 07/18/22 0903  CRP  --   --   --   --   --   --  2.2*  BNP  --   --   --   --   --   --  58.0  MG 2.4  --  2.4 2.5* 2.6* 2.4  --   CALCIUM 8.3* 8.7* 10.1 9.9 9.2 9.3  --    Lab Results  Component Value Date   HGBA1C 6.4 (H) 07/10/2022   Radiology Reports DG Chest Port 1 View  Result Date: 07/18/2022 CLINICAL DATA:  Shortness of breath EXAM: PORTABLE CHEST 1 VIEW COMPARISON:  07/16/2022 x-ray FINDINGS: Underinflated x-ray. Enlarged cardiopericardial silhouette with increasing vascular congestion and edema compared to previous. More confluence opacity also seen along both lung bases. Air bronchograms in the left lung base. No pneumothorax or effusion. Overlapping cardiac leads. Loop recorder along the left hemithorax IMPRESSION: Slight increase in interstitial changes and vascular congestion Electronically Signed   By: Karen Kays M.D.   On: 07/18/2022 10:49   DG Chest Port 1 View  Result Date:  07/16/2022 CLINICAL DATA:  Hypoxia EXAM: PORTABLE CHEST 1 VIEW COMPARISON:  Chest x-ray dated Jul 14, 2022 FINDINGS: Cardiac and mediastinal contours are unchanged. Loop recorder device. Similar diffuse interstitial opacities. New small left pleural effusion. No evidence of pneumothorax. IMPRESSION: 1. New small left pleural effusion. 2. Unchanged interstitial opacities, likely due to pulmonary edema. Electronically Signed   By: Allegra Lai M.D.   On: 07/16/2022 12:55   US Abdomen Limited RUQ (LIVER/GB)  Result Date: 07/14/2022 CLINICAL DATA:  Transaminitis EXAM: ULTRASOUND ABDOMEN LIMITED RIGHT UPPER QUADRANT COMPARISON:  None Available. FINDINGS: Gallbladder: Distended gallbladder with numerous shadowing stones. Some areas of wall echo shadow. No adjacent fluid. Mild wall thickening. Common bile duct: Diameter: 4 mm Liver: Diffusely echogenic hepatic parenchyma consistent with fatty liver infiltration. With this level of echogenicity evaluation for underlying mass lesion is limited and if needed follow-up contrast CT or MRI as clinically directed. Portal vein is patent on color Doppler imaging with normal direction of blood flow towards the liver. Other: None. IMPRESSION: Fatty liver  infiltration. Gallbladder filled with stones. No ductal dilatation. Mild gallbladder wall thickening. Further workup as clinically directed such as HIDA scan Electronically Signed   By: Karen Kays M.D.   On: 07/14/2022 15:38      Signature  -   Susa Raring M.D on 07/18/2022 at 11:10 AM   -  To page go to [email or web address]

## 2022-07-19 DIAGNOSIS — J9601 Acute respiratory failure with hypoxia: Secondary | ICD-10-CM | POA: Diagnosis not present

## 2022-07-19 LAB — GLUCOSE, CAPILLARY
Glucose-Capillary: 220 mg/dL — ABNORMAL HIGH (ref 70–99)
Glucose-Capillary: 172 mg/dL — ABNORMAL HIGH (ref 70–99)
Glucose-Capillary: 138 mg/dL — ABNORMAL HIGH (ref 70–99)
Glucose-Capillary: 161 mg/dL — ABNORMAL HIGH (ref 70–99)

## 2022-07-19 LAB — CBC WITH DIFFERENTIAL/PLATELET
Abs Immature Granulocytes: 0.36 10*3/uL — ABNORMAL HIGH (ref 0.00–0.07)
Basophils Absolute: 0.1 10*3/uL (ref 0.0–0.1)
Basophils Relative: 0 %
Eosinophils Absolute: 0.6 10*3/uL — ABNORMAL HIGH (ref 0.0–0.5)
Eosinophils Relative: 3 %
HCT: 44 % (ref 39.0–52.0)
Hemoglobin: 14.4 g/dL (ref 13.0–17.0)
Immature Granulocytes: 2 %
Lymphocytes Relative: 15 %
Lymphs Abs: 3.1 10*3/uL (ref 0.7–4.0)
MCH: 26.1 pg (ref 26.0–34.0)
MCHC: 32.7 g/dL (ref 30.0–36.0)
MCV: 79.7 fL — ABNORMAL LOW (ref 80.0–100.0)
Monocytes Absolute: 0.8 10*3/uL (ref 0.1–1.0)
Monocytes Relative: 4 %
Neutro Abs: 15.5 10*3/uL — ABNORMAL HIGH (ref 1.7–7.7)
Neutrophils Relative %: 76 %
Platelets: 427 10*3/uL — ABNORMAL HIGH (ref 150–400)
RBC: 5.52 MIL/uL (ref 4.22–5.81)
RDW: 17.2 % — ABNORMAL HIGH (ref 11.5–15.5)
WBC: 20.3 10*3/uL — ABNORMAL HIGH (ref 4.0–10.5)
nRBC: 0 % (ref 0.0–0.2)

## 2022-07-19 LAB — BASIC METABOLIC PANEL
Anion gap: 12 (ref 5–15)
BUN: 30 mg/dL — ABNORMAL HIGH (ref 8–23)
CO2: 23 mmol/L (ref 22–32)
Calcium: 9.6 mg/dL (ref 8.9–10.3)
Chloride: 99 mmol/L (ref 98–111)
Creatinine, Ser: 1.04 mg/dL (ref 0.61–1.24)
GFR, Estimated: >60 mL/min (ref >60)
Glucose, Bld: 119 mg/dL — ABNORMAL HIGH (ref 70–99)
Potassium: 3.6 mmol/L (ref 3.5–5.1)
Sodium: 134 mmol/L — ABNORMAL LOW (ref 135–145)

## 2022-07-19 LAB — C-REACTIVE PROTEIN: CRP: 1.5 mg/dL — ABNORMAL HIGH (ref <1.0)

## 2022-07-19 LAB — MAGNESIUM: Magnesium: 2.3 mg/dL (ref 1.7–2.4)

## 2022-07-19 LAB — PROCALCITONIN: Procalcitonin: <0.1 ng/mL

## 2022-07-19 LAB — BRAIN NATRIURETIC PEPTIDE: B Natriuretic Peptide: 51.9 pg/mL (ref 0.0–100.0)

## 2022-07-19 MED ORDER — FUROSEMIDE 10 MG/ML IJ SOLN
40.0000 mg | Freq: Once | INTRAMUSCULAR | Status: AC
  Administered 2022-07-19: 40 mg via INTRAVENOUS
  Filled 2022-07-19: qty 4

## 2022-07-19 MED ORDER — PREDNISONE 5 MG PO TABS
10.0000 mg | ORAL_TABLET | Freq: Every day | ORAL | Status: DC
  Administered 2022-07-19 – 2022-07-20 (×2): 10 mg via ORAL
  Filled 2022-07-19 (×2): qty 2

## 2022-07-19 MED ORDER — POTASSIUM CHLORIDE CRYS ER 20 MEQ PO TBCR
40.0000 meq | EXTENDED_RELEASE_TABLET | Freq: Once | ORAL | Status: AC
  Administered 2022-07-19: 40 meq via ORAL
  Filled 2022-07-19: qty 2

## 2022-07-19 MED ORDER — QUETIAPINE FUMARATE 25 MG PO TABS
25.0000 mg | ORAL_TABLET | Freq: Two times a day (BID) | ORAL | Status: DC
  Administered 2022-07-19 – 2022-07-23 (×9): 25 mg via ORAL
  Filled 2022-07-19 (×9): qty 1

## 2022-07-19 MED ORDER — POTASSIUM CHLORIDE CRYS ER 20 MEQ PO TBCR
20.0000 meq | EXTENDED_RELEASE_TABLET | Freq: Once | ORAL | Status: AC
  Administered 2022-07-19: 20 meq via ORAL
  Filled 2022-07-19: qty 1

## 2022-07-19 NOTE — Plan of Care (Signed)

## 2022-07-19 NOTE — Progress Notes (Signed)
PROGRESS NOTE                                                                                                                                                                                                             Patient Demographics:    Zachary Briggs, is a 67 y.o. male, DOB - 03/09/1956, ZOX:[id number]  Outpatient Primary MD for the patient is [facility]    LOS - 9  Admit date - 07/10/2022    Chief Complaint  Patient presents with   Shortness of Breath       Brief Narrative (HPI from H&P)    67 yo male smoker with past medical history of Aortic valve stenosis, BPH, cannabis dependence, prostate cancer, cocaine dependence, essential hypertension, GERD, headache, hepatitis C, back pain, mixed anxiety and depression disorder, heart murmur, nicotine dependence, nonsustained VT, SVT, history of pulmonary nodule, grade 1 diastolic dysfunction  developed sudden onset of dyspnea and cough with clear sputum. Hx of substance abuse and last smoked cocaine on 07/09/22. SpO2 on room air in ER was 35%. CXR showed b/l diffuse infiltrates and Rt base consolidation. 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 He was admitted for acute hypoxic respiratory failure due to pulmonary edema and possible pneumonia, required BiPAP/high heated high flow for several days transferred on heated high flow to my service on 07/18/2022 on day 8 of hospital stay.   Significant Hospital Events:   5/2 admitted with acute respiratory failure secondary to mixed picture of acute decompensated heart failure and pneumonia versus perhaps drug-induced pneumonitis.  Started on noninvasive positive pressure ventilation, IV ceftriaxone, azithromycin, and hydrocortisone admitted to ICU  5/3 variable NPPV compliance. Weaning FiO2 on BiPAP -->  HHFNC 5/4 cont to wean FiO2  5/5 adv diet, diuresing  5/10 transfer to my service on  07/18/2022   Subjective:   Patient in bed, appears comfortable, denies any headache, no fever, no chest pain or pressure, no shortness of breath , no abdominal pain. No new focal weakness.   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 CAP - echocardiogram noted with EF preserved at 60%.  - He has been treated in the ICU for a week initially required BiPAP  now on heated high flow with as needed BiPAP, still has pulmonary edema on exam, will initiate IV Lasix, he has finished his IV antibiotics, continue steroid taper, continue supplemental oxygen.  Counseled to quit cocaine.  Monitor blood pressure.  Adjust as needed.  SpO2: 96 % O2 Flow Rate (L/min): 30 L/min FiO2 (%): 80 %  Tobacco abuse.  Counseled to quit smoking, continue supportive care with nebulizer treatment steroid taper and oxygen.   Hypertension in poor control.  Placed on Coreg in ICU, added Norvasc for better control.   HLD.  On statin continue.   Anxiety, Chronic back pain, Cocaine abuse.  - continue zoloft, hold outpt lyrica   Hx of GERD.  - continue protonix   Steroid induced hyperglycemia. - SSI   CBG (last 3)  Recent Labs    07/18/22 1542 07/18/22 2120 07/19/22 0755  GLUCAP 201* 143* 220*         Condition - Extremely Guarded  Family Communication  : Daughter-in-law in the rm 07/19/22  Code Status :  Full  Consults  :  PCCM  PUD Prophylaxis : PPI   Procedures  :     CT -   1. No evidence for pulmonary embolism. 2. Diffuse multifocal ground-glass opacities with smooth interlobular septal thickening throughout both lungs. Findings are nonspecific and can be seen in the setting of pulmonary edema, atypical infection, hypersensitivity pneumonitis, and other etiologies. 3. Patchy airspace disease in the bilateral lower lobes may represent atelectasis or infection. 4. Stable mild cardiomegaly. 5. Stable  mediastinal lymphadenopathy. 6. Stable indeterminate right adrenal favored as benign given stability. This can be further characterized with adrenal CT or MRI.  TTE -  1. Normal LV function; calcified aortic valve not well interrogated (mean gradient 3 mmHg but previously 27 mmHg 07/17/21; suggest FU limited study  with focus on aortic valve/AS when pt more stable).   2. Left ventricular ejection fraction, by estimation, is 60 to 65%. The left ventricle has normal function. The left ventricle has no regional wall motion abnormalities. There is moderate concentric left ventricular hypertrophy. Left ventricular diastolic parameters are consistent with Grade I diastolic dysfunction (impaired relaxation). Elevated left atrial pressure.   3. Right ventricular systolic function is normal. The right ventricular size is normal.   4. The mitral valve is normal in structure. No evidence of mitral valve regurgitation. No evidence of mitral stenosis.   5. The aortic valve is calcified. Aortic valve regurgitation is not visualized. No aortic stenosis is present.   6. The inferior vena cava is normal in size with greater than 50% respiratory variability, suggesting right atrial pressure of 3 mmHg.   RUQ [place] - Fatty liver infiltration. Gallbladder filled with stones. No ductal dilatation. Mild gallbladder wall thickening. Further workup as clinically directed such as HIDA scan       Disposition Plan  :    Status is: Inpatient  DVT Prophylaxis  :    heparin injection 5,000 Units Start: 07/10/22 1500 SCDs Start: 07/10/22 1451    Lab Results  Component Value Date   PLT 427 (H) 07/19/2022    Diet :  Diet Order             Diet Carb Modified Fluid consistency: Thin; Room service appropriate? Yes; Fluid restriction: 1500 mL Fluid  Diet effective now                    Inpatient Medications  Scheduled Meds:  amLODipine  10  mg Oral Daily   atorvastatin  40 mg Oral Daily   carvedilol  6.25 mg  Oral BID WC   Chlorhexidine Gluconate Cloth  6 each Topical Daily   furosemide  40 mg Intravenous Once   guaiFENesin  600 mg Oral BID   heparin  5,000 Units Subcutaneous Q8H   insulin aspart  0-15 Units Subcutaneous TID AC & HS   mouth rinse  15 mL Mouth Rinse 4 times per day   pantoprazole  40 mg Oral Q1200   potassium chloride  20 mEq Oral Once   predniSONE  10 mg Oral Q breakfast   sertraline  150 mg Oral Daily   Continuous Infusions: PRN Meds:.acetaminophen, hydrALAZINE, HYDROcodone-acetaminophen, ipratropium-albuterol, mouth rinse    Objective:   Vitals:   07/19/22 0300 07/19/22 0427 07/19/22 0749 07/19/22 0756  BP:  (!) 143/70    Pulse:  78    Resp:  14    Temp:  98.6 F (37 C)    TempSrc:  Oral  Oral  SpO2: 92% 96%    Weight:   81.9 kg   Height:        Wt Readings from Last 3 Encounters:  07/19/22 81.9 kg  08/08/21 83.9 kg  07/30/21 83.4 kg     Intake/Output Summary (Last 24 hours) at 07/19/2022 0809 Last data filed at 07/19/2022 0750 Gross per 24 hour  Intake 240 ml  Output 1560 ml  Net -1320 ml     Physical Exam  Awake Alert, No new F.N deficits, Normal affect [place].AT,PERRAL Supple Neck, No JVD,   Symmetrical Chest wall movement, Good air movement bilaterally, ++ rales RRR,No Gallops,Rubs or new Murmurs,  +ve B.Sounds, Abd Soft, No tenderness,   No Cyanosis, Clubbing or edema       Data Review:    Recent Labs  Lab 07/15/22 1019 07/16/22 0123 07/17/22 0059 07/18/22 0438 07/19/22 0231  WBC 14.3* 19.6* 19.8* 19.7* 20.3*  HGB 13.3 14.3 13.9 15.0 14.4  HCT 38.8* 41.9 41.3 43.9 44.0  PLT 292 332 372 379 427*  MCV 76.8* 76.5* 76.9* 77.4* 79.7*  MCH 26.3 26.1 25.9* 26.5 26.1  MCHC 34.3 34.1 33.7 34.2 32.7  RDW 16.7* 16.6* 17.0* 17.4* 17.2*  LYMPHSABS  --   --   --   --  3.1  MONOABS  --   --   --   --  0.8  EOSABS  --   --   --   --  0.6*  BASOSABS  --   --   --   --  0.1    Recent Labs  Lab 07/14/22 0216 07/15/22 1019 07/15/22 1054  07/16/22 0123 07/17/22 0059 07/18/22 0438 07/18/22 0903 07/19/22 0231  NA 140 140  --  136 134* 133*  --  134*  K 3.8 4.1  --  3.7 3.8 3.7  --  3.6  CL 102 99  --  94* 99 100  --  99  CO2 23 24  --  27 24 21*  --  23  ANIONGAP 15 17*  --  15 11 12   --  12  GLUCOSE 108* 184*  --  117* 124* 131*  --  119*  BUN 20 26*  --  36* 44* 32*  --  30*  CREATININE 1.13 1.24  --  1.41* 1.49* 1.06  --  1.04  AST 114*  --  54*  --   --   --   --   --  ALT 283*  --  165*  --   --   --   --   --   ALKPHOS 130*  --  101  --   --   --   --   --   BILITOT 0.9  --  1.2  --   --   --   --   --   ALBUMIN 3.0*  --  4.6  --   --   --   --   --   CRP  --   --   --   --   --   --  2.2* 1.5*  PROCALCITON  --   --   --   --   --   --  <0.10 <0.10  BNP  --   --   --   --   --   --  58.0 51.9  MG  --  2.4  --  2.5* 2.6* 2.4  --  2.3  CALCIUM 8.7* 10.1  --  9.9 9.2 9.3  --  9.6    Recent Labs  Lab 07/15/22 1019 07/16/22 0123 07/17/22 0059 07/18/22 0438 07/18/22 0903 07/19/22 0231  CRP  --   --   --   --  2.2* 1.5*  PROCALCITON  --   --   --   --  <0.10 <0.10  BNP  --   --   --   --  58.0 51.9  MG 2.4 2.5* 2.6* 2.4  --  2.3  CALCIUM 10.1 9.9 9.2 9.3  --  9.6   Lab Results  Component Value Date   HGBA1C 6.4 (H) 07/10/2022   Radiology Reports DG Chest Port 1 View  Result Date: 07/18/2022 CLINICAL DATA:  Shortness of breath EXAM: PORTABLE CHEST 1 VIEW COMPARISON:  07/16/2022 x-ray FINDINGS: Underinflated x-ray. Enlarged cardiopericardial silhouette with increasing vascular congestion and edema compared to previous. More confluence opacity also seen along both lung bases. Air bronchograms in the left lung base. No pneumothorax or effusion. Overlapping cardiac leads. Loop recorder along the left hemithorax IMPRESSION: Slight increase in interstitial changes and vascular congestion Electronically Signed   By: Karen Kays M.D.   On: 07/18/2022 10:49   DG Chest Port 1 View  Result Date: 07/16/2022 CLINICAL  DATA:  Hypoxia EXAM: PORTABLE CHEST 1 VIEW COMPARISON:  Chest x-ray dated Jul 14, 2022 FINDINGS: Cardiac and mediastinal contours are unchanged. Loop recorder device. Similar diffuse interstitial opacities. New small left pleural effusion. No evidence of pneumothorax. IMPRESSION: 1. New small left pleural effusion. 2. Unchanged interstitial opacities, likely due to pulmonary edema. Electronically Signed   By: Allegra Lai M.D.   On: 07/16/2022 12:55      Signature  -   Susa Raring M.D on 07/19/2022 at 8:09 AM   -  To page go to [email or web address]

## 2022-07-20 ENCOUNTER — Inpatient Hospital Stay (HOSPITAL_COMMUNITY): Payer: Medicare HMO

## 2022-07-20 DIAGNOSIS — J9601 Acute respiratory failure with hypoxia: Secondary | ICD-10-CM | POA: Diagnosis not present

## 2022-07-20 LAB — CBC WITH DIFFERENTIAL/PLATELET
Abs Immature Granulocytes: 0.34 10*3/uL — ABNORMAL HIGH (ref 0.00–0.07)
Basophils Absolute: 0.1 10*3/uL (ref 0.0–0.1)
Basophils Relative: 1 %
Eosinophils Absolute: 0.6 10*3/uL — ABNORMAL HIGH (ref 0.0–0.5)
Eosinophils Relative: 3 %
HCT: 45.6 % (ref 39.0–52.0)
Hemoglobin: 15.1 g/dL (ref 13.0–17.0)
Immature Granulocytes: 2 %
Lymphocytes Relative: 15 %
Lymphs Abs: 3.2 10*3/uL (ref 0.7–4.0)
MCH: 26.1 pg (ref 26.0–34.0)
MCHC: 33.1 g/dL (ref 30.0–36.0)
MCV: 78.9 fL — ABNORMAL LOW (ref 80.0–100.0)
Monocytes Absolute: 1 10*3/uL (ref 0.1–1.0)
Monocytes Relative: 5 %
Neutro Abs: 15.9 10*3/uL — ABNORMAL HIGH (ref 1.7–7.7)
Neutrophils Relative %: 74 %
Platelets: 442 10*3/uL — ABNORMAL HIGH (ref 150–400)
RBC: 5.78 MIL/uL (ref 4.22–5.81)
RDW: 18 % — ABNORMAL HIGH (ref 11.5–15.5)
WBC: 21.1 10*3/uL — ABNORMAL HIGH (ref 4.0–10.5)
nRBC: 0 % (ref 0.0–0.2)

## 2022-07-20 LAB — GLUCOSE, CAPILLARY
Glucose-Capillary: 238 mg/dL — ABNORMAL HIGH (ref 70–99)
Glucose-Capillary: 124 mg/dL — ABNORMAL HIGH (ref 70–99)
Glucose-Capillary: 166 mg/dL — ABNORMAL HIGH (ref 70–99)
Glucose-Capillary: 217 mg/dL — ABNORMAL HIGH (ref 70–99)

## 2022-07-20 LAB — BASIC METABOLIC PANEL
Anion gap: 15 (ref 5–15)
BUN: 35 mg/dL — ABNORMAL HIGH (ref 8–23)
CO2: 22 mmol/L (ref 22–32)
Calcium: 9.9 mg/dL (ref 8.9–10.3)
Chloride: 97 mmol/L — ABNORMAL LOW (ref 98–111)
Creatinine, Ser: 1.19 mg/dL (ref 0.61–1.24)
GFR, Estimated: >60 mL/min (ref >60)
Glucose, Bld: 143 mg/dL — ABNORMAL HIGH (ref 70–99)
Potassium: 3.6 mmol/L (ref 3.5–5.1)
Sodium: 134 mmol/L — ABNORMAL LOW (ref 135–145)

## 2022-07-20 LAB — BRAIN NATRIURETIC PEPTIDE: B Natriuretic Peptide: 51.6 pg/mL (ref 0.0–100.0)

## 2022-07-20 LAB — C-REACTIVE PROTEIN: CRP: 1 mg/dL — ABNORMAL HIGH (ref <1.0)

## 2022-07-20 LAB — MAGNESIUM: Magnesium: 2.2 mg/dL (ref 1.7–2.4)

## 2022-07-20 LAB — PROCALCITONIN: Procalcitonin: <0.1 ng/mL

## 2022-07-20 MED ORDER — POTASSIUM CHLORIDE CRYS ER 20 MEQ PO TBCR
40.0000 meq | EXTENDED_RELEASE_TABLET | Freq: Once | ORAL | Status: AC
  Administered 2022-07-20: 40 meq via ORAL
  Filled 2022-07-20: qty 2

## 2022-07-20 MED ORDER — FUROSEMIDE 10 MG/ML IJ SOLN
60.0000 mg | Freq: Once | INTRAMUSCULAR | Status: AC
  Administered 2022-07-20: 60 mg via INTRAVENOUS
  Filled 2022-07-20: qty 6

## 2022-07-20 NOTE — Plan of Care (Signed)

## 2022-07-20 NOTE — Progress Notes (Signed)
PROGRESS NOTE                                                                                                                                                                                                             Patient Demographics:    Zachary Briggs, is a 67 y.o. male, DOB - 1956-02-08, UJW:[id number]  Outpatient [facility] the [facility]    LOS - 10  Admit date - 07/10/2022    Chief Complaint  Patient presents with   Shortness of Breath       Brief Narrative (HPI from H&P)    67 yo male smoker with past medical history of Aortic valve stenosis, BPH, cannabis dependence, prostate cancer, cocaine dependence, essential hypertension, GERD, headache, hepatitis C, back pain, mixed anxiety and depression disorder, heart murmur, nicotine dependence, nonsustained VT, SVT, history of pulmonary nodule, grade 1 diastolic dysfunction  developed sudden onset of dyspnea and cough with clear sputum. Hx of substance abuse and last smoked cocaine on 07/09/22. SpO2 on room air in ER was 35%. CXR showed b/l diffuse infiltrates and Rt base consolidation. 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 He was admitted for acute hypoxic respiratory failure due to pulmonary edema and possible pneumonia, required BiPAP/high heated high flow for several days transferred on heated high flow to my service on 07/18/2022 on day 8 of hospital stay.   Significant Hospital Events:   5/2 admitted with acute respiratory failure secondary to mixed picture of acute decompensated heart failure and pneumonia versus perhaps drug-induced pneumonitis.  Started on noninvasive positive pressure ventilation, IV ceftriaxone, azithromycin, and hydrocortisone admitted to ICU  5/3 variable NPPV compliance. Weaning FiO2 on BiPAP -->  HHFNC 5/4 cont to wean FiO2  5/5 adv diet, diuresing  5/10 transfer to my service on  07/18/2022   Subjective:   Patient in bed, appears comfortable, denies any headache, no fever, no chest pain or pressure, improving shortness of breath , no abdominal pain. No new focal weakness.   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 CAP - echocardiogram noted with EF preserved at 60%.  - He has been treated in the ICU for a week initially required BiPAP  now on heated high flow with as needed BiPAP, still has pulmonary edema on exam, will initiated IV Lasix, excellent response continue, he has finished his IV antibiotics, continue steroid taper, continue supplemental oxygen.  Counseled to quit cocaine.  Monitor blood pressure.  Adjust as needed.  SpO2: 90 % O2 Flow Rate (L/min): 7 L/min FiO2 (%): 80 %  Tobacco abuse.  Counseled to quit smoking, continue supportive care with nebulizer treatment steroid taper and oxygen.   Hypertension in poor control.  Placed on Coreg in ICU, added Norvasc for better control.   HLD.  On statin continue.   Anxiety, Chronic back pain, Cocaine abuse.  - continue zoloft, hold outpt lyrica   Hx of GERD.  - continue protonix   Metabolic encephalopathy.  Stable with Seroquel low-dose.  Continue to monitor.    Steroid induced hyperglycemia. - SSI   CBG (last 3)  Recent Labs    07/19/22 1538 07/19/22 2002 07/20/22 0807  GLUCAP 138* 161* 238*         Condition - Extremely Guarded  Family Communication  : Daughter-in-law in the rm 07/19/22  Code Status :  Full  Consults  :  PCCM  PUD Prophylaxis : PPI   Procedures  :     CT -   1. No evidence for pulmonary embolism. 2. Diffuse multifocal ground-glass opacities with smooth interlobular septal thickening throughout both lungs. Findings are nonspecific and can be seen in the setting of pulmonary edema, atypical infection, hypersensitivity pneumonitis, and other etiologies. 3. Patchy  airspace disease in the bilateral lower lobes may represent atelectasis or infection. 4. Stable mild cardiomegaly. 5. Stable mediastinal lymphadenopathy. 6. Stable indeterminate right adrenal favored as benign given stability. This can be further characterized with adrenal CT or MRI.  TTE -  1. Normal LV function; calcified aortic valve not well interrogated (mean gradient 3 mmHg but previously 27 mmHg 07/17/21; suggest FU limited study  with focus on aortic valve/AS when pt more stable).   2. Left ventricular ejection fraction, by estimation, is 60 to 65%. The left ventricle has normal function. The left ventricle has no regional wall motion abnormalities. There is moderate concentric left ventricular hypertrophy. Left ventricular diastolic parameters are consistent with Grade I diastolic dysfunction (impaired relaxation). Elevated left atrial pressure.   3. Right ventricular systolic function is normal. The right ventricular size is normal.   4. The mitral valve is normal in structure. No evidence of mitral valve regurgitation. No evidence of mitral stenosis.   5. The aortic valve is calcified. Aortic valve regurgitation is not visualized. No aortic stenosis is present.   6. The inferior vena cava is normal in size with greater than 50% respiratory variability, suggesting right atrial pressure of 3 mmHg.   RUQ [place] - Fatty liver infiltration. Gallbladder filled with stones. No ductal dilatation. Mild gallbladder wall thickening. Further workup as clinically directed such as HIDA scan       Disposition Plan  :    Status is: Inpatient  DVT Prophylaxis  :    heparin injection 5,000 Units Start: 07/10/22 1500 SCDs Start: 07/10/22 1451    Lab Results  Component Value Date   PLT 442 (H) 07/20/2022    Diet :  Diet Order             Diet Carb Modified Fluid consistency: Thin; Room service appropriate? Yes; Fluid restriction: 1500 mL Fluid  Diet effective now  Inpatient Medications  Scheduled Meds:  amLODipine  10 mg Oral Daily   atorvastatin  40 mg Oral Daily   carvedilol  6.25 mg Oral BID WC   Chlorhexidine Gluconate Cloth  6 each Topical Daily   guaiFENesin  600 mg Oral BID   heparin  5,000 Units Subcutaneous Q8H   insulin aspart  0-15 Units Subcutaneous TID AC & HS   mouth rinse  15 mL Mouth Rinse 4 times per day   pantoprazole  40 mg Oral Q1200   predniSONE  10 mg Oral Q breakfast   QUEtiapine  25 mg Oral BID   sertraline  150 mg Oral Daily   Continuous Infusions: PRN Meds:.acetaminophen, hydrALAZINE, HYDROcodone-acetaminophen, ipratropium-albuterol, mouth rinse    Objective:   Vitals:   07/20/22 0200 07/20/22 0400 07/20/22 0808 07/20/22 0902  BP:  138/85  (!) 155/92  Pulse: 74 85    Resp: 14 19    Temp:   (!) 97.5 F (36.4 C)   TempSrc:   Oral   SpO2: 93% 90%    Weight:      Height:        Wt Readings from Last 3 Encounters:  07/19/22 81.9 kg  08/08/21 83.9 kg  07/30/21 83.4 kg     Intake/Output Summary (Last 24 hours) at 07/20/2022 0959 Last data filed at 07/20/2022 1610 Gross per 24 hour  Intake 600 ml  Output 1800 ml  Net -1200 ml     Physical Exam  Awake Alert, No new F.N deficits, Normal affect [place].AT,PERRAL Supple Neck, No JVD,   Symmetrical Chest wall movement, Good air movement bilaterally, + rales RRR,No Gallops,Rubs or new Murmurs,  +ve B.Sounds, Abd Soft, No tenderness,   No Cyanosis, Clubbing or edema       Data Review:    Recent Labs  Lab 07/16/22 0123 07/17/22 0059 07/18/22 0438 07/19/22 0231 07/20/22 0318  WBC 19.6* 19.8* 19.7* 20.3* 21.1*  HGB 14.3 13.9 15.0 14.4 15.1  HCT 41.9 41.3 43.9 44.0 45.6  PLT 332 372 379 427* 442*  MCV 76.5* 76.9* 77.4* 79.7* 78.9*  MCH 26.1 25.9* 26.5 26.1 26.1  MCHC 34.1 33.7 34.2 32.7 33.1  RDW 16.6* 17.0* 17.4* 17.2* 18.0*  LYMPHSABS  --   --   --  3.1 3.2  MONOABS  --   --   --  0.8 1.0  EOSABS  --   --   --  0.6* 0.6*  BASOSABS  --    --   --  0.1 0.1    Recent Labs  Lab 07/14/22 0216 07/15/22 1019 07/15/22 1054 07/16/22 0123 07/17/22 0059 07/18/22 0438 07/18/22 0903 07/19/22 0231 07/20/22 0318  NA 140   < >  --  136 134* 133*  --  134* 134*  K 3.8   < >  --  3.7 3.8 3.7  --  3.6 3.6  CL 102   < >  --  94* 99 100  --  99 97*  CO2 23   < >  --  27 24 21*  --  23 22  ANIONGAP 15   < >  --  15 11 12   --  12 15  GLUCOSE 108*   < >  --  117* 124* 131*  --  119* 143*  BUN 20   < >  --  36* 44* 32*  --  30* 35*  CREATININE 1.13   < >  --  1.41* 1.49* 1.06  --  1.04 1.19  AST 114*  --  54*  --   --   --   --   --   --   ALT 283*  --  165*  --   --   --   --   --   --   ALKPHOS 130*  --  101  --   --   --   --   --   --   BILITOT 0.9  --  1.2  --   --   --   --   --   --   ALBUMIN 3.0*  --  4.6  --   --   --   --   --   --   CRP  --   --   --   --   --   --  2.2* 1.5* 1.0*  PROCALCITON  --   --   --   --   --   --  <0.10 <0.10 <0.10  BNP  --   --   --   --   --   --  58.0 51.9 51.6  MG  --    < >  --  2.5* 2.6* 2.4  --  2.3 2.2  CALCIUM 8.7*   < >  --  9.9 9.2 9.3  --  9.6 9.9   < > = values in this interval not displayed.    Recent Labs  Lab 07/16/22 0123 07/17/22 0059 07/18/22 0438 07/18/22 0903 07/19/22 0231 07/20/22 0318  CRP  --   --   --  2.2* 1.5* 1.0*  PROCALCITON  --   --   --  <0.10 <0.10 <0.10  BNP  --   --   --  58.0 51.9 51.6  MG 2.5* 2.6* 2.4  --  2.3 2.2  CALCIUM 9.9 9.2 9.3  --  9.6 9.9   Lab Results  Component Value Date   HGBA1C 6.4 (H) 07/10/2022   Radiology Reports DG Chest Port 1 View  Result Date: 07/20/2022 CLINICAL DATA:  Pulmonary infiltrates EXAM: PORTABLE CHEST 1 VIEW COMPARISON:  07/18/2022 FINDINGS: Heart borderline in size. Mediastinal contours within normal limits. Perihilar and lower lobe opacities slightly improved, likely improving edema. No effusions or acute bony abnormality. IMPRESSION: Improving pulmonary edema pattern. Electronically Signed   By: Charlett Nose  M.D.   On: 07/20/2022 07:16   DG Chest Port 1 View  Result Date: 07/18/2022 CLINICAL DATA:  Shortness of breath EXAM: PORTABLE CHEST 1 VIEW COMPARISON:  07/16/2022 x-ray FINDINGS: Underinflated x-ray. Enlarged cardiopericardial silhouette with increasing vascular congestion and edema compared to previous. More confluence opacity also seen along both lung bases. Air bronchograms in the left lung base. No pneumothorax or effusion. Overlapping cardiac leads. Loop recorder along the left hemithorax IMPRESSION: Slight increase in interstitial changes and vascular congestion Electronically Signed   By: Karen Kays M.D.   On: 07/18/2022 10:49   DG Chest Port 1 View  Result Date: 07/16/2022 CLINICAL DATA:  Hypoxia EXAM: PORTABLE CHEST 1 VIEW COMPARISON:  Chest x-ray dated Jul 14, 2022 FINDINGS: Cardiac and mediastinal contours are unchanged. Loop recorder device. Similar diffuse interstitial opacities. New small left pleural effusion. No evidence of pneumothorax. IMPRESSION: 1. New small left pleural effusion. 2. Unchanged interstitial opacities, likely due to pulmonary edema. Electronically Signed   By: Allegra Lai M.D.   On: 07/16/2022 12:55      Signature  -   Susa Raring M.D on  07/20/2022 at 9:59 AM   -  To page go to [email or web address]

## 2022-07-20 NOTE — Progress Notes (Signed)
Mobility Specialist Progress Note    07/20/22 1206  Mobility  Activity Ambulated with assistance in hallway  Level of Assistance Standby assist, set-up cues, supervision of patient - no hands on  Assistive Device Four wheel walker  Distance Ambulated (ft) 420 ft  Activity Response Tolerated well  Mobility Referral Yes  $Mobility charge 1 Mobility  Mobility Specialist Start Time (ACUTE ONLY) 1155  Mobility Specialist Stop Time (ACUTE ONLY) 1205  Mobility Specialist Time Calculation (min) (ACUTE ONLY) 10 min   Pre-Mobility: 95 HR, 90% SpO2 During Mobility: 102 HR Post-Mobility: 103 HR, 89% SpO2  Pt received in chair and agreeable. No complaints. SpO2 88-90% on 7LO2 in room. During walk, SpO2 low to mid 80s on 8LO2. Increased to 10LO2 and maintained SpO2 >/=89%. Encouraged pursed lip breathing throughout. Pt able to walk and talk without rest. Returned to chair with call bell in reach. On 7LO2. RN notified.    [place] Mobility Specialist  Please Neurosurgeon or Rehab Office at [phone]

## 2022-07-21 DIAGNOSIS — J9601 Acute respiratory failure with hypoxia: Secondary | ICD-10-CM | POA: Diagnosis not present

## 2022-07-21 LAB — CBC WITH DIFFERENTIAL/PLATELET
Abs Immature Granulocytes: 0.34 10*3/uL — ABNORMAL HIGH (ref 0.00–0.07)
Basophils Absolute: 0.1 10*3/uL (ref 0.0–0.1)
Basophils Relative: 1 %
Eosinophils Absolute: 0.5 10*3/uL (ref 0.0–0.5)
Eosinophils Relative: 2 %
HCT: 47.2 % (ref 39.0–52.0)
Hemoglobin: 15.2 g/dL (ref 13.0–17.0)
Immature Granulocytes: 2 %
Lymphocytes Relative: 15 %
Lymphs Abs: 3.4 10*3/uL (ref 0.7–4.0)
MCH: 25.7 pg — ABNORMAL LOW (ref 26.0–34.0)
MCHC: 32.2 g/dL (ref 30.0–36.0)
MCV: 79.7 fL — ABNORMAL LOW (ref 80.0–100.0)
Monocytes Absolute: 1.1 10*3/uL — ABNORMAL HIGH (ref 0.1–1.0)
Monocytes Relative: 5 %
Neutro Abs: 16.4 10*3/uL — ABNORMAL HIGH (ref 1.7–7.7)
Neutrophils Relative %: 75 %
Platelets: 449 10*3/uL — ABNORMAL HIGH (ref 150–400)
RBC: 5.92 MIL/uL — ABNORMAL HIGH (ref 4.22–5.81)
RDW: 17.5 % — ABNORMAL HIGH (ref 11.5–15.5)
WBC: 21.9 10*3/uL — ABNORMAL HIGH (ref 4.0–10.5)
nRBC: 0 % (ref 0.0–0.2)

## 2022-07-21 LAB — BASIC METABOLIC PANEL
Anion gap: 14 (ref 5–15)
BUN: 32 mg/dL — ABNORMAL HIGH (ref 8–23)
CO2: 22 mmol/L (ref 22–32)
Calcium: 9.7 mg/dL (ref 8.9–10.3)
Chloride: 99 mmol/L (ref 98–111)
Creatinine, Ser: 1.08 mg/dL (ref 0.61–1.24)
GFR, Estimated: >60 mL/min (ref >60)
Glucose, Bld: 105 mg/dL — ABNORMAL HIGH (ref 70–99)
Potassium: 4 mmol/L (ref 3.5–5.1)
Sodium: 135 mmol/L (ref 135–145)

## 2022-07-21 LAB — GLUCOSE, CAPILLARY
Glucose-Capillary: 169 mg/dL — ABNORMAL HIGH (ref 70–99)
Glucose-Capillary: 226 mg/dL — ABNORMAL HIGH (ref 70–99)
Glucose-Capillary: 168 mg/dL — ABNORMAL HIGH (ref 70–99)
Glucose-Capillary: 240 mg/dL — ABNORMAL HIGH (ref 70–99)

## 2022-07-21 LAB — C-REACTIVE PROTEIN: CRP: 0.8 mg/dL (ref <1.0)

## 2022-07-21 LAB — BRAIN NATRIURETIC PEPTIDE: B Natriuretic Peptide: 36 pg/mL (ref 0.0–100.0)

## 2022-07-21 LAB — MAGNESIUM: Magnesium: 2.3 mg/dL (ref 1.7–2.4)

## 2022-07-21 LAB — PROCALCITONIN: Procalcitonin: <0.1 ng/mL

## 2022-07-21 MED ORDER — POTASSIUM CHLORIDE CRYS ER 20 MEQ PO TBCR
20.0000 meq | EXTENDED_RELEASE_TABLET | Freq: Two times a day (BID) | ORAL | Status: AC
  Administered 2022-07-21 (×2): 20 meq via ORAL
  Filled 2022-07-21 (×2): qty 1

## 2022-07-21 MED ORDER — TRAMADOL HCL 50 MG PO TABS
50.0000 mg | ORAL_TABLET | Freq: Four times a day (QID) | ORAL | Status: DC | PRN
  Administered 2022-07-22: 50 mg via ORAL
  Filled 2022-07-21: qty 1

## 2022-07-21 MED ORDER — PREDNISONE 5 MG PO TABS
5.0000 mg | ORAL_TABLET | Freq: Every day | ORAL | Status: DC
  Administered 2022-07-21 – 2022-07-23 (×3): 5 mg via ORAL
  Filled 2022-07-21 (×3): qty 1

## 2022-07-21 MED ORDER — FUROSEMIDE 10 MG/ML IJ SOLN
60.0000 mg | Freq: Two times a day (BID) | INTRAMUSCULAR | Status: AC
  Administered 2022-07-21 (×2): 60 mg via INTRAVENOUS
  Filled 2022-07-21 (×2): qty 6

## 2022-07-21 NOTE — Plan of Care (Signed)

## 2022-07-21 NOTE — Progress Notes (Signed)
   07/21/22 0816  Therapy Vitals  Pulse Rate 92  Resp 20  MEWS Score/Color  MEWS Score 0  MEWS Score Color Green  Oxygen Therapy/Pulse Ox  O2 Device (S)  HFNC  O2 Therapy Oxygen humidified  O2 Flow Rate (L/min) 7 L/min  SpO2 93 %

## 2022-07-21 NOTE — Progress Notes (Signed)
Physical Therapy Treatment Patient Details Name: Zachary Briggs MRN: [id number] DOB: 1955/07/13 Today's Date: 07/21/2022   History of Present Illness Pt is 67 year old presented to [facility] on  07/10/22 for acute hypoxemic respiratory failure with bilateral pulmonary infiltrates most consistent with cocaine induced lung injury. PMH - chf, COPD, HTN, PTSD, substance abuse, aortic valve stenosis, ARDS    PT Comments    Pt greeted up in chair and agreeable to session with continued progress towards acute goals, however pt continues to be limited by decreased cardiopulmonary endurance. Pt able to progress gait in hall with RW support and supervision assist for safety with SpO2 grossly 89-96% on HFNC 6L, dropping to 84% x2 for with pt able to recover >90% within ~10 seconds with cues for breathing techniques. Reinforced education re: incentive spirometer use with pt able to demonstrate x10 with cues for technique, frequent mobility and time up in chair with pt verbalizing understanding. Pt continues to benefit from skilled PT services to progress toward functional mobility goals.    Recommendations for follow up therapy are one component of a multi-disciplinary discharge planning process, led by the attending physician.  Recommendations may be updated based on patient status, additional functional criteria and insurance authorization.  Follow Up Recommendations       Assistance Recommended at Discharge Intermittent Supervision/Assistance  Patient can return home with the following Help with stairs or ramp for entrance;A little help with walking and/or transfers   Equipment Recommendations  None recommended by PT    Recommendations for Other Services       Precautions / Restrictions Precautions Precautions: Other (comment) Precaution Comments: HHFNC Restrictions Weight Bearing Restrictions: No     Mobility  Bed Mobility Overal bed mobility: Needs Assistance             General bed  mobility comments: pt seated in recliner upon arrival, ended session with pt in recliner    Transfers Overall transfer level: Needs assistance Equipment used: None Transfers: Sit to/from Stand Sit to Stand: Supervision           General transfer comment: supervision for safety and line management, coming to stand and able to maintain without UE support before taking RW    Ambulation/Gait Ambulation/Gait assistance: Supervision Gait Distance (Feet): 400 Feet Assistive device: Rolling walker (2 wheels) Gait Pattern/deviations: Step-through pattern, Decreased stride length, Drifts right/left Gait velocity: decreased     General Gait Details: supervision for safety and line management.   Stairs             Wheelchair Mobility    Modified Rankin (Stroke Patients Only)       Balance Overall balance assessment: Needs assistance Sitting-balance support: No upper extremity supported, Feet supported Sitting balance-Leahy Scale: Good     Standing balance support: No upper extremity supported, During functional activity Standing balance-Leahy Scale: Fair                              Cognition Arousal/Alertness: Awake/alert Behavior During Therapy: WFL for tasks assessed/performed Overall Cognitive Status: Within Functional Limits for tasks assessed                                          Exercises Other Exercises Other Exercises: Incentive spirometer use x10 reps. able to achieve , cues for extended inhale  General Comments General comments (skin integrity, edema, etc.): SpO2 98% on 7L HFNC on arrival, ambualting on 6L with SpO2 varying throughotu 89-96%, x2 instances dropping to 84% but recovering to >90% in <10 seconds with cues for breathing techniques,      Pertinent Vitals/Pain Pain Assessment Pain Assessment: No/denies pain    Home Living                          Prior Function            PT Goals  (current goals can now be found in the care plan section) Acute Rehab PT Goals Patient Stated Goal: return home PT Goal Formulation: With patient Time For Goal Achievement: 07/30/22 Progress towards PT goals: Progressing toward goals    Frequency    Min 3X/week      PT Plan Current plan remains appropriate    Co-evaluation              AM-PAC PT "6 Clicks" Mobility   Outcome Measure  Help needed turning from your back to your side while in a flat bed without using bedrails?: None Help needed moving from lying on your back to sitting on the side of a flat bed without using bedrails?: A Little Help needed moving to and from a bed to a chair (including a wheelchair)?: A Little Help needed standing up from a chair using your arms (e.g., wheelchair or bedside chair)?: A Little Help needed to walk in hospital room?: A Little Help needed climbing 3-5 steps with a railing? : A Little 6 Click Score: 19    End of Session Equipment Utilized During Treatment: Oxygen Activity Tolerance: Patient tolerated treatment well Patient left: in chair;with call bell/phone within reach Nurse Communication: Mobility status PT Visit Diagnosis: Other abnormalities of gait and mobility (R26.89);Muscle weakness (generalized) (M62.81)     Time: 1610-9604 PT Time Calculation (min) (ACUTE ONLY): 22 min  Charges:  $Gait Training: 8-22 mins                     Zachary Fetty R. PTA Acute Rehabilitation Services Office: [phone]    Zachary Briggs 07/21/2022, 10:17 AM

## 2022-07-21 NOTE — Progress Notes (Signed)
PROGRESS NOTE                                                                                                                                                                                                             Patient Demographics:    Zachary Briggs, is a 67 y.o. male, DOB - 07/06/1955, ZOX:[id number]  Outpatient Primary MD for the patient is [facility]    LOS - 11  Admit date - 07/10/2022    Chief Complaint  Patient presents with   Shortness of Breath       Brief Narrative (HPI from H&P)    67 yo male smoker with past medical history of Aortic valve stenosis, BPH, cannabis dependence, prostate cancer, cocaine dependence, essential hypertension, GERD, headache, hepatitis C, back pain, mixed anxiety and depression disorder, heart murmur, nicotine dependence, nonsustained VT, SVT, history of pulmonary nodule, grade 1 diastolic dysfunction  developed sudden onset of dyspnea and cough with clear sputum. Hx of substance abuse and last smoked cocaine on 07/09/22. SpO2 on room air in ER was 35%. CXR showed b/l diffuse infiltrates and Rt base consolidation. 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 He was admitted for acute hypoxic respiratory failure due to pulmonary edema and possible pneumonia, required BiPAP/high heated high flow for several days transferred on heated high flow to my service on 07/18/2022 on day 8 of hospital stay.   Significant Hospital Events:   5/2 admitted with acute respiratory failure secondary to mixed picture of acute decompensated heart failure and pneumonia versus perhaps drug-induced pneumonitis.  Started on noninvasive positive pressure ventilation, IV ceftriaxone, azithromycin, and hydrocortisone admitted to ICU  5/3 variable NPPV compliance. Weaning FiO2 on BiPAP -->  HHFNC 5/4 cont to wean FiO2  5/5 adv diet, diuresing  5/10 transfer to my service on  07/18/2022   Subjective:   Patient in bed, appears comfortable, denies any headache, no fever, no chest pain or pressure, improving orthopnea and shortness of breath , no abdominal pain. No focal weakness.   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 CAP - echocardiogram noted with EF preserved at 60%    - He has been treated in the ICU for a week  initially required BiPAP now on heated high flow with as needed BiPAP, still has pulmonary edema on exam, will initiated IV Lasix, excellent response continue, he has finished his IV antibiotics, continue steroid taper, continue supplemental oxygen.  Counseled to quit cocaine.  Monitor blood pressure.  Adjust as needed.  SpO2: 93 % O2 Flow Rate (L/min): 7 L/min FiO2 (%): 80 %  Tobacco abuse.  Counseled to quit smoking, continue supportive care with nebulizer treatment steroid taper and oxygen.   Hypertension in poor control.  Placed on Coreg in ICU, added Norvasc for better control.   HLD.  On statin continue.   Anxiety, Chronic back pain, Cocaine abuse.  - continue zoloft, hold outpt lyrica   Hx of GERD.  - continue protonix   Metabolic encephalopathy.  Stable with Seroquel low-dose.  Continue to monitor.    Steroid induced leukocytosis.  Afebrile with stable procalcitonin and CRP, trend.    Steroid induced hyperglycemia. - SSI  CBG (last 3)   Recent Labs    07/20/22 1154 07/20/22 1641 07/20/22 2134  GLUCAP 124* 166* 217*       Condition - Extremely Guarded  Family Communication  : Daughter-in-law in the rm 07/19/22  Code Status :  Full  Consults  :  PCCM  PUD Prophylaxis : PPI   Procedures  :     CT -   1. No evidence for pulmonary embolism. 2. Diffuse multifocal ground-glass opacities with smooth interlobular septal thickening throughout both lungs. Findings are nonspecific and can be seen in the setting of pulmonary  edema, atypical infection, hypersensitivity pneumonitis, and other etiologies. 3. Patchy airspace disease in the bilateral lower lobes may represent atelectasis or infection. 4. Stable mild cardiomegaly. 5. Stable mediastinal lymphadenopathy. 6. Stable indeterminate right adrenal favored as benign given stability. This can be further characterized with adrenal CT or MRI.  TTE -  1. Normal LV function; calcified aortic valve not well interrogated (mean gradient 3 mmHg but previously 27 mmHg 07/17/21; suggest FU limited study  with focus on aortic valve/AS when pt more stable).   2. Left ventricular ejection fraction, by estimation, is 60 to 65%. The left ventricle has normal function. The left ventricle has no regional wall motion abnormalities. There is moderate concentric left ventricular hypertrophy. Left ventricular diastolic parameters are consistent with Grade I diastolic dysfunction (impaired relaxation). Elevated left atrial pressure.   3. Right ventricular systolic function is normal. The right ventricular size is normal.   4. The mitral valve is normal in structure. No evidence of mitral valve regurgitation. No evidence of mitral stenosis.   5. The aortic valve is calcified. Aortic valve regurgitation is not visualized. No aortic stenosis is present.   6. The inferior vena cava is normal in size with greater than 50% respiratory variability, suggesting right atrial pressure of 3 mmHg.   RUQ [place] - Fatty liver infiltration. Gallbladder filled with stones. No ductal dilatation. Mild gallbladder wall thickening. Further workup as clinically directed such as HIDA scan       Disposition Plan  :    Status is: Inpatient  DVT Prophylaxis  :    heparin injection 5,000 Units Start: 07/10/22 1500 SCDs Start: 07/10/22 1451    Lab Results  Component Value Date   PLT 449 (H) 07/21/2022    Diet :  Diet Order             Diet Carb Modified Fluid consistency: Thin; Room service  appropriate? Yes; Fluid restriction: 1500 mL  Fluid  Diet effective now                    Inpatient Medications  Scheduled Meds:  amLODipine  10 mg Oral Daily   atorvastatin  40 mg Oral Daily   carvedilol  6.25 mg Oral BID WC   Chlorhexidine Gluconate Cloth  6 each Topical Daily   furosemide  60 mg Intravenous BID   guaiFENesin  600 mg Oral BID   heparin  5,000 Units Subcutaneous Q8H   insulin aspart  0-15 Units Subcutaneous TID AC & HS   mouth rinse  15 mL Mouth Rinse 4 times per day   pantoprazole  40 mg Oral Q1200   potassium chloride  20 mEq Oral BID   predniSONE  5 mg Oral Q breakfast   QUEtiapine  25 mg Oral BID   sertraline  150 mg Oral Daily   Continuous Infusions: PRN Meds:.acetaminophen, hydrALAZINE, ipratropium-albuterol, mouth rinse, traMADol    Objective:   Vitals:   07/20/22 1516 07/20/22 2000 07/20/22 2240 07/21/22 0000  BP:  129/84  (!) 146/102  Pulse:  89  90  Resp: (!) 24 (!) 22  20  Temp:  97.7 F (36.5 C)  97.9 F (36.6 C)  TempSrc:  Oral  Oral  SpO2: 91% 94% 95% 93%  Weight:      Height:        Wt Readings from Last 3 Encounters:  07/19/22 81.9 kg  08/08/21 83.9 kg  07/30/21 83.4 kg     Intake/Output Summary (Last 24 hours) at 07/21/2022 0744 Last data filed at 07/21/2022 9811 Gross per 24 hour  Intake --  Output 1000 ml  Net -1000 ml     Physical Exam  Awake Alert, No new F.N deficits, Normal affect [place].AT,PERRAL Supple Neck, No JVD,   Symmetrical Chest wall movement, Good air movement bilaterally, + rales RRR,No Gallops,Rubs or new Murmurs,  +ve B.Sounds, Abd Soft, No tenderness,   No Cyanosis, Clubbing or edema       Data Review:    Recent Labs  Lab 07/17/22 0059 07/18/22 0438 07/19/22 0231 07/20/22 0318 07/21/22 0332  WBC 19.8* 19.7* 20.3* 21.1* 21.9*  HGB 13.9 15.0 14.4 15.1 15.2  HCT 41.3 43.9 44.0 45.6 47.2  PLT 372 379 427* 442* 449*  MCV 76.9* 77.4* 79.7* 78.9* 79.7*  MCH 25.9* 26.5 26.1 26.1 25.7*   MCHC 33.7 34.2 32.7 33.1 32.2  RDW 17.0* 17.4* 17.2* 18.0* 17.5*  LYMPHSABS  --   --  3.1 3.2 3.4  MONOABS  --   --  0.8 1.0 1.1*  EOSABS  --   --  0.6* 0.6* 0.5  BASOSABS  --   --  0.1 0.1 0.1    Recent Labs  Lab 07/15/22 1054 07/16/22 0123 07/17/22 0059 07/18/22 0438 07/18/22 0903 07/19/22 0231 07/20/22 0318 07/21/22 0332  NA  --    < > 134* 133*  --  134* 134* 135  K  --    < > 3.8 3.7  --  3.6 3.6 4.0  CL  --    < > 99 100  --  99 97* 99  CO2  --    < > 24 21*  --  23 22 22   ANIONGAP  --    < > 11 12  --  12 15 14   GLUCOSE  --    < > 124* 131*  --  119* 143* 105*  BUN  --    < >  44* 32*  --  30* 35* 32*  CREATININE  --    < > 1.49* 1.06  --  1.04 1.19 1.08  AST 54*  --   --   --   --   --   --   --   ALT 165*  --   --   --   --   --   --   --   ALKPHOS 101  --   --   --   --   --   --   --   BILITOT 1.2  --   --   --   --   --   --   --   ALBUMIN 4.6  --   --   --   --   --   --   --   CRP  --   --   --   --  2.2* 1.5* 1.0* 0.8  PROCALCITON  --   --   --   --  <0.10 <0.10 <0.10 <0.10  BNP  --   --   --   --  58.0 51.9 51.6 36.0  MG  --    < > 2.6* 2.4  --  2.3 2.2 2.3  CALCIUM  --    < > 9.2 9.3  --  9.6 9.9 9.7   < > = values in this interval not displayed.    Recent Labs  Lab 07/17/22 0059 07/18/22 0438 07/18/22 0903 07/19/22 0231 07/20/22 0318 07/21/22 0332  CRP  --   --  2.2* 1.5* 1.0* 0.8  PROCALCITON  --   --  <0.10 <0.10 <0.10 <0.10  BNP  --   --  58.0 51.9 51.6 36.0  MG 2.6* 2.4  --  2.3 2.2 2.3  CALCIUM 9.2 9.3  --  9.6 9.9 9.7   Lab Results  Component Value Date   HGBA1C 6.4 (H) 07/10/2022   Radiology Reports DG Chest Port 1 View  Result Date: 07/20/2022 CLINICAL DATA:  Pulmonary infiltrates EXAM: PORTABLE CHEST 1 VIEW COMPARISON:  07/18/2022 FINDINGS: Heart borderline in size. Mediastinal contours within normal limits. Perihilar and lower lobe opacities slightly improved, likely improving edema. No effusions or acute bony abnormality.  IMPRESSION: Improving pulmonary edema pattern. Electronically Signed   By: Charlett Nose M.D.   On: 07/20/2022 07:16   DG Chest Port 1 View  Result Date: 07/18/2022 CLINICAL DATA:  Shortness of breath EXAM: PORTABLE CHEST 1 VIEW COMPARISON:  07/16/2022 x-ray FINDINGS: Underinflated x-ray. Enlarged cardiopericardial silhouette with increasing vascular congestion and edema compared to previous. More confluence opacity also seen along both lung bases. Air bronchograms in the left lung base. No pneumothorax or effusion. Overlapping cardiac leads. Loop recorder along the left hemithorax IMPRESSION: Slight increase in interstitial changes and vascular congestion Electronically Signed   By: Karen Kays M.D.   On: 07/18/2022 10:49      Signature  -   Susa Raring M.D on 07/21/2022 at 7:44 AM   -  To page go to [email or web address]

## 2022-07-22 DIAGNOSIS — J9601 Acute respiratory failure with hypoxia: Secondary | ICD-10-CM | POA: Diagnosis not present

## 2022-07-22 LAB — PROCALCITONIN: Procalcitonin: <0.1 ng/mL

## 2022-07-22 LAB — GLUCOSE, CAPILLARY
Glucose-Capillary: 186 mg/dL — ABNORMAL HIGH (ref 70–99)
Glucose-Capillary: 158 mg/dL — ABNORMAL HIGH (ref 70–99)
Glucose-Capillary: 153 mg/dL — ABNORMAL HIGH (ref 70–99)
Glucose-Capillary: 157 mg/dL — ABNORMAL HIGH (ref 70–99)

## 2022-07-22 LAB — CBC WITH DIFFERENTIAL/PLATELET
Abs Immature Granulocytes: 0.28 10*3/uL — ABNORMAL HIGH (ref 0.00–0.07)
Basophils Absolute: 0.1 10*3/uL (ref 0.0–0.1)
Basophils Relative: 1 %
Eosinophils Absolute: 0.5 10*3/uL (ref 0.0–0.5)
Eosinophils Relative: 3 %
HCT: 48.9 % (ref 39.0–52.0)
Hemoglobin: 15.7 g/dL (ref 13.0–17.0)
Immature Granulocytes: 1 %
Lymphocytes Relative: 17 %
Lymphs Abs: 3.3 10*3/uL (ref 0.7–4.0)
MCH: 25.7 pg — ABNORMAL LOW (ref 26.0–34.0)
MCHC: 32.1 g/dL (ref 30.0–36.0)
MCV: 80 fL (ref 80.0–100.0)
Monocytes Absolute: 1.2 10*3/uL — ABNORMAL HIGH (ref 0.1–1.0)
Monocytes Relative: 6 %
Neutro Abs: 14.4 10*3/uL — ABNORMAL HIGH (ref 1.7–7.7)
Neutrophils Relative %: 72 %
Platelets: 392 10*3/uL (ref 150–400)
RBC: 6.11 MIL/uL — ABNORMAL HIGH (ref 4.22–5.81)
RDW: 18.1 % — ABNORMAL HIGH (ref 11.5–15.5)
WBC: 19.7 10*3/uL — ABNORMAL HIGH (ref 4.0–10.5)
nRBC: 0 % (ref 0.0–0.2)

## 2022-07-22 LAB — BASIC METABOLIC PANEL
Anion gap: 13 (ref 5–15)
BUN: 31 mg/dL — ABNORMAL HIGH (ref 8–23)
CO2: 20 mmol/L — ABNORMAL LOW (ref 22–32)
Calcium: 9.6 mg/dL (ref 8.9–10.3)
Chloride: 101 mmol/L (ref 98–111)
Creatinine, Ser: 1.28 mg/dL — ABNORMAL HIGH (ref 0.61–1.24)
GFR, Estimated: >60 mL/min (ref >60)
Glucose, Bld: 136 mg/dL — ABNORMAL HIGH (ref 70–99)
Potassium: 4.2 mmol/L (ref 3.5–5.1)
Sodium: 134 mmol/L — ABNORMAL LOW (ref 135–145)

## 2022-07-22 LAB — MAGNESIUM: Magnesium: 2.1 mg/dL (ref 1.7–2.4)

## 2022-07-22 LAB — C-REACTIVE PROTEIN: CRP: 0.5 mg/dL (ref <1.0)

## 2022-07-22 LAB — BRAIN NATRIURETIC PEPTIDE: B Natriuretic Peptide: 50.2 pg/mL (ref 0.0–100.0)

## 2022-07-22 MED ORDER — HALOPERIDOL LACTATE 5 MG/ML IJ SOLN: 2.0000 mg | Freq: Four times a day (QID) | INTRAMUSCULAR | Status: DC | PRN

## 2022-07-22 MED ORDER — FUROSEMIDE 40 MG PO TABS
40.0000 mg | ORAL_TABLET | Freq: Once | ORAL | Status: AC
  Administered 2022-07-22: 40 mg via ORAL
  Filled 2022-07-22: qty 1

## 2022-07-22 MED ORDER — HALOPERIDOL LACTATE 5 MG/ML IJ SOLN
INTRAMUSCULAR | Status: AC
  Filled 2022-07-22: qty 1

## 2022-07-22 MED ORDER — POTASSIUM CHLORIDE CRYS ER 20 MEQ PO TBCR: 20.0000 meq | EXTENDED_RELEASE_TABLET | Freq: Two times a day (BID) | ORAL | Status: DC

## 2022-07-22 MED ORDER — HALOPERIDOL LACTATE 5 MG/ML IJ SOLN
2.0000 mg | Freq: Four times a day (QID) | INTRAMUSCULAR | Status: DC | PRN
  Administered 2022-07-22: 2 mg via INTRAVENOUS

## 2022-07-22 NOTE — Progress Notes (Signed)
PROGRESS NOTE                                                                                                                                                                                                             Patient Demographics:    Zachary Briggs, is a 67 y.o. male, DOB - 04/11/1955, ZOX:[id number]  Outpatient Primary MD for the patient is [facility]    LOS - 12  Admit date - 07/10/2022    Chief Complaint  Patient presents with   Shortness of Breath       Brief Narrative (HPI from H&P)    67 yo male smoker with past medical history of Aortic valve stenosis, BPH, cannabis dependence, prostate cancer, cocaine dependence, essential hypertension, GERD, headache, hepatitis C, back pain, mixed anxiety and depression disorder, heart murmur, nicotine dependence, nonsustained VT, SVT, history of pulmonary nodule, grade 1 diastolic dysfunction  developed sudden onset of dyspnea and cough with clear sputum. Hx of substance abuse and last smoked cocaine on 07/09/22. SpO2 on room air in ER was 35%. CXR showed b/l diffuse infiltrates and Rt base consolidation. 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 He was admitted for acute hypoxic respiratory failure due to pulmonary edema and possible pneumonia, required BiPAP/high heated high flow for several days transferred on heated high flow to my service on 07/18/2022 on day 8 of hospital stay.   Significant Hospital Events:   5/2 admitted with acute respiratory failure secondary to mixed picture of acute decompensated heart failure and pneumonia versus perhaps drug-induced pneumonitis.  Started on noninvasive positive pressure ventilation, IV ceftriaxone, azithromycin, and hydrocortisone admitted to ICU  5/3 variable NPPV compliance. Weaning FiO2 on BiPAP -->  HHFNC 5/4 cont to wean FiO2  5/5 adv diet, diuresing  5/10 transfer to my service on  07/18/2022   Subjective:   Patient in bed, appears comfortable, denies any headache, no fever, no chest pain or pressure, much improved orthopnea and shortness of breath , no abdominal pain. No new focal weakness.    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 CAP - echocardiogram noted with EF preserved at 60%    - He has been treated in the ICU  for a week initially required BiPAP now on heated high flow with as needed BiPAP, still has pulmonary edema on exam, has been aggressively diuresed with IV Lasix with over 10 L negative fluid status, excellent improvement in oxygen requirement he is down to 5 L from 30 L heated high flow, now that creatinine has bumped will cut down on diuretics, he has finished his IV antibiotics, continue steroid taper, continue supplemental oxygen.  Counseled to quit cocaine.  Monitor on oral diuretics and try to titrate down oxygen further, he is not on home oxygen, if he is down to 3 to 4 L of oxygen on a consistent basis likely will discharge home on home oxygen.  Does not want to go to SNF.  SpO2: 90 % O2 Flow Rate (L/min): 5 L/min FiO2 (%): 80 %  Tobacco abuse.  Counseled to quit smoking, continue supportive care with nebulizer treatment steroid taper and oxygen.   Hypertension in poor control.  Placed on Coreg in ICU, added Norvasc for better control.   HLD.  On statin continue.   Anxiety, Chronic back pain, Cocaine abuse.  - continue zoloft, hold outpt lyrica   Hx of GERD.  - continue protonix   Metabolic encephalopathy.  Stable with Seroquel low-dose.  Continue to monitor.    Steroid induced leukocytosis.  Afebrile with stable procalcitonin and CRP, trend.    Steroid induced hyperglycemia. - SSI  CBG (last 3)   Recent Labs    07/21/22 1217 07/21/22 1557 07/21/22 2128  GLUCAP 226* 168* 240*       Condition - Extremely Guarded  Family  Communication  : Daughter-in-law in the rm 07/19/22  Code Status :  Full  Consults  :  PCCM  PUD Prophylaxis : PPI   Procedures  :     CT -   1. No evidence for pulmonary embolism. 2. Diffuse multifocal ground-glass opacities with smooth interlobular septal thickening throughout both lungs. Findings are nonspecific and can be seen in the setting of pulmonary edema, atypical infection, hypersensitivity pneumonitis, and other etiologies. 3. Patchy airspace disease in the bilateral lower lobes may represent atelectasis or infection. 4. Stable mild cardiomegaly. 5. Stable mediastinal lymphadenopathy. 6. Stable indeterminate right adrenal favored as benign given stability. This can be further characterized with adrenal CT or MRI.  TTE -  1. Normal LV function; calcified aortic valve not well interrogated (mean gradient 3 mmHg but previously 27 mmHg 07/17/21; suggest FU limited study  with focus on aortic valve/AS when pt more stable).   2. Left ventricular ejection fraction, by estimation, is 60 to 65%. The left ventricle has normal function. The left ventricle has no regional wall motion abnormalities. There is moderate concentric left ventricular hypertrophy. Left ventricular diastolic parameters are consistent with Grade I diastolic dysfunction (impaired relaxation). Elevated left atrial pressure.   3. Right ventricular systolic function is normal. The right ventricular size is normal.   4. The mitral valve is normal in structure. No evidence of mitral valve regurgitation. No evidence of mitral stenosis.   5. The aortic valve is calcified. Aortic valve regurgitation is not visualized. No aortic stenosis is present.   6. The inferior vena cava is normal in size with greater than 50% respiratory variability, suggesting right atrial pressure of 3 mmHg.   RUQ [place] - Fatty liver infiltration. Gallbladder filled with stones. No ductal dilatation. Mild gallbladder wall thickening. Further workup as  clinically directed such as HIDA scan       Disposition  Plan  :    Status is: Inpatient  DVT Prophylaxis  :    heparin injection 5,000 Units Start: 07/10/22 1500 SCDs Start: 07/10/22 1451    Lab Results  Component Value Date   PLT 392 07/22/2022    Diet :  Diet Order             Diet Carb Modified Fluid consistency: Thin; Room service appropriate? Yes; Fluid restriction: 1500 mL Fluid  Diet effective now                    Inpatient Medications  Scheduled Meds:  amLODipine  10 mg Oral Daily   atorvastatin  40 mg Oral Daily   carvedilol  6.25 mg Oral BID WC   Chlorhexidine Gluconate Cloth  6 each Topical Daily   furosemide  40 mg Oral Once   guaiFENesin  600 mg Oral BID   heparin  5,000 Units Subcutaneous Q8H   insulin aspart  0-15 Units Subcutaneous TID AC & HS   mouth rinse  15 mL Mouth Rinse 4 times per day   pantoprazole  40 mg Oral Q1200   predniSONE  5 mg Oral Q breakfast   QUEtiapine  25 mg Oral BID   sertraline  150 mg Oral Daily   Continuous Infusions: PRN Meds:.acetaminophen, hydrALAZINE, ipratropium-albuterol, mouth rinse, traMADol    Objective:   Vitals:   07/21/22 1200 07/21/22 2000 07/22/22 0000 07/22/22 0400  BP: 137/82 (!) 145/90 (!) 144/123 124/79  Pulse: 87 95 93 91  Resp: (!) 23 16 20 14   Temp:  97.7 F (36.5 C) 97.9 F (36.6 C) 98.1 F (36.7 C)  TempSrc:  Oral Oral Oral  SpO2: 91% 97% 91% 90%  Weight:      Height:        Wt Readings from Last 3 Encounters:  07/19/22 81.9 kg  08/08/21 83.9 kg  07/30/21 83.4 kg     Intake/Output Summary (Last 24 hours) at 07/22/2022 0816 Last data filed at 07/22/2022 0300 Gross per 24 hour  Intake --  Output 700 ml  Net -700 ml     Physical Exam  Awake Alert, No new F.N deficits, Normal affect [place].AT,PERRAL Supple Neck, No JVD,   Symmetrical Chest wall movement, Good air movement bilaterally, few rales RRR,No Gallops,Rubs or new Murmurs,  +ve B.Sounds, Abd Soft, No tenderness,    No Cyanosis, Clubbing or edema       Data Review:    Recent Labs  Lab 07/18/22 0438 07/19/22 0231 07/20/22 0318 07/21/22 0332 07/22/22 0423  WBC 19.7* 20.3* 21.1* 21.9* 19.7*  HGB 15.0 14.4 15.1 15.2 15.7  HCT 43.9 44.0 45.6 47.2 48.9  PLT 379 427* 442* 449* 392  MCV 77.4* 79.7* 78.9* 79.7* 80.0  MCH 26.5 26.1 26.1 25.7* 25.7*  MCHC 34.2 32.7 33.1 32.2 32.1  RDW 17.4* 17.2* 18.0* 17.5* 18.1*  LYMPHSABS  --  3.1 3.2 3.4 3.3  MONOABS  --  0.8 1.0 1.1* 1.2*  EOSABS  --  0.6* 0.6* 0.5 0.5  BASOSABS  --  0.1 0.1 0.1 0.1    Recent Labs  Lab 07/15/22 1054 07/16/22 0123 07/18/22 0438 07/18/22 0903 07/19/22 0231 07/20/22 0318 07/21/22 0332 07/22/22 0423  NA  --    < > 133*  --  134* 134* 135 134*  K  --    < > 3.7  --  3.6 3.6 4.0 4.2  CL  --    < > 100  --  99 97* 99 101  CO2  --    < > 21*  --  23 22 22  20*  ANIONGAP  --    < > 12  --  12 15 14 13   GLUCOSE  --    < > 131*  --  119* 143* 105* 136*  BUN  --    < > 32*  --  30* 35* 32* 31*  CREATININE  --    < > 1.06  --  1.04 1.19 1.08 1.28*  AST 54*  --   --   --   --   --   --   --   ALT 165*  --   --   --   --   --   --   --   ALKPHOS 101  --   --   --   --   --   --   --   BILITOT 1.2  --   --   --   --   --   --   --   ALBUMIN 4.6  --   --   --   --   --   --   --   CRP  --   --   --  2.2* 1.5* 1.0* 0.8 0.5  PROCALCITON  --   --   --  <0.10 <0.10 <0.10 <0.10 <0.10  BNP  --   --   --  58.0 51.9 51.6 36.0 50.2  MG  --    < > 2.4  --  2.3 2.2 2.3 2.1  CALCIUM  --    < > 9.3  --  9.6 9.9 9.7 9.6   < > = values in this interval not displayed.    Recent Labs  Lab 07/18/22 0438 07/18/22 0903 07/19/22 0231 07/20/22 0318 07/21/22 0332 07/22/22 0423  CRP  --  2.2* 1.5* 1.0* 0.8 0.5  PROCALCITON  --  <0.10 <0.10 <0.10 <0.10 <0.10  BNP  --  58.0 51.9 51.6 36.0 50.2  MG 2.4  --  2.3 2.2 2.3 2.1  CALCIUM 9.3  --  9.6 9.9 9.7 9.6   Lab Results  Component Value Date   HGBA1C 6.4 (H) 07/10/2022   Radiology  Reports DG Chest Port 1 View  Result Date: 07/20/2022 CLINICAL DATA:  Pulmonary infiltrates EXAM: PORTABLE CHEST 1 VIEW COMPARISON:  07/18/2022 FINDINGS: Heart borderline in size. Mediastinal contours within normal limits. Perihilar and lower lobe opacities slightly improved, likely improving edema. No effusions or acute bony abnormality. IMPRESSION: Improving pulmonary edema pattern. Electronically Signed   By: Charlett Nose M.D.   On: 07/20/2022 07:16      Signature  -   Susa Raring M.D on 07/22/2022 at 8:16 AM   -  To page go to [email or web address]

## 2022-07-22 NOTE — Plan of Care (Signed)

## 2022-07-22 NOTE — Progress Notes (Signed)
Patient up in hall and walked with walker with walker from room 35 through the circle  and back. 02 sat 88% when first standing up and then walked and 02 sat down to 76%. Patient is now back up to 95% at rest on 3l [place].

## 2022-07-23 ENCOUNTER — Other Ambulatory Visit (HOSPITAL_COMMUNITY): Payer: Self-pay

## 2022-07-23 DIAGNOSIS — F1490 Cocaine use, unspecified, uncomplicated: Secondary | ICD-10-CM | POA: Diagnosis not present

## 2022-07-23 DIAGNOSIS — J9601 Acute respiratory failure with hypoxia: Secondary | ICD-10-CM | POA: Diagnosis not present

## 2022-07-23 DIAGNOSIS — I5033 Acute on chronic diastolic (congestive) heart failure: Secondary | ICD-10-CM

## 2022-07-23 LAB — GLUCOSE, CAPILLARY
Glucose-Capillary: 232 mg/dL — ABNORMAL HIGH (ref 70–99)
Glucose-Capillary: 190 mg/dL — ABNORMAL HIGH (ref 70–99)

## 2022-07-23 LAB — BASIC METABOLIC PANEL
Anion gap: 12 (ref 5–15)
BUN: 29 mg/dL — ABNORMAL HIGH (ref 8–23)
CO2: 20 mmol/L — ABNORMAL LOW (ref 22–32)
Calcium: 9.3 mg/dL (ref 8.9–10.3)
Chloride: 100 mmol/L (ref 98–111)
Creatinine, Ser: 1.19 mg/dL (ref 0.61–1.24)
GFR, Estimated: >60 mL/min (ref >60)
Glucose, Bld: 195 mg/dL — ABNORMAL HIGH (ref 70–99)
Potassium: 3.8 mmol/L (ref 3.5–5.1)
Sodium: 132 mmol/L — ABNORMAL LOW (ref 135–145)

## 2022-07-23 LAB — CBC WITH DIFFERENTIAL/PLATELET
Abs Immature Granulocytes: 0.26 10*3/uL — ABNORMAL HIGH (ref 0.00–0.07)
Basophils Absolute: 0.1 10*3/uL (ref 0.0–0.1)
Basophils Relative: 1 %
Eosinophils Absolute: 0.4 10*3/uL (ref 0.0–0.5)
Eosinophils Relative: 2 %
HCT: 45.3 % (ref 39.0–52.0)
Hemoglobin: 14.9 g/dL (ref 13.0–17.0)
Immature Granulocytes: 1 %
Lymphocytes Relative: 18 %
Lymphs Abs: 3.3 10*3/uL (ref 0.7–4.0)
MCH: 26.1 pg (ref 26.0–34.0)
MCHC: 32.9 g/dL (ref 30.0–36.0)
MCV: 79.5 fL — ABNORMAL LOW (ref 80.0–100.0)
Monocytes Absolute: 1.1 10*3/uL — ABNORMAL HIGH (ref 0.1–1.0)
Monocytes Relative: 6 %
Neutro Abs: 13 10*3/uL — ABNORMAL HIGH (ref 1.7–7.7)
Neutrophils Relative %: 72 %
Platelets: 405 10*3/uL — ABNORMAL HIGH (ref 150–400)
RBC: 5.7 MIL/uL (ref 4.22–5.81)
RDW: 17.3 % — ABNORMAL HIGH (ref 11.5–15.5)
WBC: 18.1 10*3/uL — ABNORMAL HIGH (ref 4.0–10.5)
nRBC: 0 % (ref 0.0–0.2)

## 2022-07-23 LAB — PROCALCITONIN: Procalcitonin: <0.1 ng/mL

## 2022-07-23 MED ORDER — PANTOPRAZOLE SODIUM 40 MG PO TBEC
40.0000 mg | DELAYED_RELEASE_TABLET | Freq: Every day | ORAL | 0 refills | Status: AC
  Filled 2022-07-23: qty 30, 30d supply, fill #0

## 2022-07-23 MED ORDER — PREDNISONE 5 MG PO TABS
ORAL_TABLET | ORAL | 0 refills | Status: AC
  Filled 2022-07-23: qty 10, 6d supply, fill #0

## 2022-07-23 MED ORDER — FUROSEMIDE 20 MG PO TABS
ORAL_TABLET | ORAL | 0 refills | Status: AC
  Filled 2022-07-23: qty 30, 28d supply, fill #0

## 2022-07-23 NOTE — Progress Notes (Signed)
SATURATION QUALIFICATIONS: (This note is used to comply with regulatory documentation for home oxygen)  Patient Saturations on Room Air at Rest = *88% RA  Patient Saturations on Room Air while Ambulating = 76% RA  Patient Saturations on 3 Liters of oxygen while Ambulating = 95%  Please briefly explain why patient needs home oxygen: this patient can not maintain acceptable oxygen saturation level on room air at rest, oxygen is required

## 2022-07-23 NOTE — Discharge Instructions (Signed)
Follow with [facility], [facility] in 7 days   Get CBC, CMP, 2 view Chest X ray checked  by Primary MD next visit.    Activity: As tolerated with Full fall precautions use walker/cane & assistance as needed   Disposition Home    Diet: Heart Healthy  , with feeding assistance and aspiration precautions.  For Heart failure patients - Check your Weight same time everyday, if you gain over 2 pounds, or you develop in leg swelling, experience more shortness of breath or chest pain, call your Primary MD immediately. Follow Cardiac Low Salt Diet and 1.5 lit/day fluid restriction.   On your next visit with your primary care physician please Get Medicines reviewed and adjusted.   Please request your Prim.MD to go over all Hospital Tests and Procedure/Radiological results at the follow up, please get all Hospital records sent to your Prim MD by signing hospital release before you go home.   If you experience worsening of your admission symptoms, develop shortness of breath, life threatening emergency, suicidal or homicidal thoughts you must seek medical attention immediately by calling 911 or calling your MD immediately  if symptoms less severe.  You Must read complete instructions/literature along with all the possible adverse reactions/side effects for all the Medicines you take and that have been prescribed to you. Take any new Medicines after you have completely understood and accpet all the possible adverse reactions/side effects.   Do not drive, operating heavy machinery, perform activities at heights, swimming or participation in water activities or provide baby sitting services if your were admitted for syncope or siezures until you have seen by Primary MD or a Neurologist and advised to do so again.  Do not drive when taking Pain medications.    Do not take more than prescribed Pain, Sleep and Anxiety Medications  Special Instructions: If you have smoked or chewed  Tobacco  in the last 2 yrs please stop smoking, stop any regular Alcohol  and or any Recreational drug use.  Wear Seat belts while driving.   Please note  You were cared for by a hospitalist during your hospital stay. If you have any questions about your discharge medications or the care you received while you were in the hospital after you are discharged, you can call the unit and asked to speak with the hospitalist on call if the hospitalist that took care of you is not available. Once you are discharged, your primary care physician will handle any further medical issues. Please note that NO REFILLS for any discharge medications will be authorized once you are discharged, as it is imperative that you return to your primary care physician (or establish a relationship with a primary care physician if you do not have one) for your aftercare needs so that they can reassess your need for medications and monitor your lab values.Follow with [facility] in [facility] days   Get CBC, CMP, 2 view Chest X ray checked  by Primary MD next visit.    Activity: As tolerated with Full fall precautions use walker/cane & assistance as needed   Disposition Home **   Diet: Heart Healthy ** , with feeding assistance and aspiration precautions.  For Heart failure patients - Check your Weight same time everyday, if you gain over 2 pounds, or you develop in leg swelling, experience more shortness of breath or chest pain, call your Primary MD immediately. Follow Cardiac Low Salt Diet and 1.5 lit/day fluid  restriction.   On your next visit with your primary care physician please Get Medicines reviewed and adjusted.   Please request your Prim.MD to go over all Hospital Tests and Procedure/Radiological results at the follow up, please get all Hospital records sent to your Prim MD by signing hospital release before you go home.   If you experience worsening of your admission symptoms, develop  shortness of breath, life threatening emergency, suicidal or homicidal thoughts you must seek medical attention immediately by calling 911 or calling your MD immediately  if symptoms less severe.  You Must read complete instructions/literature along with all the possible adverse reactions/side effects for all the Medicines you take and that have been prescribed to you. Take any new Medicines after you have completely understood and accpet all the possible adverse reactions/side effects.   Do not drive, operating heavy machinery, perform activities at heights, swimming or participation in water activities or provide baby sitting services if your were admitted for syncope or siezures until you have seen by Primary MD or a Neurologist and advised to do so again.  Do not drive when taking Pain medications.    Do not take more than prescribed Pain, Sleep and Anxiety Medications  Special Instructions: If you have smoked or chewed Tobacco  in the last 2 yrs please stop smoking, stop any regular Alcohol  and or any Recreational drug use.  Wear Seat belts while driving.   Please note  You were cared for by a hospitalist during your hospital stay. If you have any questions about your discharge medications or the care you received while you were in the hospital after you are discharged, you can call the unit and asked to speak with the hospitalist on call if the hospitalist that took care of you is not available. Once you are discharged, your primary care physician will handle any further medical issues. Please note that NO REFILLS for any discharge medications will be authorized once you are discharged, as it is imperative that you return to your primary care physician (or establish a relationship with a primary care physician if you do not have one) for your aftercare needs so that they can reassess your need for medications and monitor your lab values.

## 2022-07-23 NOTE — Discharge Summary (Signed)
Physician Discharge Summary  Zachary Briggs [facility] ZOX:[id number] DOB: [birth year] DOA: 07/10/2022  PCP: [facility]  Admit date: 07/10/2022 Discharge date: 07/23/2022  Admitted From: (Home) Disposition:  (Home)  Recommendations for Outpatient Follow-up:  Follow up with PCP in 1-2 weeks Please obtain BMP/CBC in one week Please  continue counseling about cocaine abuse  Discharge Condition: (Stable) CODE STATUS: (FULL) Diet recommendation: Heart Healthy / Carb Modified   home oxygen 3L on discharge  Brief/Interim Summary:    67 yo male smoker with past medical history of Aortic valve stenosis, BPH, cannabis dependence, prostate cancer, cocaine dependence, essential hypertension, GERD, headache, hepatitis C, back pain, mixed anxiety and depression disorder, heart murmur, nicotine dependence, nonsustained VT, SVT, history of pulmonary nodule, grade 1 diastolic dysfunction  developed sudden onset of dyspnea and cough with clear sputum. Hx of substance abuse and last smoked cocaine on 07/09/22. SpO2 on room air in ER was 35%. CXR showed b/l diffuse infiltrates and Rt base consolidation. 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 He was admitted for acute hypoxic respiratory failure due to pulmonary edema and possible pneumonia, required BiPAP/high heated high flow was treated with IV diuresis, antibiotics and steroids, hypoxia much improved, used to be on 30 L heated high flow nasal cannula, he is on 3 L oxygen at time of discharge.  Acute hypoxic respiratory failure -Community acquired pneumonia -Acute on chronic diastolic CHF - He has been treated in the ICU for a week initially required BiPAP changed to heated high flow nasal cannula, referred to progressive care, he was treated with IV diuresis, steroids, Juliane finished antibiotics for up pneumonia, respiratory status much improved, he is currently on 3 L nasal cannula, home oxygen will be arranged at time of  discharge  - echocardiogram noted with EF preserved at 60%    Tobacco abuse.   Counseled to quit smoking,    Hypertension in poor control.   Educations on discharge  HLD.   - On statin continue.   Anxiety, Chronic back pain,   - continue zoloft,  lyrica  Cocaine abuse- he was counseled   Hx of GERD.  - continue protonix   Metabolic encephalopathy.  Resolved, mentation back to baseline   Steroid induced leukocytosis.  Afebrile with stable procalcitonin and CRP, trend.     Steroid induced hyperglycemia. - SSI during hospital stay.   Discharge Diagnoses:  Principal Problem:   Acute respiratory failure (HCC) Active Problems:   Crack cocaine use   Acute on chronic diastolic heart failure (HCC)   Hypokalemia   Lung problems from crack cocaine [facility])   Pulmonary infiltrates    Discharge Instructions   Allergies as of 07/23/2022   No Known Allergies      Medication List     STOP taking these medications    naproxen 500 MG EC tablet Commonly known as: EC NAPROSYN       TAKE these medications    acetaminophen 325 MG tablet Commonly known as: TYLENOL Take 650 mg by mouth every 6 (six) hours as needed for mild pain.   albuterol 108 (90 Base) MCG/ACT inhaler Commonly known as: VENTOLIN HFA Inhale 2 puffs into the lungs every 4 (four) hours as needed for wheezing or shortness of breath.   amLODipine 10 MG tablet Commonly known as: NORVASC Take 1 tablet (10 mg total) by mouth daily.   aspirin EC 325 MG tablet Take 325 mg by mouth daily.   atorvastatin 80 MG tablet  Commonly known as: LIPITOR Take 40 mg by mouth daily.   carboxymethylcellulose 0.5 % Soln Commonly known as: REFRESH PLUS Place 1 drop into both eyes 3 (three) times daily as needed.   carvedilol 6.25 MG tablet Commonly known as: COREG Take 6.25 mg by mouth 2 (two) times daily with a meal.   cholecalciferol 25 MCG (1000 UNIT) tablet Commonly known as: VITAMIN D3 Take 3,000 Units by  mouth daily.   diclofenac Sodium 1 % Gel Commonly known as: VOLTAREN Apply 2 g topically 4 (four) times daily as needed (foot pain).   DULoxetine 20 MG capsule Commonly known as: CYMBALTA Take 20 mg by mouth daily.   fluticasone 50 MCG/ACT nasal spray Commonly known as: FLONASE Place 2 sprays into both nostrils daily.   furosemide 20 MG tablet Commonly known as: Lasix Please take 1 tablet by mouth twice daily x 2 days, then 1 tablet daily thereafter   guaiFENesin 600 MG 12 hr tablet Commonly known as: MUCINEX Take 600 mg by mouth 2 (two) times daily as needed for cough.   lidocaine 5 % ointment Commonly known as: XYLOCAINE Apply 1 Application topically 2 (two) times daily as needed for mild pain or moderate pain.   loratadine 10 MG tablet Commonly known as: CLARITIN Take 10 mg by mouth daily.   metFORMIN 500 MG tablet Commonly known as: GLUCOPHAGE Take 500 mg by mouth 2 (two) times daily with a meal.   nortriptyline 10 MG capsule Commonly known as: PAMELOR Take 20 mg by mouth at bedtime.   pantoprazole 40 MG tablet Commonly known as: Protonix Take 1 tablet (40 mg total) by mouth daily.   polyethylene glycol powder 17 GM/SCOOP powder Commonly known as: GLYCOLAX/MIRALAX Take 17 g by mouth 2 (two) times daily as needed for mild constipation.   predniSONE 10 MG tablet Commonly known as: DELTASONE Please take 1 tablet (10 mg) oral daily x 3 days, then 0.5 tablet (5 mg) oral daily x 3 days then stop   pregabalin 200 MG capsule Commonly known as: LYRICA Take 1 capsule (200 mg total) by mouth 2 (two) times daily.   tamsulosin 0.4 MG Caps capsule Commonly known as: FLOMAX Take 0.4 mg by mouth daily.   valsartan 80 MG tablet Commonly known as: DIOVAN Take 1 tablet (80 mg total) by mouth in the morning, at noon, and at bedtime. What changed:  how much to take when to take this additional instructions               Durable Medical Equipment  (From  admission, onward)           Start     Ordered   07/23/22 1011  For home use only DME oxygen  Once       Question Answer Comment  Length of Need 6 Months   Mode or (Route) Nasal cannula   Liters per Minute 3   Frequency Continuous (stationary and portable oxygen unit needed)   Oxygen conserving device Yes   Oxygen delivery system Gas      07/23/22 1010            No Known Allergies  Consultations: PCCM   Procedures/Studies: DG Chest Port 1 View  Result Date: 07/20/2022 CLINICAL DATA:  Pulmonary infiltrates EXAM: PORTABLE CHEST 1 VIEW COMPARISON:  07/18/2022 FINDINGS: Heart borderline in size. Mediastinal contours within normal limits. Perihilar and lower lobe opacities slightly improved, likely improving edema. No effusions or acute bony abnormality. IMPRESSION: Improving pulmonary edema  pattern. Electronically Signed   By: Charlett Nose M.D.   On: 07/20/2022 07:16   DG Chest Port 1 View  Result Date: 07/18/2022 CLINICAL DATA:  Shortness of breath EXAM: PORTABLE CHEST 1 VIEW COMPARISON:  07/16/2022 x-ray FINDINGS: Underinflated x-ray. Enlarged cardiopericardial silhouette with increasing vascular congestion and edema compared to previous. More confluence opacity also seen along both lung bases. Air bronchograms in the left lung base. No pneumothorax or effusion. Overlapping cardiac leads. Loop recorder along the left hemithorax IMPRESSION: Slight increase in interstitial changes and vascular congestion Electronically Signed   By: Karen Kays M.D.   On: 07/18/2022 10:49   DG Chest Port 1 View  Result Date: 07/16/2022 CLINICAL DATA:  Hypoxia EXAM: PORTABLE CHEST 1 VIEW COMPARISON:  Chest x-ray dated Jul 14, 2022 FINDINGS: Cardiac and mediastinal contours are unchanged. Loop recorder device. Similar diffuse interstitial opacities. New small left pleural effusion. No evidence of pneumothorax. IMPRESSION: 1. New small left pleural effusion. 2. Unchanged interstitial opacities,  likely due to pulmonary edema. Electronically Signed   By: Allegra Lai M.D.   On: 07/16/2022 12:55   US Abdomen Limited RUQ (LIVER/GB)  Result Date: 07/14/2022 CLINICAL DATA:  Transaminitis EXAM: ULTRASOUND ABDOMEN LIMITED RIGHT UPPER QUADRANT COMPARISON:  None Available. FINDINGS: Gallbladder: Distended gallbladder with numerous shadowing stones. Some areas of wall echo shadow. No adjacent fluid. Mild wall thickening. Common bile duct: Diameter: 4 mm Liver: Diffusely echogenic hepatic parenchyma consistent with fatty liver infiltration. With this level of echogenicity evaluation for underlying mass lesion is limited and if needed follow-up contrast CT or MRI as clinically directed. Portal vein is patent on color Doppler imaging with normal direction of blood flow towards the liver. Other: None. IMPRESSION: Fatty liver infiltration. Gallbladder filled with stones. No ductal dilatation. Mild gallbladder wall thickening. Further workup as clinically directed such as HIDA scan Electronically Signed   By: Karen Kays M.D.   On: 07/14/2022 15:38   DG CHEST PORT 1 VIEW  Result Date: 07/14/2022 CLINICAL DATA:  [id number] with hypoxic respiratory failure. EXAM: PORTABLE CHEST 1 VIEW COMPARISON:  Portable chest yesterday at 9:42 a.m. FINDINGS: 4:30 a.m. Implanted loop recorder device again noted left chest wall. The heart is enlarged. Mild perihilar vascular congestion, interstitial edema with a basal gradient are still seen but improved. There is patchy hazy opacity in the lower lung fields which was previously denser and more confluent. There are bilateral minimal pleural effusions which appear improved. The mediastinum is stable. The upper lung fields are now clear. Thoracic cage is intact. Overlying monitor wires. IMPRESSION: 1. Improved perihilar vascular congestion, interstitial edema and pleural effusions. 2. Persistent patchy hazy opacity in the lower lung fields which was previously denser and more  confluent. Electronically Signed   By: Almira Bar M.D.   On: 07/14/2022 05:46   DG CHEST PORT 1 VIEW  Result Date: 07/13/2022 CLINICAL DATA:  Hypoxia. EXAM: PORTABLE CHEST 1 VIEW COMPARISON:  07/11/2022. FINDINGS: 0942 hours. Implantable loop recorder projects over the left heart. Worsening bilateral airspace disease and new trace bilateral pleural effusions. No pneumothorax. Stable cardiomegaly and mediastinal contours. IMPRESSION: Worsening bilateral airspace disease and new trace bilateral pleural effusions. Electronically Signed   By: Orvan Falconer M.D.   On: 07/13/2022 11:44   ECHOCARDIOGRAM COMPLETE  Result Date: 07/11/2022    ECHOCARDIOGRAM REPORT   Patient Name:   [facility] Date of Exam: 07/11/2022 Medical Rec #:  [id number]        Height:  65.0 in Accession #:    [id number]       Weight:       186.7 lb Date of Birth:  Mar 15, 1955        BSA:          1.921 m Patient Age:    66 years         BP:           172/100 mmHg Patient Gender: M                HR:           97 bpm. Exam Location:  Inpatient Procedure: 2D Echo, Cardiac Doppler and Color Doppler Indications:    Respiratory distress  History:        Patient has prior history of Echocardiogram examinations, most                 recent 07/17/2021. Risk Factors:Hypertension.  Sonographer:    Lucy Antigua Referring Phys: Simonne Martinet IMPRESSIONS  1. Normal LV function; calcified aortic valve not well interrogated (mean gradient 3 mmHg but previously 27 mmHg 07/17/21; suggest FU limited study with focus on aortic valve/AS when pt more stable).  2. Left ventricular ejection fraction, by estimation, is 60 to 65%. The left ventricle has normal function. The left ventricle has no regional wall motion abnormalities. There is moderate concentric left ventricular hypertrophy. Left ventricular diastolic parameters are consistent with Grade I diastolic dysfunction (impaired relaxation). Elevated left atrial pressure.  3. Right ventricular systolic  function is normal. The right ventricular size is normal.  4. The mitral valve is normal in structure. No evidence of mitral valve regurgitation. No evidence of mitral stenosis.  5. The aortic valve is calcified. Aortic valve regurgitation is not visualized. No aortic stenosis is present.  6. The inferior vena cava is normal in size with greater than 50% respiratory variability, suggesting right atrial pressure of 3 mmHg. FINDINGS  Left Ventricle: Left ventricular ejection fraction, by estimation, is 60 to 65%. The left ventricle has normal function. The left ventricle has no regional wall motion abnormalities. The left ventricular internal cavity size was normal in size. There is  moderate concentric left ventricular hypertrophy. Left ventricular diastolic parameters are consistent with Grade I diastolic dysfunction (impaired relaxation). Elevated left atrial pressure. Right Ventricle: The right ventricular size is normal. Right ventricular systolic function is normal. Left Atrium: Left atrial size was normal in size. Right Atrium: Right atrial size was normal in size. Pericardium: There is no evidence of pericardial effusion. Mitral Valve: The mitral valve is normal in structure. No evidence of mitral valve regurgitation. No evidence of mitral valve stenosis. Tricuspid Valve: The tricuspid valve is normal in structure. Tricuspid valve regurgitation is not demonstrated. No evidence of tricuspid stenosis. Aortic Valve: The aortic valve is calcified. Aortic valve regurgitation is not visualized. No aortic stenosis is present. Aortic valve mean gradient measures 3.0 mmHg. Aortic valve peak gradient measures 4.7 mmHg. Aortic valve area, by VTI measures 4.04 cm. Pulmonic Valve: The pulmonic valve was normal in structure. Pulmonic valve regurgitation is not visualized. No evidence of pulmonic stenosis. Aorta: The aortic root is normal in size and structure. Venous: The inferior vena cava is normal in size with greater  than 50% respiratory variability, suggesting right atrial pressure of 3 mmHg. IAS/Shunts: No atrial level shunt detected by color flow Doppler. Additional Comments: Normal LV function; calcified aortic valve not well interrogated (mean gradient 3 mmHg but previously 27  mmHg 07/17/21; suggest FU limited study with focus on aortic valve/AS when pt more stable).  LEFT VENTRICLE PLAX 2D LVIDd:         4.70 cm     Diastology LVIDs:         3.00 cm     LV e' medial:    5.22 cm/s LV PW:         1.30 cm     LV E/e' medial:  18.1 LV IVS:        1.20 cm     LV e' lateral:   4.13 cm/s LVOT diam:     2.40 cm     LV E/e' lateral: 22.9 LV SV:         86 LV SV Index:   45 LVOT Area:     4.52 cm  LV Volumes (MOD) LV vol d, MOD A2C: 66.3 ml LV vol d, MOD A4C: 84.2 ml LV vol s, MOD A2C: 26.9 ml LV vol s, MOD A4C: 47.3 ml LV SV MOD A2C:     39.4 ml LV SV MOD A4C:     84.2 ml LV SV MOD BP:      41.2 ml RIGHT VENTRICLE             IVC RV S prime:     13.80 cm/s  IVC diam: 2.40 cm LEFT ATRIUM             Index        RIGHT ATRIUM           Index LA Vol (A2C):   42.7 ml 22.23 ml/m  RA Area:     15.60 cm LA Vol (A4C):   47.4 ml 24.67 ml/m  RA Volume:   40.60 ml  21.13 ml/m LA Biplane Vol: 45.5 ml 23.68 ml/m  AORTIC VALVE AV Area (Vmax):    3.82 cm AV Area (Vmean):   3.64 cm AV Area (VTI):     4.04 cm AV Vmax:           108.00 cm/s AV Vmean:          73.700 cm/s AV VTI:            0.214 m AV Peak Grad:      4.7 mmHg AV Mean Grad:      3.0 mmHg LVOT Vmax:         91.10 cm/s LVOT Vmean:        59.300 cm/s LVOT VTI:          0.191 m LVOT/AV VTI ratio: 0.89  AORTA Ao Root diam: 3.10 cm Ao Asc diam:  3.50 cm MITRAL VALVE MV Area (PHT): 5.02 cm     SHUNTS MV Decel Time: 151 msec     Systemic VTI:  0.19 m MV E velocity: 94.40 cm/s   Systemic Diam: 2.40 cm MV A velocity: 133.00 cm/s MV E/A ratio:  0.71 Olga Millers MD Electronically signed by Olga Millers MD Signature Date/Time: 07/11/2022/11:57:50 AM    Final    DG Chest Port 1  View  Result Date: 07/11/2022 CLINICAL DATA:  67 year old male with history of shortness of breath. EXAM: PORTABLE CHEST 1 VIEW COMPARISON:  Chest x-ray 07/10/2022. FINDINGS: Lung volumes are normal. Diffuse interstitial prominence and patchy multifocal airspace disease, most evident in the lower lungs. Overall, aeration appears slightly improved compared to the prior examination. No definite pleural effusions. No pneumothorax. Heart size is mildly enlarged. Upper mediastinal contours are within normal  limits. Small electronic device projecting over the left hemithorax, presumably an implantable loop recorder. IMPRESSION: 1. Slight improved aeration in the lungs, which may suggest resolving pulmonary edema and/or resolving multilobar bilateral bronchopneumonia. Electronically Signed   By: Trudie Reed M.D.   On: 07/11/2022 05:40   CT Angio Chest PE W and/or Wo Contrast  Result Date: 07/10/2022 CLINICAL DATA:  Positive D-dimer with shortness of breath. EXAM: CT ANGIOGRAPHY CHEST WITH CONTRAST TECHNIQUE: Multidetector CT imaging of the chest was performed using the standard protocol during bolus administration of intravenous contrast. Multiplanar CT image reconstructions and MIPs were obtained to evaluate the vascular anatomy. RADIATION DOSE REDUCTION: This exam was performed according to the departmental dose-optimization program which includes automated exposure control, adjustment of the mA and/or kV according to patient size and/or use of iterative reconstruction technique. CONTRAST:  75mL OMNIPAQUE IOHEXOL 350 MG/ML SOLN COMPARISON:  CT PE protocol 07/16/2021. CT abdomen and pelvis 05/16/2017. FINDINGS: Cardiovascular: Satisfactory opacification of the pulmonary arteries to the segmental level. No evidence of pulmonary embolism. Heart is mildly enlarged, unchanged. No pericardial effusion. There are atherosclerotic calcifications of the aorta. Mediastinum/Nodes: Enlarged subcarinal lymph node measuring 16  mm short axis, increased in size. Enlarged paratracheal lymph nodes measuring up to 11 mm, increased in size. There are enlarged prevascular lymph nodes measuring up to 17 mm, similar to prior. Visualized esophagus and thyroid gland are within normal limits. Lungs/Pleura: Diffuse multifocal ground-glass opacities with some associated smooth interlobular septal thickening seen throughout both lungs. There is some more patchy airspace disease in the bilateral lower lobes. There is no pleural effusion or pneumothorax. Trachea and central airways are patent. Upper Abdomen: There is an indeterminate 15 mm right adrenal nodule which is unchanged. No acute abnormality. Musculoskeletal: No chest wall abnormality. No acute or significant osseous findings. Review of the MIP images confirms the above findings. IMPRESSION: 1. No evidence for pulmonary embolism. 2. Diffuse multifocal ground-glass opacities with smooth interlobular septal thickening throughout both lungs. Findings are nonspecific and can be seen in the setting of pulmonary edema, atypical infection, hypersensitivity pneumonitis, and other etiologies. 3. Patchy airspace disease in the bilateral lower lobes may represent atelectasis or infection. 4. Stable mild cardiomegaly. 5. Stable mediastinal lymphadenopathy. 6. Stable indeterminate right adrenal favored as benign given stability. This can be further characterized with adrenal CT or MRI. Aortic Atherosclerosis (ICD10-I70.0). Electronically Signed   By: Darliss Cheney M.D.   On: 07/10/2022 17:52   DG Chest Portable 1 View  Result Date: 07/10/2022 CLINICAL DATA:  Shortness of breath EXAM: PORTABLE CHEST 1 VIEW COMPARISON:  CTA chest 07/16/21, CXR 08/08/21 FINDINGS: Redemonstrated are extensive bilateral hazy airspace opacities appear unchanged compared to 07/16/2021. Cardiomegaly. No radiographically apparent displaced rib fractures. Visualized upper abdomen is unremarkable. Loop recorder device projects over the  left hemithorax. Visualized upper abdomen is unremarkable. IMPRESSION: 1. No significant interval change in extensive bilateral hazy airspace opacities compared to 07/16/2021. 2. Cardiomegaly. 3. No radiographically apparent displaced rib fractures. Electronically Signed   By: Lorenza Cambridge M.D.   On: 07/10/2022 13:10      Subjective: No significant events overnight, he denies any complaints today, he is eager to go home.  Discharge Exam: Vitals:   07/23/22 0747 07/23/22 0800  BP: (!) 160/101   Pulse:  81  Resp:    Temp: 98 F (36.7 C)   SpO2:     Vitals:   07/23/22 0344 07/23/22 0400 07/23/22 0747 07/23/22 0800  BP: (!) 164/95 (!) 163/92 Marland Kitchen)  160/101   Pulse: 84 81  81  Resp: 16 20    Temp:  98.1 F (36.7 C) 98 F (36.7 C)   TempSrc:  Oral Oral   SpO2: 91% 94%    Weight:      Height:        General: Pt is alert, awake, not in acute distress Cardiovascular: RRR, S1/S2 +, no rubs, no gallops Respiratory: CTA bilaterally, no wheezing, no rhonchi Abdominal: Soft, NT, ND, bowel sounds + Extremities: no edema, no cyanosis    The results of significant diagnostics from this hospitalization (including imaging, microbiology, ancillary and laboratory) are listed below for reference.     Microbiology: No results found for this or any previous visit (from the past 240 hour(s)).   Labs: BNP (last 3 results) Recent Labs    07/20/22 0318 07/21/22 0332 07/22/22 0423  BNP 51.6 36.0 50.2   Basic Metabolic Panel: Recent Labs  Lab 07/17/22 0059 07/18/22 0438 07/19/22 0231 07/20/22 0318 07/21/22 0332 07/22/22 0423 07/23/22 0721  NA 134* 133* 134* 134* 135 134* 132*  K 3.8 3.7 3.6 3.6 4.0 4.2 3.8  CL 99 100 99 97* 99 101 100  CO2 24 21* 23 22 22  20* 20*  GLUCOSE 124* 131* 119* 143* 105* 136* 195*  BUN 44* 32* 30* 35* 32* 31* 29*  CREATININE 1.49* 1.06 1.04 1.19 1.08 1.28* 1.19  CALCIUM 9.2 9.3 9.6 9.9 9.7 9.6 9.3  MG 2.6* 2.4 2.3 2.2 2.3 2.1  --   PHOS 4.5 3.9  --    --   --   --   --    Liver Function Tests: No results for input(s): "AST", "ALT", "ALKPHOS", "BILITOT", "PROT", "ALBUMIN" in the last 168 hours. No results for input(s): "LIPASE", "AMYLASE" in the last 168 hours. No results for input(s): "AMMONIA" in the last 168 hours. CBC: Recent Labs  Lab 07/19/22 0231 07/20/22 0318 07/21/22 0332 07/22/22 0423 07/23/22 0721  WBC 20.3* 21.1* 21.9* 19.7* 18.1*  NEUTROABS 15.5* 15.9* 16.4* 14.4* 13.0*  HGB 14.4 15.1 15.2 15.7 14.9  HCT 44.0 45.6 47.2 48.9 45.3  MCV 79.7* 78.9* 79.7* 80.0 79.5*  PLT 427* 442* 449* 392 405*   Cardiac Enzymes: No results for input(s): "CKTOTAL", "CKMB", "CKMBINDEX", "TROPONINI" in the last 168 hours. BNP: Invalid input(s): "POCBNP" CBG: Recent Labs  Lab 07/22/22 0832 07/22/22 1242 07/22/22 1621 07/22/22 2112 07/23/22 0746  GLUCAP 186* 158* 153* 157* 232*   D-Dimer No results for input(s): "DDIMER" in the last 72 hours. Hgb A1c No results for input(s): "HGBA1C" in the last 72 hours. Lipid Profile No results for input(s): "CHOL", "HDL", "LDLCALC", "TRIG", "CHOLHDL", "LDLDIRECT" in the last 72 hours. Thyroid function studies No results for input(s): "TSH", "T4TOTAL", "T3FREE", "THYROIDAB" in the last 72 hours.  Invalid input(s): "FREET3" Anemia work up No results for input(s): "VITAMINB12", "FOLATE", "FERRITIN", "TIBC", "IRON", "RETICCTPCT" in the last 72 hours. Urinalysis    Component Value Date/Time   COLORURINE STRAW (A) 08/08/2021 0825   APPEARANCEUR CLEAR 08/08/2021 0825   LABSPEC 1.004 (L) 08/08/2021 0825   PHURINE 7.0 08/08/2021 0825   GLUCOSEU 50 (A) 08/08/2021 0825   HGBUR NEGATIVE 08/08/2021 0825   BILIRUBINUR NEGATIVE 08/08/2021 0825   KETONESUR NEGATIVE 08/08/2021 0825   PROTEINUR NEGATIVE 08/08/2021 0825   UROBILINOGEN 0.2 08/26/2012 1452   NITRITE NEGATIVE 08/08/2021 0825   LEUKOCYTESUR NEGATIVE 08/08/2021 0825   Sepsis Labs Recent Labs  Lab 07/20/22 1027 07/21/22 2536  07/22/22 0423 07/23/22 6440  WBC 21.1* 21.9* 19.7* 18.1*   Microbiology No results found for this or any previous visit (from the past 240 hour(s)).   Time coordinating discharge: Over 30 minutes  SIGNED:   Huey Bienenstock, MD  [facility] 07/23/2022, 10:37 AM Pager   If 7PM-7AM, please contact night-coverage [email or web address] Password [email or web address]

## 2022-07-23 NOTE — Progress Notes (Signed)
    Durable Medical Equipment  (From admission, onward)           Start     Ordered   07/23/22 1011  For home use only DME oxygen  Once       Question Answer Comment  Length of Need 6 Months   Mode or (Route) Nasal cannula   Liters per Minute 3   Frequency Continuous (stationary and portable oxygen unit needed)   Oxygen conserving device Yes   Oxygen delivery system Gas      07/23/22 1010           SATURATION QUALIFICATIONS: (This note is used to comply with regulatory documentation for home oxygen)  Patient Saturations on Room Air at Rest = *88% RA  Patient Saturations on Room Air while Ambulating = 76% RA  Patient Saturations on 3 Liters of oxygen while Ambulating = 95%  Please briefly explain why patient needs home oxygen: this patient can not maintain acceptable oxygen saturation level on room air at rest, oxygen is required

## 2022-07-23 NOTE — Plan of Care (Signed)
Patient discharging home with spouse

## 2022-07-23 NOTE — Progress Notes (Signed)
SATURATION QUALIFICATIONS: (This note is used to comply with regulatory documentation for home oxygen)  Patient Saturations on Room Air at Rest = 89%  Patient Saturations on Room Air while Ambulating = 82%  Patient Saturations on 4 Liters of oxygen while Ambulating = 93%  Please briefly explain why patient needs home oxygen: Pt desaturates on room air with mobility and requires supplemental oxygen to maintain a safe saturation level.  Lenora Boys. PTA Acute Rehabilitation Services Office: [phone]

## 2022-07-23 NOTE — Progress Notes (Signed)
Discharge teaching completed and both patient and spouse deny questions or complaints. Patient discharged with home 02 and meds from discharge pharmacy.

## 2022-07-23 NOTE — Progress Notes (Signed)
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 PMH - chf, COPD, HTN, PTSD, substance abuse, aortic valve stenosis, ARDS    PT Comments    Pt greeted up in chair and agreeable to session with continued progress towards acute goals. Pt requiring grossly min guard for functional transfers and gait without AD support with pt able to push O2 tank without fault. Pt continues to be limited by decreased cardiopulmonary endurance, needing cues for self pacing and breathing techniques throughout activity. Pt continues to require supplemtal O2 during activity to maintain safe saturation levels (see saturation qualifications note). Pt continues to benefit from skilled PT services to progress toward functional mobility goals.    Recommendations for follow up therapy are one component of a multi-disciplinary discharge planning process, led by the attending physician.  Recommendations may be updated based on patient status, additional functional criteria and insurance authorization.  Follow Up Recommendations       Assistance Recommended at Discharge Intermittent Supervision/Assistance  Patient can return home with the following Help with stairs or ramp for entrance;A little help with walking and/or transfers   Equipment Recommendations  None recommended by PT    Recommendations for Other Services       Precautions / Restrictions Precautions Precautions: Other (comment) Precaution Comments: HFNC Restrictions Weight Bearing Restrictions: No     Mobility  Bed Mobility Overal bed mobility: Needs Assistance             General bed mobility comments: pt seated in recliner upon arrival, ended session with pt in  recliner    Transfers Overall transfer level: Needs assistance Equipment used: None Transfers: Sit to/from Stand Sit to Stand: Supervision           General transfer comment: supervision for safety and line management, coming to stand and able to maintain without UE support    Ambulation/Gait Ambulation/Gait assistance: Supervision Gait Distance (Feet): 400 Feet Assistive device: None (pt pushing O2 tank) Gait Pattern/deviations: Step-through pattern, Decreased stride length, Drifts right/left Gait velocity: decreased     General Gait Details: supervision for safety and line management. pt able to manage O2 tank with light cues   Stairs             Wheelchair Mobility    Modified Rankin (Stroke Patients Only)       Balance Overall balance assessment: Needs assistance Sitting-balance support: No upper extremity supported, Feet supported Sitting balance-Leahy Scale: Good     Standing balance support: No upper extremity supported, During functional activity Standing balance-Leahy Scale: Fair                              Cognition Arousal/Alertness: Awake/alert Behavior During Therapy: WFL for tasks assessed/performed Overall Cognitive Status: Within Functional Limits for tasks assessed                                 General Comments: can be impulsive and eager for mobility needing increased cues to slow and for increased safety awareness        Exercises      General Comments General comments (skin integrity, edema, etc.): SpO2 96%  on 3 L on arrival, dropping to 88% on RA at rest and 84% on RA with activity, applied 3L with O2 not recovering, increased to 4L with O2 imprpving to >90% and maintaining at 93% during activity, replaced 3L at end of session with SpO2 95% at rest.      Pertinent Vitals/Pain Pain Assessment Pain Assessment: No/denies pain    Home Living                          Prior Function             PT Goals (current goals can now be found in the care plan section) Acute Rehab PT Goals Patient Stated Goal: return home PT Goal Formulation: With patient Time For Goal Achievement: 07/30/22 Progress towards PT goals: Progressing toward goals    Frequency    Min 3X/week      PT Plan Current plan remains appropriate    Co-evaluation              AM-PAC PT "6 Clicks" Mobility   Outcome Measure  Help needed turning from your back to your side while in a flat bed without using bedrails?: None Help needed moving from lying on your back to sitting on the side of a flat bed without using bedrails?: A Little Help needed moving to and from a bed to a chair (including a wheelchair)?: A Little Help needed standing up from a chair using your arms (e.g., wheelchair or bedside chair)?: A Little Help needed to walk in hospital room?: A Little Help needed climbing 3-5 steps with a railing? : A Little 6 Click Score: 19    End of Session Equipment Utilized During Treatment: Oxygen Activity Tolerance: Patient tolerated treatment well Patient left: in chair;with call bell/phone within reach;with family/visitor present Nurse Communication: Mobility status;Other (comment) (SpO2 requirement) PT Visit Diagnosis: Other abnormalities of gait and mobility (R26.89);Muscle weakness (generalized) (M62.81)     Time: 1610-9604 PT Time Calculation (min) (ACUTE ONLY): 17 min  Charges:  $Therapeutic Activity: 8-22 mins                    Tayquan Gassman R. PTA Acute Rehabilitation Services Office: [phone]   Catalina Antigua 07/23/2022, 12:43 PM

## 2022-07-23 NOTE — TOC Transition Note (Addendum)
Transition of Care [facility]) - CM/SW Discharge Note   Patient Details  Name: Zachary Briggs MRN: [id number] Date of Birth: Mar 13, 1955  [facility]) CM/SW Contact:  Lawerance Sabal, RN Phone Number: 07/23/2022, 11:13 AM   Clinical Narrative:     Spoke w patient and visitor at bedside.  Patient qualifies home oxygen.  Discussed process with patient and [employer] will deliver oxygen to the room prior to discharge.  Meds for DC through [facility] pharmacy No other TOC needs identified for DC.   Final next level of care: Home/Self Care Barriers to Discharge: No Barriers Identified   Patient Goals and CMS Choice CMS Medicare.gov Compare Post Acute Care list provided to:: Patient Choice offered to / list presented to : Patient  Discharge Placement                         Discharge Plan and Services Additional resources added to the After Visit Summary for   In-house Referral: Clinical Social Work   Post Acute Care Choice: Long Term Acute Care (LTAC)          DME Arranged: Oxygen DME Agency: [employer] Date DME Agency Contacted: 07/23/22 Time DME Agency Contacted: 1109 Representative spoke with at DME Agency: Vaughan Basta            Social Determinants of Health (SDOH) Interventions SDOH Screenings   Tobacco Use: High Risk (07/10/2022)     Readmission Risk Interventions    07/31/2021    1:32 PM  Readmission Risk Prevention Plan  Transportation Screening Complete  PCP or Specialist Appt within 3-5 Days Complete  HRI or Home Care Consult Complete  Social Work Consult for Recovery Care Planning/Counseling Complete  Palliative Care Screening Not Applicable  Medication Review [employer]) Referral to Pharmacy

## 2022-07-28 DIAGNOSIS — R6889 Other general symptoms and signs: Secondary | ICD-10-CM | POA: Diagnosis not present

## 2022-08-08 DIAGNOSIS — R6889 Other general symptoms and signs: Secondary | ICD-10-CM | POA: Diagnosis not present

## 2022-08-21 DIAGNOSIS — R6889 Other general symptoms and signs: Secondary | ICD-10-CM | POA: Diagnosis not present

## 2022-08-28 DIAGNOSIS — R6889 Other general symptoms and signs: Secondary | ICD-10-CM | POA: Diagnosis not present

## 2022-10-06 ENCOUNTER — Encounter: Payer: Self-pay | Admitting: Podiatry

## 2022-10-06 ENCOUNTER — Ambulatory Visit (INDEPENDENT_AMBULATORY_CARE_PROVIDER_SITE_OTHER): Payer: No Typology Code available for payment source | Admitting: Podiatry

## 2022-10-06 VITALS — BP 174/82 | HR 91

## 2022-10-06 DIAGNOSIS — B351 Tinea unguium: Secondary | ICD-10-CM | POA: Diagnosis not present

## 2022-10-06 DIAGNOSIS — M79674 Pain in right toe(s): Secondary | ICD-10-CM | POA: Diagnosis not present

## 2022-10-06 DIAGNOSIS — M79675 Pain in left toe(s): Secondary | ICD-10-CM | POA: Diagnosis not present

## 2022-10-06 DIAGNOSIS — E1165 Type 2 diabetes mellitus with hyperglycemia: Secondary | ICD-10-CM

## 2022-10-06 NOTE — Progress Notes (Signed)
This patient returns to my office for at risk foot care.  This patient requires this care by a professional since this patient will be at risk due to having diabetes.  This patient is unable to cut nails himself since the patient cannot reach his nails.These nails are painful walking and wearing shoes.  This patient presents for at risk foot care today.  General Appearance  Alert, conversant and in no acute stress.  Vascular  Dorsalis pedis and posterior tibial  pulses are palpable  bilaterally.  Capillary return is within normal limits  bilaterally. Temperature is within normal limits  bilaterally.  Neurologic  Senn-Weinstein monofilament wire test within normal limits  bilaterally. Muscle power within normal limits bilaterally.  Nails Thick disfigured discolored nails with subungual debris  from hallux to fifth toes bilaterally. No evidence of bacterial infection or drainage bilaterally. Thick painful hallux nail right foot.  Orthopedic  No limitations of motion  feet .  No crepitus or effusions noted.  No bony pathology or digital deformities noted.  Skin  normotropic skin with no porokeratosis noted bilaterally.  No signs of infections or ulcers noted.     Onychomycosis  Pain in right toes  Pain in left toes  Consent was obtained for treatment procedures.   Mechanical debridement of nails 1-5  bilaterally performed with a nail nipper.  Filed with dremel without incident. Discuss possible surgery for permanent removal of right hallux nail.     Return office visit    10 weems                  Told patient to return for periodic foot care and evaluation due to potential at risk complications.   Helane Gunther DPM

## 2022-10-24 DIAGNOSIS — R6889 Other general symptoms and signs: Secondary | ICD-10-CM | POA: Diagnosis not present

## 2022-12-15 ENCOUNTER — Encounter: Payer: Self-pay | Admitting: Podiatry

## 2022-12-15 ENCOUNTER — Ambulatory Visit: Payer: No Typology Code available for payment source | Admitting: Podiatry

## 2022-12-15 DIAGNOSIS — E1165 Type 2 diabetes mellitus with hyperglycemia: Secondary | ICD-10-CM | POA: Diagnosis not present

## 2022-12-15 DIAGNOSIS — B351 Tinea unguium: Secondary | ICD-10-CM

## 2022-12-15 DIAGNOSIS — M79675 Pain in left toe(s): Secondary | ICD-10-CM | POA: Diagnosis not present

## 2022-12-15 DIAGNOSIS — M79674 Pain in right toe(s): Secondary | ICD-10-CM

## 2022-12-15 NOTE — Progress Notes (Signed)
This patient returns to my office for at risk foot care.  This patient requires this care by a professional since this patient will be at risk due to having diabetes.  This patient is unable to cut nails himself since the patient cannot reach his nails.These nails are painful walking and wearing shoes.  This patient presents for at risk foot care today.  General Appearance  Alert, conversant and in no acute stress.  Vascular  Dorsalis pedis and posterior tibial  pulses are palpable  bilaterally.  Capillary return is within normal limits  bilaterally. Temperature is within normal limits  bilaterally.  Neurologic  Senn-Weinstein monofilament wire test within normal limits  bilaterally. Muscle power within normal limits bilaterally.  Nails Thick disfigured discolored nails with subungual debris  from hallux to fifth toes bilaterally. No evidence of bacterial infection or drainage bilaterally. Thick painful hallux nail right foot.  Orthopedic  No limitations of motion  feet .  No crepitus or effusions noted.  No bony pathology or digital deformities noted.  Skin  normotropic skin with no porokeratosis noted bilaterally.  No signs of infections or ulcers noted.     Onychomycosis  Pain in right toes  Pain in left toes  Consent was obtained for treatment procedures.   Mechanical debridement of nails 1-5  bilaterally performed with a nail nipper.  Filed with dremel without incident.     Return office visit    10 wees                  Told patient to return for periodic foot care and evaluation due to potential at risk complications.   Helane Gunther DPM

## 2023-01-28 DIAGNOSIS — R6889 Other general symptoms and signs: Secondary | ICD-10-CM | POA: Diagnosis not present

## 2023-01-30 DIAGNOSIS — R6889 Other general symptoms and signs: Secondary | ICD-10-CM | POA: Diagnosis not present

## 2023-02-13 DIAGNOSIS — R6889 Other general symptoms and signs: Secondary | ICD-10-CM | POA: Diagnosis not present

## 2023-02-18 DIAGNOSIS — R6889 Other general symptoms and signs: Secondary | ICD-10-CM | POA: Diagnosis not present

## 2023-02-23 ENCOUNTER — Ambulatory Visit: Payer: Medicare HMO | Admitting: Podiatry

## 2023-03-10 DIAGNOSIS — R6889 Other general symptoms and signs: Secondary | ICD-10-CM | POA: Diagnosis not present

## 2023-03-27 DIAGNOSIS — R69 Illness, unspecified: Secondary | ICD-10-CM | POA: Diagnosis not present

## 2023-03-29 IMAGING — DX DG CHEST 2V
2 series · 2 of 2 positions shown · non-contrast
Comparison: Chest x-ray 07/25/2021.

CLINICAL DATA: 65-year-old male with history of pneumonia.
Weakness.

EXAM:
CHEST - 2 VIEW

[chest pa]
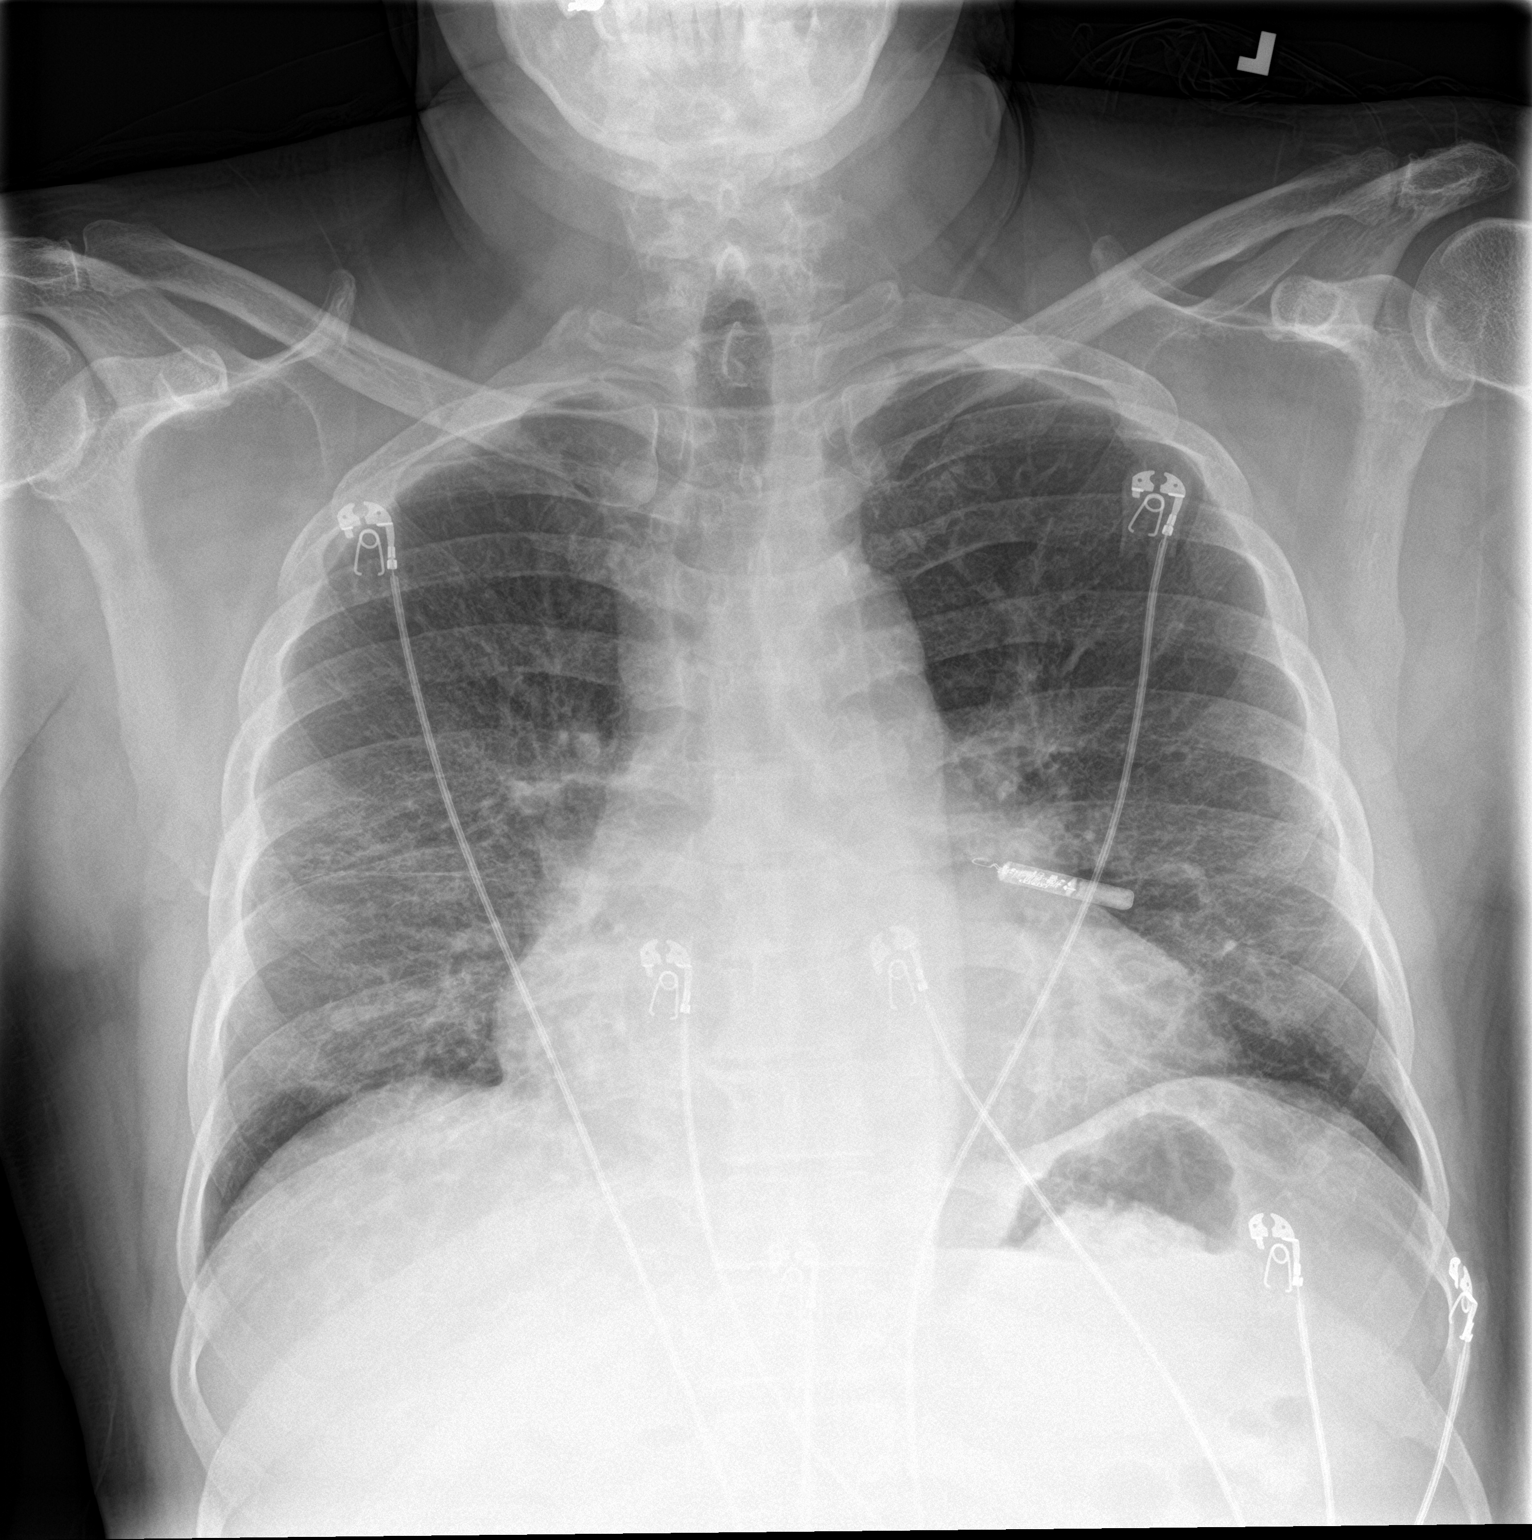

[chest lat]
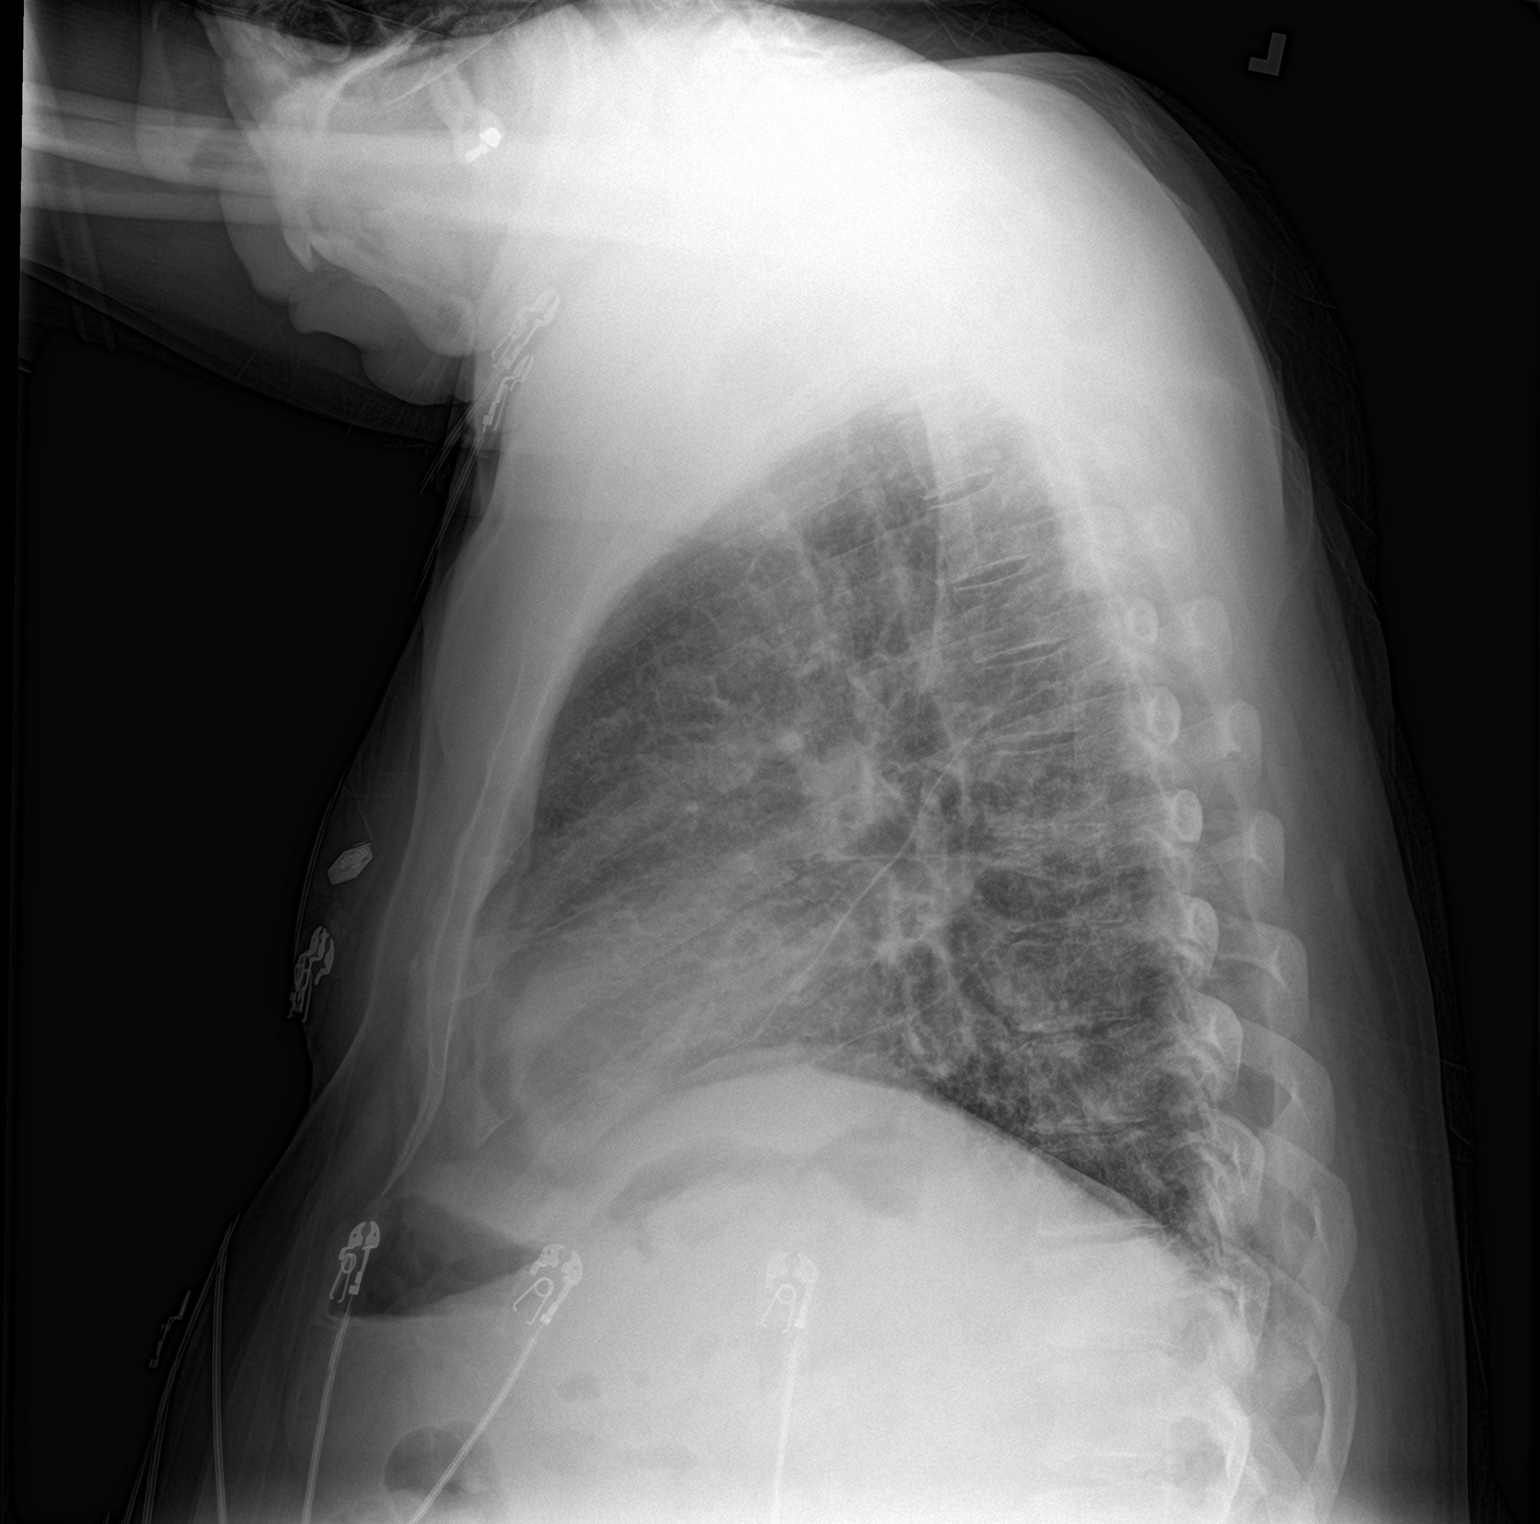

[2 of 2 positions shown; findings below may reference images not displayed]

FINDINGS: Lung volumes are slightly low. Ill-defined opacities and areas of
interstitial prominence are noted in the mid to lower lungs
bilaterally. Aeration has improved slightly compared to the prior
examination. No pleural effusions. No pneumothorax. No evidence of
pulmonary edema. Heart size is mildly enlarged. Upper mediastinal
contours are within normal limits. Small electronic device in the
subcutaneous fat of the left anterior chest wall, presumably an
implantable loop recorder. Previously noted endotracheal and feeding
tubes have been removed.
IMPRESSION: 1. Improving aeration in the lungs suggestive of resolving
multilobar bilateral pneumonia.
2. Mild cardiomegaly.

## 2023-03-29 IMAGING — MR MR HEAD W/O CM
9 of 10 series · 35 of 48 positions shown · non-contrast
Comparison: Prior CT from earlier the same day.

CLINICAL DATA: Initial evaluation for neuro deficit, speech
difficulty, left sided weakness.

EXAM:
MRI HEAD WITHOUT CONTRAST
TECHNIQUE: Multiplanar, multiecho pulse sequences of the brain and surrounding
structures were obtained without intravenous contrast.

[Series 3: DWI · axial · 3.0mm · 1.09mm/px · z∈[-100,+61]mm · 8 of 112 slices shown (1 of 4)]
[im 1/112]
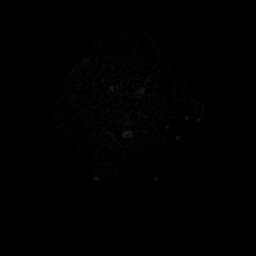
[im 13/112]
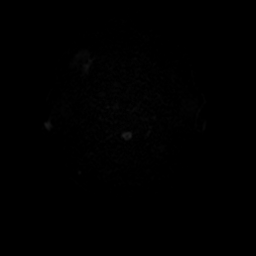
[im 38/112]
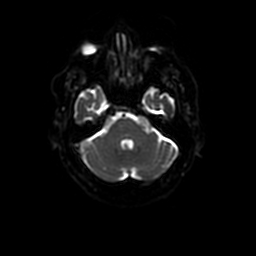
[im 50/112]
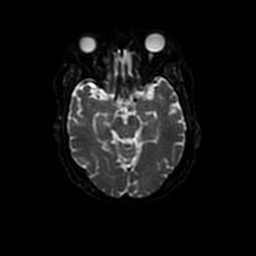
[im 62/112]
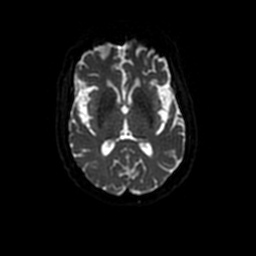
[im 75/112]
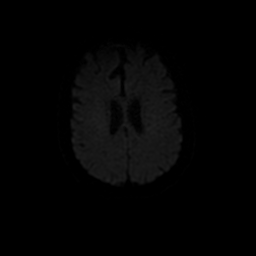
[im 99/112]
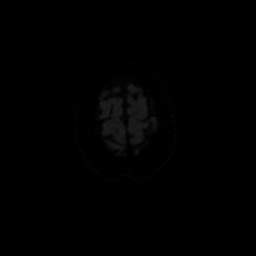
[im 112/112]
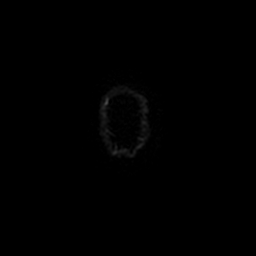

[Series 4: DWI · coronal · 5.0mm · 1.09mm/px · 7 of 76 slices shown (2 of 4)]
[im 1/76]
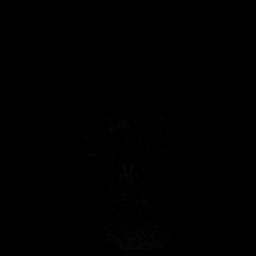
[im 13/76]
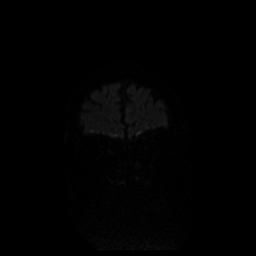
[im 26/76]
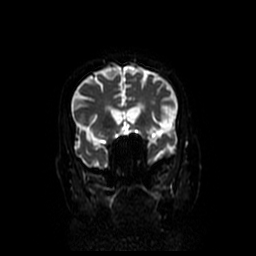
[im 38/76]
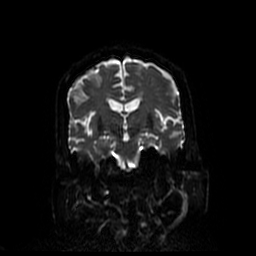
[im 51/76]
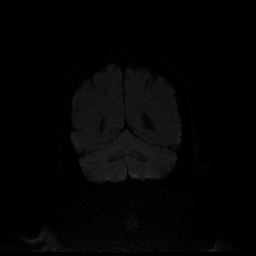
[im 63/76]
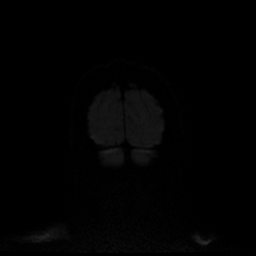
[im 76/76]
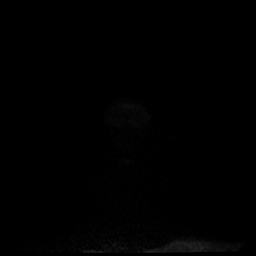

[Series 5: T1 · sagittal · 5.0mm · 0.47mm/px · 2 of 27 slices shown (1 of 2)]
[im 1/27]
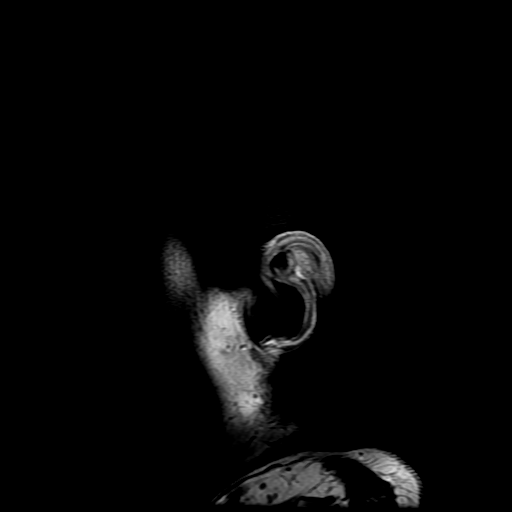
[im 27/27]
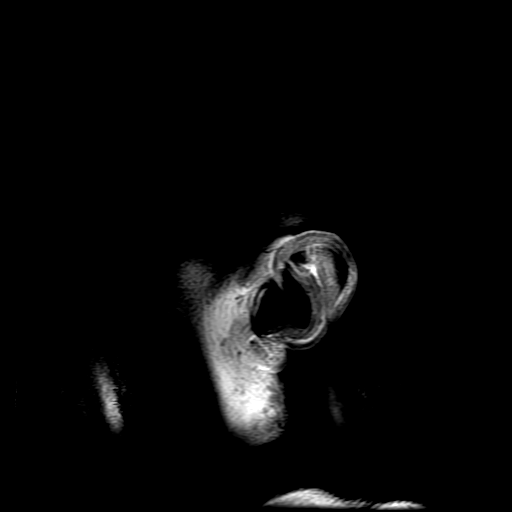

[Series 6: T2 · axial · 5.0mm · 0.43mm/px · z∈[-79,+68]mm · 2 of 26 slices shown (1 of 2)]
[im 1/26]
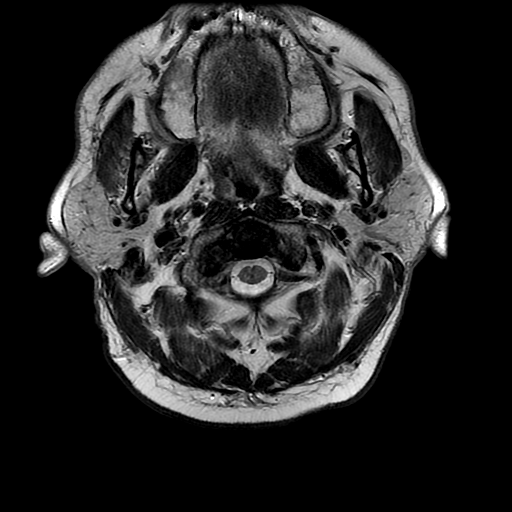
[im 26/26]
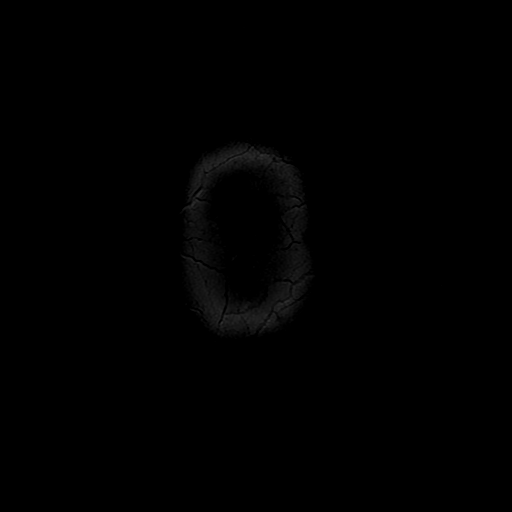

[Series 7: FLAIR · axial · 3.0mm · 0.43mm/px · z∈[-90,+68]mm · 3 of 28 slices shown]
[im 1/28]
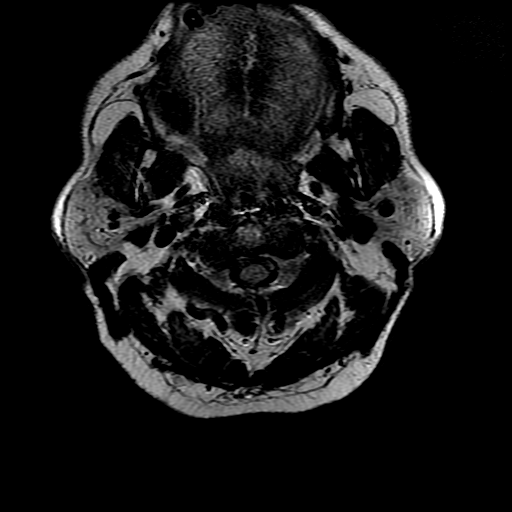
[im 14/28]
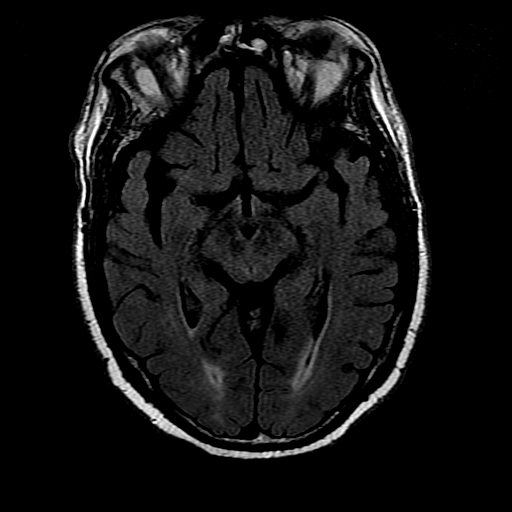
[im 28/28]
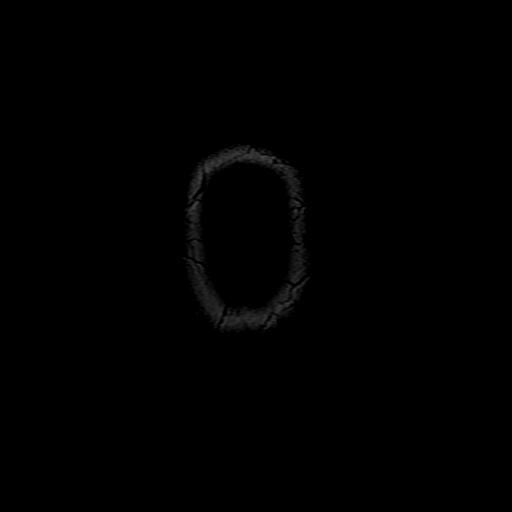

[Series 9: T1 · axial · 3.0mm · 0.47mm/px · 1 of 104 slices shown (2 of 2)]
[im 1/104]
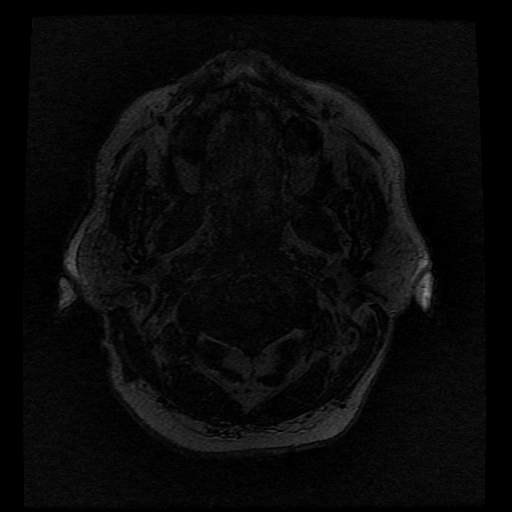

[Series 10: T2 · coronal · 5.0mm · 0.39mm/px · 3 of 29 slices shown (2 of 2)]
[im 1/29]
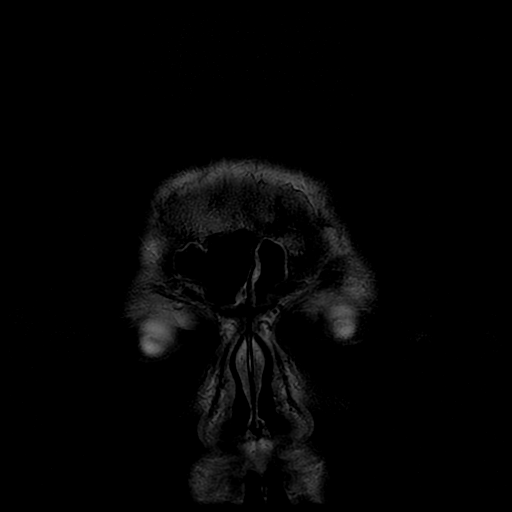
[im 15/29]
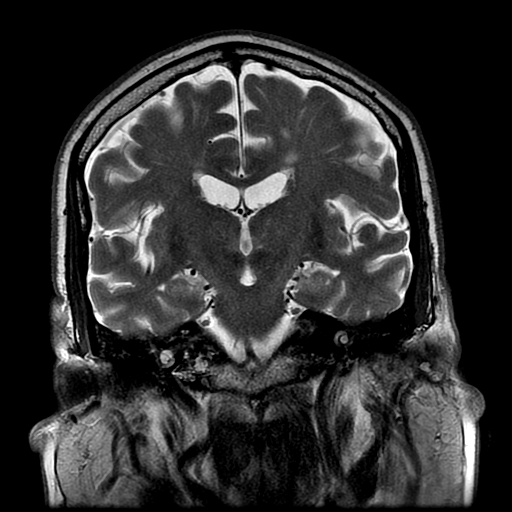
[im 29/29]
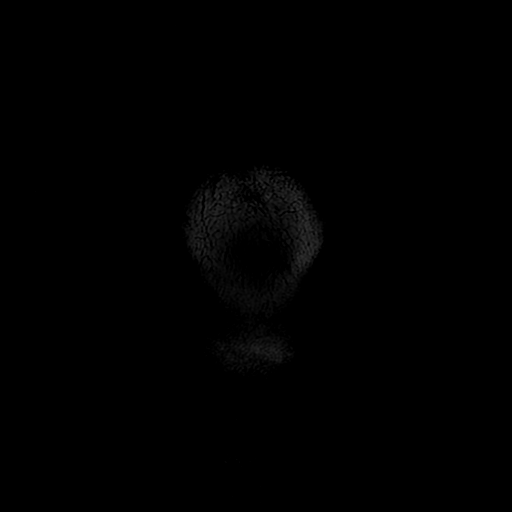

[Series 300: DWI · axial · 3.0mm · 1.09mm/px · z∈[-100,+61]mm · 5 of 56 slices shown (3 of 4)]
[im 1/56]
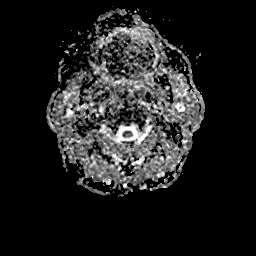
[im 14/56]
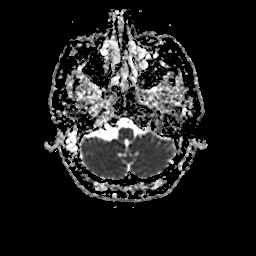
[im 28/56]
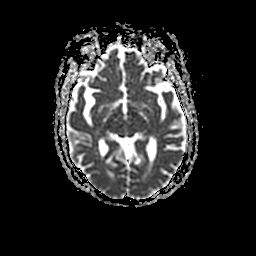
[im 42/56]
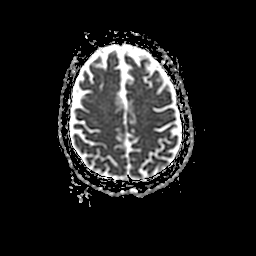
[im 56/56]
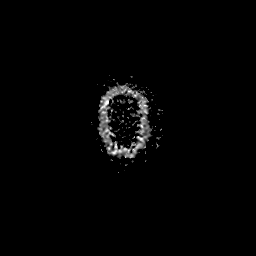

[Series 400: DWI · coronal · 5.0mm · 1.09mm/px · 4 of 38 slices shown (4 of 4)]
[im 1/38]
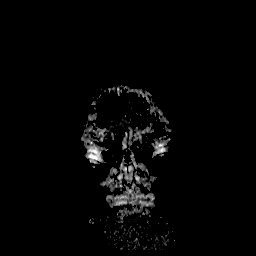
[im 13/38]
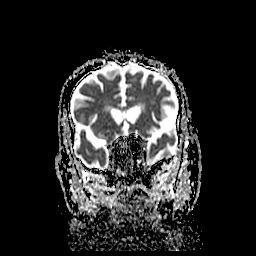
[im 25/38]
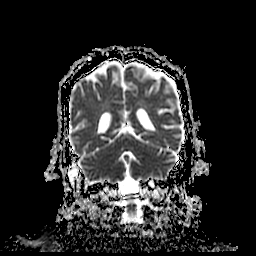
[im 38/38]
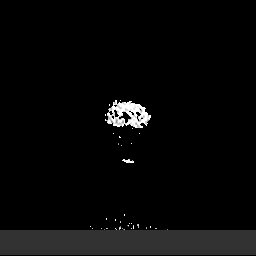

[35 of 48 positions shown; findings below may reference images not displayed]

FINDINGS: Brain: Cerebral volume within normal limits for age. Patchy T2/FLAIR
hyperintensity involving the periventricular deep white matter both
cerebral hemispheres, most like related chronic microvascular
ischemic disease, mild in nature. Few tiny remote cerebellar
infarcts noted.

No abnormal foci of restricted diffusion to suggest acute or
subacute ischemia. Apparent small focus of diffusion abnormality
involving the central aspect of the splenium on axial DWI sequence
image 33 noted, favored to be artifactual. Gray-white matter
differentiation maintained. No encephalomalacia to suggest chronic
cortical infarction. No acute or chronic intracranial blood
products.

No mass lesion, midline shift or mass effect. No hydrocephalus or
extra-axial fluid collection. Pituitary gland suprasellar region
within normal limits. Midline structures intact and normally formed.

Vascular: Major intracranial vascular flow voids are maintained.

Skull and upper cervical spine: Craniocervical junctional limits.
Bone marrow signal intensity within normal limits. No scalp soft
tissue abnormality.

Sinuses/Orbits: Globes and orbital soft tissues within normal
limits. Scattered polypoid mucosal thickening present throughout the
paranasal sinuses, most pronounced about the maxillary sinuses.
Small right mastoid effusion noted.

Other: None.
IMPRESSION: 1. No acute intracranial abnormality.
2. Mild chronic microvascular ischemic disease for age, with a few
small remote cerebellar infarcts.

## 2023-08-07 ENCOUNTER — Emergency Department (HOSPITAL_COMMUNITY)

## 2023-08-07 ENCOUNTER — Emergency Department (HOSPITAL_COMMUNITY)
Admission: EM | Admit: 2023-08-07 | Discharge: 2023-08-07 | Disposition: A | Discharge status: Home / Self Care | Attending: Emergency Medicine | Admitting: Emergency Medicine

## 2023-08-07 ENCOUNTER — Emergency Department (EMERGENCY_DEPARTMENT_HOSPITAL)

## 2023-08-07 ENCOUNTER — Encounter (HOSPITAL_COMMUNITY): Payer: Self-pay | Admitting: Emergency Medicine

## 2023-08-07 DIAGNOSIS — M79605 Pain in left leg: Secondary | ICD-10-CM | POA: Diagnosis not present

## 2023-08-07 DIAGNOSIS — Z8546 Personal history of malignant neoplasm of prostate: Secondary | ICD-10-CM | POA: Insufficient documentation

## 2023-08-07 DIAGNOSIS — I110 Hypertensive heart disease with heart failure: Secondary | ICD-10-CM | POA: Insufficient documentation

## 2023-08-07 DIAGNOSIS — Z79899 Other long term (current) drug therapy: Secondary | ICD-10-CM | POA: Insufficient documentation

## 2023-08-07 DIAGNOSIS — Z7982 Long term (current) use of aspirin: Secondary | ICD-10-CM | POA: Insufficient documentation

## 2023-08-07 DIAGNOSIS — I509 Heart failure, unspecified: Secondary | ICD-10-CM | POA: Diagnosis not present

## 2023-08-07 DIAGNOSIS — M25552 Pain in left hip: Secondary | ICD-10-CM | POA: Insufficient documentation

## 2023-08-07 DIAGNOSIS — F17210 Nicotine dependence, cigarettes, uncomplicated: Secondary | ICD-10-CM | POA: Insufficient documentation

## 2023-08-07 MED ORDER — ACETAMINOPHEN 500 MG PO TABS
1000.0000 mg | ORAL_TABLET | Freq: Once | ORAL | Status: AC
  Administered 2023-08-07: 1000 mg via ORAL
  Filled 2023-08-07: qty 2

## 2023-08-07 NOTE — Progress Notes (Signed)
 VASCULAR LAB    Left lower extremity venous duplex has been performed.  See CV proc for preliminary results.  Gave verbal report to Dr. Ignatius Makos, Anthonny Schiller, RVT 08/07/2023, 5:35 PM

## 2023-08-07 NOTE — ED Notes (Signed)
 Ultrasound at bedside

## 2023-08-07 NOTE — ED Notes (Signed)
 Patient given [place] sandwich and is sleeping at this time.

## 2023-08-07 NOTE — ED Triage Notes (Addendum)
 Pt bib [facility] for left hip pain from home.  100mcg fentanyl  given IV in route

## 2023-08-07 NOTE — ED Provider Notes (Signed)
 [place] [facility]GENCY DEPARTMENT AT [facility] Provider Note  CSN: [id number] Arrival date & time: 08/07/23 1224  Chief Complaint(s) Hip Pain  HPI Quantavius Humm Ureta is a 68 y.o. male history of cocaine use, CHF, hypertension presenting to the emergency department with left hip pain.  Patient reports left hip pain starting today.  Denies any trauma.  Reports that radiates down his leg.  Has pain in his calf as well.  Denies any recent travel or surgeries.  Denies any fall or injury.  No numbness or tingling.  No fevers or chills.  Denies similar episode.   Past Medical History Past Medical History:  Diagnosis Date   Aortic atherosclerosis (HCC) 12/29/2015   Aortic stenosis 12/30/2015   AVA around 1.3-1.4 cm2, trivial MR, normal LA   size, normal IVC.   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) 07/13/2022   Hypokalemia 07/12/2022   Crack cocaine use 07/11/2022   Acute on chronic diastolic heart failure (HCC) 07/11/2022   Acute respiratory failure (HCC) 07/10/2022   Weakness acquired in ICU 07/29/2021   Acute encephalopathy    Community acquired pneumonia    Hypernatremia 07/21/2021   ARDS (adult respiratory distress syndrome) (HCC) 07/20/2021   Acute on chronic respiratory failure with hypoxia and hypercapnia (HCC) 07/20/2021   Obesity (BMI 30-39.9) 07/20/2021   Type 2 diabetes mellitus with hyperglycemia (HCC) 07/20/2021   Aortic stenosis 12/30/2015   Aortic atherosclerosis (HCC) 12/29/2015   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 Authorizing Provider  acetaminophen  (TYLENOL ) 325 MG tablet Take 650 mg by mouth every 6 (six) hours as needed for mild pain.    [provider]  albuterol  (VENTOLIN  HFA) 108 (90 Base) MCG/ACT inhaler Inhale 2 puffs into the lungs every 4 (four) hours as needed for wheezing or shortness of breath.    [provider]  amLODipine  (NORVASC ) 10 MG tablet Take 1 tablet (10 mg total) by mouth daily. 08/01/21 07/13/22  Jackolyn Masker, MD  aspirin  EC 325 MG tablet Take 325 mg by mouth daily.    [provider]  atorvastatin  (LIPITOR) 80 MG tablet Take 40 mg by mouth daily.    [provider]  carboxymethylcellulose (REFRESH PLUS) 0.5 % SOLN Place 1 drop into both eyes 3 (three) times daily as needed.    [provider]  carvedilol  (COREG ) 6.25 MG tablet Take 6.25 mg by mouth 2 (two) times daily with a meal.    [provider]  cholecalciferol (VITAMIN D3) 25 MCG (1000 UNIT) tablet Take 3,000 Units by mouth daily.    [provider]  diclofenac  Sodium (VOLTAREN ) 1 % GEL Apply 2 g topically 4 (four) times daily as needed (foot pain).    [provider]  DULoxetine (CYMBALTA) 20 MG capsule Take 20 mg by mouth daily.    [provider]  fluticasone (FLONASE) 50 MCG/ACT nasal spray  Place 2 sprays into both nostrils daily.    [provider]  furosemide  (LASIX ) 20 MG tablet Please take 1 tablet by mouth twice daily x 2 days, then 1 tablet daily thereafter 07/23/22   Elgergawy, Dawood S, MD  guaiFENesin  (MUCINEX ) 600 MG 12 hr tablet Take 600 mg by mouth 2 (two) times daily as needed for cough.    [provider]  lidocaine  (XYLOCAINE ) 5 % ointment Apply 1 Application topically 2 (two) times daily as needed for mild pain or moderate pain.    [provider]  loratadine (CLARITIN) 10 MG tablet Take 10 mg by mouth daily.    [provider]  metFORMIN (GLUCOPHAGE)  500 MG tablet Take 500 mg by mouth 2 (two) times daily with a meal.    [provider]  nortriptyline  (PAMELOR ) 10 MG capsule Take 20 mg by mouth at bedtime.    [provider]  pantoprazole  (PROTONIX ) 40 MG tablet Take 1 tablet (40 mg total) by mouth daily. 07/23/22 08/22/22  Elgergawy, Ardia Kraft, MD  polyethylene glycol powder (GLYCOLAX /MIRALAX ) 17 GM/SCOOP powder Take 17 g by mouth 2 (two) times daily as needed for mild constipation.    [provider]  predniSONE  (DELTASONE ) 5 MG tablet Please take 2 tablets (10mg ) by mouth daily x 3 days, then 1 tablet (5 mg) daily x 3 days then stop. 07/23/22   Elgergawy, Ardia Kraft, MD  pregabalin  (LYRICA ) 200 MG capsule Take 1 capsule (200 mg total) by mouth 2 (two) times daily. 07/31/21 07/13/22  Jackolyn Masker, MD  tamsulosin  (FLOMAX ) 0.4 MG CAPS capsule Take 0.4 mg by mouth daily.    [provider]  valsartan  (DIOVAN ) 80 MG tablet Take 1 tablet (80 mg total) by mouth in the morning, at noon, and at bedtime. Patient taking differently: Take 240 mg by mouth daily. [facility] 07/31/21 07/13/22  Jackolyn Masker, MD                                                                                                                                    Past Surgical History Past Surgical History:  Procedure Laterality Date   BACK SURGERY     Family History Family History  Problem Relation Age of Onset   Cancer Mother    Cancer Father    Cancer Sister    Diabetes Mellitus II Brother     Social History Social History   Tobacco Use   Smoking status: Every Day    Types: Cigars   Smokeless tobacco: Never  Vaping Use   Vaping status: Never Used  Substance Use Topics   Alcohol use: Yes    Alcohol/week: 4.0 standard drinks of alcohol    Types: 4 Cans of beer per week   Drug use: Yes    Types: Marijuana   Allergies Patient has no known allergies.  Review of Systems Review of Systems  All other  systems reviewed and are  negative.   Physical Exam Vital Signs  I have reviewed the triage vital signs BP (!) 161/97   Pulse 77   Temp 97.9 F (36.6 C) (Oral)   Ht 5\' 5"  (1.651 m)   Wt 82 kg   SpO2 93%   BMI 30.08 kg/m  Physical Exam Vitals and nursing note reviewed.  Constitutional:      General: He is not in acute distress.    Appearance: Normal appearance.  HENT:     Mouth/Throat:     Mouth: Mucous membranes are moist.  Eyes:     Conjunctiva/sclera: Conjunctivae normal.  Cardiovascular:     Rate and Rhythm: Normal rate and regular rhythm.  Pulmonary:     Effort: Pulmonary effort is normal. No respiratory distress.     Breath sounds: Normal breath sounds.  Abdominal:     General: Abdomen is flat.     Palpations: Abdomen is soft.     Tenderness: There is no abdominal tenderness.  Musculoskeletal:     Right lower leg: No edema.     Left lower leg: No edema.     Comments: Full range of motion of the right hip, knee, ankle, does have some tenderness to the calf and over the left hip without any objective swelling or edema.  Distal pulses intact.  Skin:    General: Skin is warm and dry.     Capillary Refill: Capillary refill takes less than 2 seconds.  Neurological:     Mental Status: He is alert and oriented to person, place, and time. Mental status is at baseline.  Psychiatric:        Mood and Affect: Mood normal.        Behavior: Behavior normal.     ED Results and Treatments Labs (all labs ordered are listed, but only abnormal results are displayed) Labs Reviewed - No data to display                                                                                                                        Radiology VAS US  LOWER EXTREMITY VENOUS (DVT) (7a-7p) Result Date: 08/07/2023  Lower Venous DVT Study Patient Name:  QUATAVIOUS ROSSA  Date of Exam:   08/07/2023 Medical Rec #: [id number]         Accession #:    [id number] Date of Birth: 05-21-1955         Patient Gender: M Patient Age:   65  years Exam Location:  [facility] Procedure:      VAS US  LOWER EXTREMITY VENOUS (DVT) Referring Phys: Hiawatha Lout --------------------------------------------------------------------------------  Indications: Left hip pain radiating down lateral thigh.  Limitations: Patient positioning and pain with compression maneuvers. Comparison Study: No prior study on file Performing Technologist: Carleene Chase RVS  Examination Guidelines: A complete evaluation includes B-mode imaging, spectral Doppler, color Doppler, and power Doppler as needed of all accessible portions of each vessel. Bilateral testing is considered an  integral part of a complete examination. Limited examinations for reoccurring indications may be performed as noted. The reflux portion of the exam is performed with the patient in reverse Trendelenburg.  +-----+---------------+---------+-----------+----------+--------------+ RIGHTCompressibilityPhasicitySpontaneityPropertiesThrombus Aging +-----+---------------+---------+-----------+----------+--------------+ CFV  Full           Yes      Yes                                 +-----+---------------+---------+-----------+----------+--------------+ SFJ  Full                                                        +-----+---------------+---------+-----------+----------+--------------+   +---------+---------------+---------+-----------+----------+---------------+ LEFT     CompressibilityPhasicitySpontaneityPropertiesThrombus Aging  +---------+---------------+---------+-----------+----------+---------------+ CFV      Full           Yes      Yes                                  +---------+---------------+---------+-----------+----------+---------------+ SFJ      Full           Yes      Yes                                  +---------+---------------+---------+-----------+----------+---------------+ FV Prox  Full                                                          +---------+---------------+---------+-----------+----------+---------------+ FV Mid   Full                                                         +---------+---------------+---------+-----------+----------+---------------+ FV DistalFull                                                         +---------+---------------+---------+-----------+----------+---------------+ PFV      Full                                                         +---------+---------------+---------+-----------+----------+---------------+ POP      Full           Yes      Yes                                  +---------+---------------+---------+-----------+----------+---------------+ PTV      Full                                                         +---------+---------------+---------+-----------+----------+---------------+  PERO     Full                                                         +---------+---------------+---------+-----------+----------+---------------+ TP Trunk                                              patent by color +---------+---------------+---------+-----------+----------+---------------+     Summary: RIGHT: - No evidence of common femoral vein obstruction.   LEFT: - There is no evidence of deep vein thrombosis in the lower extremity.  - No cystic structure found in the popliteal fossa.  *See table(s) above for measurements and observations. Electronically signed by Jimmye Moulds MD on 08/07/2023 at 6:15:38 PM.    Final     Pertinent labs & imaging results that were available during my care of the patient were reviewed by me and considered in my medical decision making (see MDM for details).  Medications Ordered in ED Medications  acetaminophen  (TYLENOL ) tablet 1,000 mg (1,000 mg Oral Given 08/07/23 1440)                                                                                                                                      Procedures Procedures  (including critical care time)  Medical Decision Making / ED Course   MDM:  68 year old presenting to the emergency department with hip pain  Patient overall well-appearing.  Exam without signs of injury.  X-ray obtained with no signs of fracture, dislocation.  Denies back pain to suggest radicular symptoms.  Normal circulation.  Does have some calf tenderness, denies recent travel or surgery, history of DVT or PE, but will obtain ultrasound.  If this is negative anticipate discharge.  Clinical Course as of 08/08/23 1516  Fri Aug 07, 2023  1655 Signed out to Dr. Bryna Car pending DVT US . XR negative.  [WS]    Clinical Course User Index [WS] Mordecai Applebaum, MD     Additional history obtained: -Additional history obtained from ems -External records from outside source obtained and reviewed including: Chart review including previous notes, labs, imaging, consultation notes including prior notes    Imaging Studies ordered: I ordered imaging studies including XR leg On my interpretation imaging demonstrates no acute process I independently visualized and interpreted imaging. I agree with the radiologist interpretation   Medicines ordered and prescription drug management: Meds ordered this encounter  Medications   acetaminophen  (TYLENOL ) tablet 1,000 mg    -I have reviewed the patients home medicines and have made adjustments as needed  Social Determinants of Health:  Diagnosis or treatment significantly limited by social determinants  of health: obesity   Reevaluation: After the interventions noted above, I reevaluated the patient and found that their symptoms have improved  Co morbidities that complicate the patient evaluation  Past Medical History:  Diagnosis Date   Aortic atherosclerosis (HCC) 12/29/2015   Aortic stenosis 12/30/2015   AVA around 1.3-1.4 cm2, trivial MR, normal LA   size, normal IVC.   Back pain    Bulla of lung (HCC)  12/29/2015   Heart murmur    Heart valve disease    Hepatitis C    Hypertension    Hypokalemia    Pneumonia    Prediabetes 12/29/2015   Prostate cancer (HCC)    Tobacco abuse       Dispostion: Disposition decision including need for hospitalization was considered, and patient disposition pending at time of sign out.    Final Clinical Impression(s) / ED Diagnoses Final diagnoses:  Left hip pain     This chart was dictated using voice recognition software.  Despite best efforts to proofread,  errors can occur which can change the documentation meaning.    Mordecai Applebaum, MD 08/08/23 [phone]

## 2023-08-07 NOTE — Discharge Instructions (Addendum)
 [facility]
